# Patient Record
Sex: Female | Born: 1980
Health system: Southern US, Community
[De-identification: ages and names within clinical notes are randomized; demographics above are authoritative.]

## PROBLEM LIST (undated history)

## (undated) ENCOUNTER — Inpatient Hospital Stay (HOSPITAL_COMMUNITY): Payer: Self-pay

## (undated) DIAGNOSIS — K760 Fatty (change of) liver, not elsewhere classified: Secondary | ICD-10-CM

## (undated) DIAGNOSIS — O169 Unspecified maternal hypertension, unspecified trimester: Secondary | ICD-10-CM

## (undated) DIAGNOSIS — E119 Type 2 diabetes mellitus without complications: Secondary | ICD-10-CM

## (undated) DIAGNOSIS — U071 COVID-19: Secondary | ICD-10-CM

## (undated) DIAGNOSIS — N12 Tubulo-interstitial nephritis, not specified as acute or chronic: Secondary | ICD-10-CM

## (undated) DIAGNOSIS — J1282 Pneumonia due to coronavirus disease 2019: Secondary | ICD-10-CM

## (undated) DIAGNOSIS — Z86718 Personal history of other venous thrombosis and embolism: Secondary | ICD-10-CM

## (undated) DIAGNOSIS — B9620 Unspecified Escherichia coli [E. coli] as the cause of diseases classified elsewhere: Secondary | ICD-10-CM

## (undated) DIAGNOSIS — I10 Essential (primary) hypertension: Secondary | ICD-10-CM

## (undated) DIAGNOSIS — E1110 Type 2 diabetes mellitus with ketoacidosis without coma: Secondary | ICD-10-CM

## (undated) DIAGNOSIS — R7881 Bacteremia: Secondary | ICD-10-CM

## (undated) DIAGNOSIS — B370 Candidal stomatitis: Secondary | ICD-10-CM

## (undated) DIAGNOSIS — I82461 Acute embolism and thrombosis of right calf muscular vein: Secondary | ICD-10-CM

## (undated) HISTORY — DX: Fatty (change of) liver, not elsewhere classified: K76.0

## (undated) HISTORY — DX: Type 2 diabetes mellitus with ketoacidosis without coma: E11.10

## (undated) HISTORY — PX: NO PAST SURGERIES: SHX2092

## (undated) HISTORY — DX: Acute embolism and thrombosis of right calf muscular vein: I82.461

## (undated) HISTORY — DX: Pneumonia due to coronavirus disease 2019: J12.82

## (undated) HISTORY — DX: Tubulo-interstitial nephritis, not specified as acute or chronic: N12

## (undated) HISTORY — DX: Bacteremia: B96.20

## (undated) HISTORY — DX: Unspecified maternal hypertension, unspecified trimester: O16.9

## (undated) HISTORY — DX: COVID-19: U07.1

## (undated) HISTORY — DX: Personal history of other venous thrombosis and embolism: Z86.718

## (undated) HISTORY — DX: Essential (primary) hypertension: I10

## (undated) HISTORY — DX: Candidal stomatitis: B37.0

## (undated) HISTORY — DX: Bacteremia: R78.81

---

## 2010-06-29 ENCOUNTER — Emergency Department (HOSPITAL_COMMUNITY)
Admission: EM | Admit: 2010-06-29 | Discharge: 2010-06-30 | Payer: Self-pay | Source: Home / Self Care | Admitting: Emergency Medicine

## 2010-09-24 LAB — DIFFERENTIAL
Basophils Absolute: 0 10*3/uL (ref 0.0–0.1)
Basophils Relative: 0 % (ref 0–1)
Eosinophils Absolute: 0.4 10*3/uL (ref 0.0–0.7)
Eosinophils Relative: 4 % (ref 0–5)
Lymphocytes Relative: 24 % (ref 12–46)
Lymphs Abs: 2 10*3/uL (ref 0.7–4.0)
Monocytes Absolute: 0.6 10*3/uL (ref 0.1–1.0)
Monocytes Relative: 7 % (ref 3–12)
Neutro Abs: 5.4 10*3/uL (ref 1.7–7.7)
Neutrophils Relative %: 64 % (ref 43–77)

## 2010-09-24 LAB — CBC
HCT: 38.2 % (ref 36.0–46.0)
Hemoglobin: 12.7 g/dL (ref 12.0–15.0)
MCH: 29.3 pg (ref 26.0–34.0)
MCHC: 33.2 g/dL (ref 30.0–36.0)
MCV: 88 fL (ref 78.0–100.0)
Platelets: 206 10*3/uL (ref 150–400)
RBC: 4.34 MIL/uL (ref 3.87–5.11)
RDW: 12.6 % (ref 11.5–15.5)
WBC: 8.3 10*3/uL (ref 4.0–10.5)

## 2010-09-24 LAB — URINALYSIS, ROUTINE W REFLEX MICROSCOPIC
Bilirubin Urine: NEGATIVE
Glucose, UA: NEGATIVE mg/dL
Hgb urine dipstick: NEGATIVE
Ketones, ur: NEGATIVE mg/dL
Nitrite: NEGATIVE
Protein, ur: NEGATIVE mg/dL
Specific Gravity, Urine: 1.02 (ref 1.005–1.030)
Urobilinogen, UA: 0.2 mg/dL (ref 0.0–1.0)
pH: 6.5 (ref 5.0–8.0)

## 2010-09-24 LAB — POCT I-STAT, CHEM 8
BUN: 20 mg/dL (ref 6–23)
Calcium, Ion: 1.16 mmol/L (ref 1.12–1.32)
Chloride: 105 mEq/L (ref 96–112)
Creatinine, Ser: 0.8 mg/dL (ref 0.4–1.2)
Glucose, Bld: 114 mg/dL — ABNORMAL HIGH (ref 70–99)
HCT: 38 % (ref 36.0–46.0)
Hemoglobin: 12.9 g/dL (ref 12.0–15.0)
Potassium: 3.6 mEq/L (ref 3.5–5.1)
Sodium: 142 mEq/L (ref 135–145)
TCO2: 30 mmol/L (ref 0–100)

## 2010-09-24 LAB — LIPASE, BLOOD: Lipase: 39 U/L (ref 11–59)

## 2010-09-24 LAB — HEPATIC FUNCTION PANEL
ALT: 28 U/L (ref 0–35)
AST: 43 U/L — ABNORMAL HIGH (ref 0–37)
Albumin: 3.8 g/dL (ref 3.5–5.2)
Alkaline Phosphatase: 63 U/L (ref 39–117)
Bilirubin, Direct: 0.1 mg/dL (ref 0.0–0.3)
Indirect Bilirubin: 0.4 mg/dL (ref 0.3–0.9)
Total Bilirubin: 0.5 mg/dL (ref 0.3–1.2)
Total Protein: 6.6 g/dL (ref 6.0–8.3)

## 2010-09-24 LAB — POCT PREGNANCY, URINE: Preg Test, Ur: NEGATIVE

## 2011-07-16 NOTE — L&D Delivery Note (Signed)
Delivery Note At 4:02 PM a viable female was delivered via Vaginal, Spontaneous Delivery (Presentation: ; Occiput Anterior).  APGAR: 8, 9; weight 3305 g.   Placenta status: Intact, Spontaneous.  Cord: 3 vessels with the following complications: nuchal cord x 1 - delivered through and reduced after delivery.    Anesthesia: Epidural  Episiotomy: None Lacerations: 2nd degree Suture Repair: 3.0 vicryl Est. Blood Loss (mL): 400  Mom to postpartum.  Baby to nursery-stable.  Mat Carne 02/19/2012, 4:45 PM

## 2011-07-16 NOTE — L&D Delivery Note (Signed)
I supervised Dr Clinton Sawyer during this delivery.  I agree with the above note.  Levie Heritage, DO 02/20/2012 7:54 PM

## 2011-07-18 ENCOUNTER — Other Ambulatory Visit: Payer: Self-pay

## 2011-07-18 DIAGNOSIS — Z331 Pregnant state, incidental: Secondary | ICD-10-CM

## 2011-07-18 LAB — HIV ANTIBODY (ROUTINE TESTING W REFLEX): HIV: NONREACTIVE

## 2011-07-18 NOTE — Progress Notes (Signed)
Prenatal labs done today [employer] 

## 2011-07-19 LAB — OBSTETRIC PANEL
Antibody Screen: NEGATIVE
Basophils Absolute: 0 10*3/uL (ref 0.0–0.1)
Basophils Relative: 0 % (ref 0–1)
Eosinophils Absolute: 0.1 10*3/uL (ref 0.0–0.7)
Eosinophils Relative: 1 % (ref 0–5)
HCT: 42.2 % (ref 36.0–46.0)
Hemoglobin: 13.7 g/dL (ref 12.0–15.0)
Hepatitis B Surface Ag: NEGATIVE
Lymphocytes Relative: 32 % (ref 12–46)
Lymphs Abs: 3 10*3/uL (ref 0.7–4.0)
MCH: 29 pg (ref 26.0–34.0)
MCHC: 32.5 g/dL (ref 30.0–36.0)
MCV: 89.2 fL (ref 78.0–100.0)
Monocytes Absolute: 0.7 10*3/uL (ref 0.1–1.0)
Monocytes Relative: 8 % (ref 3–12)
Neutro Abs: 5.5 10*3/uL (ref 1.7–7.7)
Neutrophils Relative %: 59 % (ref 43–77)
Platelets: 216 10*3/uL (ref 150–400)
RBC: 4.73 MIL/uL (ref 3.87–5.11)
RDW: 13 % (ref 11.5–15.5)
Rh Type: POSITIVE
Rubella: 42.8 IU/mL — ABNORMAL HIGH
WBC: 9.4 10*3/uL (ref 4.0–10.5)

## 2011-07-19 LAB — SICKLE CELL SCREEN: Sickle Cell Screen: NEGATIVE

## 2011-07-21 LAB — CULTURE, OB URINE: Colony Count: 25000

## 2011-07-25 ENCOUNTER — Encounter: Payer: Self-pay | Admitting: Family Medicine

## 2011-07-25 ENCOUNTER — Ambulatory Visit (INDEPENDENT_AMBULATORY_CARE_PROVIDER_SITE_OTHER): Payer: Self-pay | Admitting: Family Medicine

## 2011-07-25 VITALS — BP 130/82 | Wt 238.0 lb

## 2011-07-25 DIAGNOSIS — Z331 Pregnant state, incidental: Secondary | ICD-10-CM

## 2011-07-25 DIAGNOSIS — E039 Hypothyroidism, unspecified: Secondary | ICD-10-CM

## 2011-07-25 DIAGNOSIS — Z3480 Encounter for supervision of other normal pregnancy, unspecified trimester: Secondary | ICD-10-CM

## 2011-07-25 LAB — T3, FREE: T3, Free: 2.6 pg/mL (ref 2.3–4.2)

## 2011-07-25 LAB — TSH: TSH: 1.698 u[IU]/mL (ref 0.350–4.500)

## 2011-07-26 LAB — GC/CHLAMYDIA PROBE AMP, GENITAL
Chlamydia, DNA Probe: NEGATIVE
GC Probe Amp, Genital: NEGATIVE

## 2011-08-07 ENCOUNTER — Encounter: Payer: Self-pay | Admitting: Family Medicine

## 2011-08-07 DIAGNOSIS — E669 Obesity, unspecified: Secondary | ICD-10-CM | POA: Insufficient documentation

## 2011-08-07 NOTE — Progress Notes (Signed)
CC: initial pre-natal visit  HPI:  Learned of pregnancy on December 5th, patient very excited about pregnancy PE:  Gen - alert, oriented, very pleasant  Cardiac - RRR  Lungs - CTA  Abdomen - obese, not discernible gravid, non-tender, uterine fundus not palpable  Plan  1. Hypothyroidism - check TSH, T3/T4 today to make sure on correct dose of levothyroxine   2. Pregnancy - 1st trimester, follow-up with OB clinic in 4 weeks  3. Obesity - needs early glucola screening at next visit

## 2011-08-15 ENCOUNTER — Encounter (HOSPITAL_COMMUNITY): Payer: Self-pay | Admitting: *Deleted

## 2011-08-15 ENCOUNTER — Inpatient Hospital Stay (HOSPITAL_COMMUNITY): Payer: Self-pay

## 2011-08-15 ENCOUNTER — Inpatient Hospital Stay (HOSPITAL_COMMUNITY)
Admission: AD | Admit: 2011-08-15 | Discharge: 2011-08-15 | Disposition: A | Discharge status: Home / Self Care | Payer: Self-pay | Source: Ambulatory Visit | Attending: Obstetrics and Gynecology | Admitting: Obstetrics and Gynecology

## 2011-08-15 DIAGNOSIS — O209 Hemorrhage in early pregnancy, unspecified: Secondary | ICD-10-CM | POA: Insufficient documentation

## 2011-08-15 LAB — URINE MICROSCOPIC-ADD ON

## 2011-08-15 LAB — URINALYSIS, ROUTINE W REFLEX MICROSCOPIC
Bilirubin Urine: NEGATIVE
Glucose, UA: NEGATIVE mg/dL
Ketones, ur: NEGATIVE mg/dL
Leukocytes, UA: NEGATIVE
Nitrite: NEGATIVE
Protein, ur: NEGATIVE mg/dL
Specific Gravity, Urine: 1.015 (ref 1.005–1.030)
Urobilinogen, UA: 0.2 mg/dL (ref 0.0–1.0)
pH: 6 (ref 5.0–8.0)

## 2011-08-15 LAB — WET PREP, GENITAL
Clue Cells Wet Prep HPF POC: NONE SEEN
Trich, Wet Prep: NONE SEEN
WBC, Wet Prep HPF POC: NONE SEEN
Yeast Wet Prep HPF POC: NONE SEEN

## 2011-08-15 NOTE — Progress Notes (Signed)
SSE per CNM.  Wet prep and cultures collected.  VE done.  

## 2011-08-15 NOTE — Progress Notes (Signed)
N. Frazier, CNM at bedside.  Assessment done and poc discussed with pt.  

## 2011-08-15 NOTE — Progress Notes (Signed)
Per interpreter, pt reports she had vaginal bleeding  Earlier today. Now having lower back pain. EDD 02/20/2012

## 2011-08-15 NOTE — ED Provider Notes (Signed)
History     No chief complaint on file.  HPI 31 y.o. G2P0010 at [redacted]w[redacted]d c/o vaginal bleeding like a period starting today, mild low back pain. Prenatal care at [facility]. H/O SAB at 8 weeks.    Past Medical History  Diagnosis Date  . Hypothyroidism     No past surgical history on file.  No family history on file.  History  Substance Use Topics  . Smoking status: Not on file  . Smokeless tobacco: Not on file  . Alcohol Use:     Allergies: Allergies not on file  Prescriptions prior to admission  Medication Sig Dispense Refill  . levothyroxine (SYNTHROID, LEVOTHROID) 25 MCG tablet Take 25 mcg by mouth daily.        Review of Systems  Constitutional: Negative.   Respiratory: Negative.   Cardiovascular: Negative.   Gastrointestinal: Negative for nausea, vomiting, abdominal pain, diarrhea and constipation.  Genitourinary: Negative for dysuria, urgency, frequency, hematuria and flank pain.       Positive for vaginal bleeding  Musculoskeletal: Positive for back pain.  Neurological: Negative.   Psychiatric/Behavioral: Negative.    Physical Exam   Last menstrual period 05/12/2011.  Physical Exam  Nursing note and vitals reviewed. Constitutional: She is oriented to person, place, and time. She appears well-developed and well-nourished. No distress.  HENT:  Head: Normocephalic and atraumatic.  Cardiovascular: Normal rate, regular rhythm and normal heart sounds.   Respiratory: Effort normal and breath sounds normal. No respiratory distress.  GI: Soft. Bowel sounds are normal. She exhibits no distension and no mass. There is no tenderness. There is no rebound and no guarding.  Genitourinary: There is no rash or lesion on the right labia. There is no rash or lesion on the left labia. Uterus is not deviated, not enlarged, not fixed and not tender. Cervix exhibits no motion tenderness, no discharge and no friability. Right adnexum displays no mass, no tenderness and no fullness. Left  adnexum displays no mass, no tenderness and no fullness. There is bleeding (small clot, no active bleeding) around the vagina. No erythema or tenderness around the vagina. No vaginal discharge found.       Cervix closed and firm  Neurological: She is alert and oriented to person, place, and time.  Skin: Skin is warm and dry.  Psychiatric: She has a normal mood and affect.    MAU Course  Procedures  Results for orders placed during the hospital encounter of 08/15/11 (from the past 24 hour(s))  URINALYSIS, ROUTINE W REFLEX MICROSCOPIC     Status: Abnormal   Collection Time   08/15/11  1:25 AM      Component Value Range   Color, Urine YELLOW  YELLOW    APPearance CLEAR  CLEAR    Specific Gravity, Urine 1.015  1.005 - 1.030    pH 6.0  5.0 - 8.0    Glucose, UA NEGATIVE  NEGATIVE (mg/dL)   Hgb urine dipstick LARGE (*) NEGATIVE    Bilirubin Urine NEGATIVE  NEGATIVE    Ketones, ur NEGATIVE  NEGATIVE (mg/dL)   Protein, ur NEGATIVE  NEGATIVE (mg/dL)   Urobilinogen, UA 0.2  0.0 - 1.0 (mg/dL)   Nitrite NEGATIVE  NEGATIVE    Leukocytes, UA NEGATIVE  NEGATIVE   WET PREP, GENITAL     Status: Normal   Collection Time   08/15/11  1:25 AM      Component Value Range   Yeast Wet Prep HPF POC NONE SEEN  NONE SEEN  Trich, Wet Prep NONE SEEN  NONE SEEN    Clue Cells Wet Prep HPF POC NONE SEEN  NONE SEEN    WBC, Wet Prep HPF POC NONE SEEN  NONE SEEN   URINE MICROSCOPIC-ADD ON     Status: Normal   Collection Time   08/15/11  1:25 AM      Component Value Range   Squamous Epithelial / LPF RARE  RARE    WBC, UA 0-2  <3 (WBC/hpf)   RBC / HPF 11-20  <3 (RBC/hpf)   US Ob Comp Less 14 Wks  08/15/2011  *RADIOLOGY REPORT*  Clinical Data: Pregnant, bleeding, 13 weeks 4 days EGA by LMP  OBSTETRIC <14 WK ULTRASOUND  Technique:  Transabdominal ultrasound was performed for evaluation of the gestation as well as the maternal uterus and adnexal regions.  Comparison:  None  Intrauterine gestational sac:  Visualized/normal in shape. Yolk sac: Not identified Embryo: Present Cardiac Activity: Present Heart Rate: 142 bpm  CRL:  5.5 cm mm    12 w   1 d       [place] EDC: 02/26/2012  Maternal uterus/Adnexae: No subchorionic hemorrhage. Ovaries were not visualized, question related to position or obscuration by bowel. No free fluid or adnexal masses noted.  IMPRESSION: Single live intrauterine gestation measured at 12 weeks 1 day EGA by crown-rump length. No acute abnormalities.  Original Report Authenticated By: Lollie Marrow, M.D.    Assessment and Plan  30 y.o. G2P0010 at [redacted]w[redacted]d Bleeding in early pregnancy  Precautions rev'd F/U as scheduled   Anelis Hrivnak 08/15/2011, 12:57 AM

## 2011-08-16 LAB — GC/CHLAMYDIA PROBE AMP, GENITAL
Chlamydia, DNA Probe: NEGATIVE
GC Probe Amp, Genital: NEGATIVE

## 2011-08-16 NOTE — ED Provider Notes (Signed)
Attestation of Attending Supervision of Advanced Practitioner: Evaluation and management procedures were performed by the PA/NP/CNM/OB Fellow under my supervision/collaboration. Chart reviewed and agree with management and plan.  Orli Degrave V 08/16/2011 9:09 AM

## 2011-08-29 ENCOUNTER — Ambulatory Visit (INDEPENDENT_AMBULATORY_CARE_PROVIDER_SITE_OTHER): Payer: Self-pay | Admitting: Family Medicine

## 2011-08-29 ENCOUNTER — Encounter: Payer: Self-pay | Admitting: Family Medicine

## 2011-08-29 VITALS — BP 118/84 | Temp 99.0°F | Wt 237.0 lb

## 2011-08-29 DIAGNOSIS — Z3480 Encounter for supervision of other normal pregnancy, unspecified trimester: Secondary | ICD-10-CM

## 2011-08-29 DIAGNOSIS — Z23 Encounter for immunization: Secondary | ICD-10-CM

## 2011-08-29 DIAGNOSIS — Z331 Pregnant state, incidental: Secondary | ICD-10-CM

## 2011-08-29 LAB — GLUCOSE, CAPILLARY: Glucose-Capillary: 140 mg/dL — ABNORMAL HIGH (ref 70–99)

## 2011-08-29 NOTE — Patient Instructions (Signed)
Everything looks good today. Please let us know if you get the orange card. If you have bad pain, if you have bleeding, or if you have lots of vomiting, please call us. Let us know if you have any questions.  Todo se ve bien en la actualidad. Por favor, hganos saber si usted recibe la tarjeta naranja. Si usted tiene [employer], si tiene sangrado, o si usted tiene un montn de vmito, por favor llmenos. Hganos saber si usted tiene alguna pregunta.

## 2011-08-29 NOTE — Progress Notes (Signed)
30 G2P0 here at 14 1/7 (redated today by her 12 week sono) for routine OB visit.  She c/o mild nausea that is resolved with eating.  She reports she had one episode of bleeding and went to the MAU.  Everything was fine, no bleeding since then.   See flow sheet for details.  FHT not auscultated with doppler, but fetal movement and cardiac activity observed with bedside sono.   A/P: Pregnancy - now doing well.  No further bleeding.  Pain, bleeding precautions reviewed.  Will need anatomy scan scheduled for around 19 weeks.  She will apply for orange card and let us know the outcome. Flu shot today. Nausea - small frequent meals.  Has lost 1 lb, will need to follow.  Weight gain goal only 15 lbs for [name] Glucose intolerance - 1 hour 140 today.  Will need 3 hour.  Has scheduled for 09/02/11. Obesity - weight gain goal 15 lbs.   Hypothryoid - TSH at goal.  Continue current dose.  Check TSH next visit. Follow up 4 weeks with Dr. Clinton Sawyer.

## 2011-09-02 ENCOUNTER — Other Ambulatory Visit: Payer: Self-pay

## 2011-09-02 DIAGNOSIS — Z331 Pregnant state, incidental: Secondary | ICD-10-CM

## 2011-09-02 LAB — GLUCOSE, CAPILLARY: Glucose-Capillary: 94 mg/dL (ref 70–99)

## 2011-09-02 NOTE — Progress Notes (Signed)
3 HR GTT DONE TODAY Tonya Simmons 

## 2011-09-03 LAB — GLUCOSE TOLERANCE, 3 HOURS
Glucose Tolerance, 1 hour: 177 mg/dL (ref 70–189)
Glucose Tolerance, 2 hour: 151 mg/dL (ref 70–164)
Glucose Tolerance, Fasting: 85 mg/dL (ref 70–104)
Glucose, GTT - 3 Hour: 112 mg/dL (ref 70–144)

## 2011-09-23 ENCOUNTER — Telehealth: Payer: Self-pay | Admitting: Family Medicine

## 2011-09-23 NOTE — Telephone Encounter (Signed)
Pt called to let us know that she has OC. Pt need [place] appt.  [employer]

## 2011-09-30 ENCOUNTER — Ambulatory Visit (INDEPENDENT_AMBULATORY_CARE_PROVIDER_SITE_OTHER): Payer: Self-pay | Admitting: Family Medicine

## 2011-09-30 VITALS — BP 117/89 | Wt 241.0 lb

## 2011-09-30 DIAGNOSIS — Z3480 Encounter for supervision of other normal pregnancy, unspecified trimester: Secondary | ICD-10-CM

## 2011-09-30 DIAGNOSIS — E039 Hypothyroidism, unspecified: Secondary | ICD-10-CM

## 2011-09-30 NOTE — Progress Notes (Signed)
Addended by: Garnetta Buddy on: 09/30/2011 06:03 PM   Modules accepted: Level of Service

## 2011-09-30 NOTE — Progress Notes (Signed)
31 yr old G39P0 @ 18.5 weeks (dated by 12 week sono) for routine OB visit.  S: Complains of pain right lower quadrant - moderate, exacerbated by sitting and standing up, relapses and remits, has not taken an analgesic; denies bleeding, vaginal discharge, and contractions; Asked about blood sugar and shared with her that results from screen were normal ; O: BP 117/89  Wt 241 lb (109.317 kg)  LMP 05/12/2011  Gen: alert, oriented, non-distressed  Abdomen: obese, fundal height 19 cm, FHT not auscultated with doppler, so bedside sono that demonstrated fetal movement and cardiac activity A/P: 31 y.o. G2P0 at 18.[redacted] week EGA with complications of obesity and hypothyroidism, but otherwise healthy - TSH checked today, adjust medication accordingly - Obesity - 3 lb weight gain since initial prenatal visit - Scheduled complete u/s for 3/22 at [facility] hospital - f/u 4 weeks

## 2011-10-01 ENCOUNTER — Encounter: Payer: Self-pay | Admitting: Family Medicine

## 2011-10-01 LAB — TSH: TSH: 2.008 u[IU]/mL (ref 0.350–4.500)

## 2011-10-04 ENCOUNTER — Ambulatory Visit (HOSPITAL_COMMUNITY)
Admission: RE | Admit: 2011-10-04 | Discharge: 2011-10-04 | Disposition: A | Discharge status: Home / Self Care | Payer: Self-pay | Source: Ambulatory Visit | Attending: Family Medicine | Admitting: Family Medicine

## 2011-10-04 DIAGNOSIS — Z3689 Encounter for other specified antenatal screening: Secondary | ICD-10-CM | POA: Insufficient documentation

## 2011-10-04 DIAGNOSIS — E669 Obesity, unspecified: Secondary | ICD-10-CM | POA: Insufficient documentation

## 2011-10-04 DIAGNOSIS — Z3480 Encounter for supervision of other normal pregnancy, unspecified trimester: Secondary | ICD-10-CM

## 2011-10-04 DIAGNOSIS — O99210 Obesity complicating pregnancy, unspecified trimester: Secondary | ICD-10-CM | POA: Insufficient documentation

## 2011-10-31 ENCOUNTER — Ambulatory Visit (INDEPENDENT_AMBULATORY_CARE_PROVIDER_SITE_OTHER): Payer: Self-pay | Admitting: Family Medicine

## 2011-10-31 VITALS — BP 140/95 | Wt 245.5 lb

## 2011-10-31 DIAGNOSIS — IMO0002 Reserved for concepts with insufficient information to code with codable children: Secondary | ICD-10-CM

## 2011-10-31 DIAGNOSIS — O139 Gestational [pregnancy-induced] hypertension without significant proteinuria, unspecified trimester: Secondary | ICD-10-CM

## 2011-10-31 DIAGNOSIS — Z3480 Encounter for supervision of other normal pregnancy, unspecified trimester: Secondary | ICD-10-CM

## 2011-10-31 LAB — POCT URINALYSIS DIPSTICK
Bilirubin, UA: NEGATIVE
Blood, UA: NEGATIVE
Glucose, UA: NEGATIVE
Ketones, UA: NEGATIVE
Leukocytes, UA: NEGATIVE
Nitrite, UA: NEGATIVE
Protein, UA: NEGATIVE
Spec Grav, UA: 1.01
Urobilinogen, UA: 0.2
pH, UA: 7

## 2011-10-31 LAB — CBC
HCT: 36.6 % (ref 36.0–46.0)
Hemoglobin: 12.2 g/dL (ref 12.0–15.0)
MCH: 30 pg (ref 26.0–34.0)
MCHC: 33.3 g/dL (ref 30.0–36.0)
MCV: 90.1 fL (ref 78.0–100.0)
Platelets: 202 10*3/uL (ref 150–400)
RBC: 4.06 MIL/uL (ref 3.87–5.11)
RDW: 13.5 % (ref 11.5–15.5)
WBC: 9.6 10*3/uL (ref 4.0–10.5)

## 2011-10-31 NOTE — Progress Notes (Signed)
31 year old G2P0 @ 23.1 weeks (dated by 12 week sono) for routine OB visit. Sex female on 18 week u/s.  S: no complaints; denies vag bleeding and contractions; feels baby move a lot; denies headache, blurry vision, RUQ pain O: BP 140/95  Wt 245 lb 8 oz (111.358 kg)  LMP 05/12/2011 Elevated BP initially; rechecked and 130/84 bilaterally  Gen: alert, non-distressed  Lungs: CTA-B  Cardiac: RRR  Abd: obese, gravid  Neuro: PERRLA, EOMI, normal DTR  Extremities: no edema A/P: 31 year old G2P0 @ 64 and 1/7 weeks obese female with elevated BP, who needs a r/o for pre-eclampsia.   - Check CBC, CMP, 24 hours urine, and TSH  - F/u in 1 week for rpt BP check

## 2011-11-01 ENCOUNTER — Other Ambulatory Visit: Payer: Self-pay

## 2011-11-01 DIAGNOSIS — O139 Gestational [pregnancy-induced] hypertension without significant proteinuria, unspecified trimester: Secondary | ICD-10-CM

## 2011-11-01 LAB — COMPLETE METABOLIC PANEL WITH GFR
ALT: 25 U/L (ref 0–35)
AST: 18 U/L (ref 0–37)
Albumin: 4.1 g/dL (ref 3.5–5.2)
Alkaline Phosphatase: 49 U/L (ref 39–117)
BUN: 7 mg/dL (ref 6–23)
CO2: 22 mEq/L (ref 19–32)
Calcium: 9.4 mg/dL (ref 8.4–10.5)
Chloride: 106 mEq/L (ref 96–112)
Creat: 0.61 mg/dL (ref 0.50–1.10)
GFR, Est African American: >89 mL/min
GFR, Est Non African American: >89 mL/min
Glucose, Bld: 82 mg/dL (ref 70–99)
Potassium: 4 mEq/L (ref 3.5–5.3)
Sodium: 138 mEq/L (ref 135–145)
Total Bilirubin: 0.2 mg/dL — ABNORMAL LOW (ref 0.3–1.2)
Total Protein: 6.7 g/dL (ref 6.0–8.3)

## 2011-11-01 LAB — TSH: TSH: 2.125 u[IU]/mL (ref 0.350–4.500)

## 2011-11-02 LAB — PROTEIN, URINE, 24 HOUR: Protein, Urine: <3 mg/dL

## 2011-11-04 ENCOUNTER — Telehealth: Payer: Self-pay | Admitting: Family Medicine

## 2011-11-04 NOTE — Telephone Encounter (Signed)
I spoke with the patient and informed her that her test results were normal. She has had no problems with headache, changes in vision, or abdominal pain this week. She will return to clinic on Wednesday for a blood pressure check.

## 2011-11-06 ENCOUNTER — Ambulatory Visit (INDEPENDENT_AMBULATORY_CARE_PROVIDER_SITE_OTHER): Payer: Self-pay | Admitting: Family Medicine

## 2011-11-06 DIAGNOSIS — Z3480 Encounter for supervision of other normal pregnancy, unspecified trimester: Secondary | ICD-10-CM

## 2011-11-06 NOTE — Patient Instructions (Signed)
Por favor regrese en 2 semanas para chequear su presion.

## 2011-11-06 NOTE — Progress Notes (Signed)
31 year old G2P0 @ 24.0 weeks (dated by 12 week sono) for f/u of BP. S: patient denies headache, blurry vision, abdominal pain, edema O: BP 126/93  Temp 98.1 F (36.7 C)  Wt 244 lb (110.678 kg)  LMP 05/12/2011 - Fetal HR 136Gen: alert, non-distressed  Lungs: CTA-B  Cardiac: RRR  Abd: obese, gravid  Neuro: PERRLA, EOMI, normal DTR  Extremities: no edema  CMP within normal limits; CBC within normal limits; 24 hour urine protein < 50 mg/day; TSH WNL  A/P: 31 y.o. G3P0 at [redacted] weeks EGA who has borderline elevated BP, but no pre-eclampsia.  - F/u in 2 weeks for repeat BP check - Check TSH at 28 week visit along with TSH, RPR, HIV, CBC

## 2011-11-21 ENCOUNTER — Encounter: Payer: Self-pay | Admitting: Family Medicine

## 2011-11-28 ENCOUNTER — Ambulatory Visit (INDEPENDENT_AMBULATORY_CARE_PROVIDER_SITE_OTHER): Payer: Self-pay | Admitting: Family Medicine

## 2011-11-28 VITALS — BP 122/94 | Wt 245.0 lb

## 2011-11-28 DIAGNOSIS — Z3480 Encounter for supervision of other normal pregnancy, unspecified trimester: Secondary | ICD-10-CM

## 2011-11-28 DIAGNOSIS — Z331 Pregnant state, incidental: Secondary | ICD-10-CM

## 2011-11-28 DIAGNOSIS — E039 Hypothyroidism, unspecified: Secondary | ICD-10-CM

## 2011-11-28 LAB — HIV ANTIBODY (ROUTINE TESTING W REFLEX): HIV: NONREACTIVE

## 2011-11-28 LAB — CBC
HCT: 38 % (ref 36.0–46.0)
Hemoglobin: 12.6 g/dL (ref 12.0–15.0)
MCH: 29.8 pg (ref 26.0–34.0)
MCHC: 33.2 g/dL (ref 30.0–36.0)
MCV: 89.8 fL (ref 78.0–100.0)
Platelets: 202 10*3/uL (ref 150–400)
RBC: 4.23 MIL/uL (ref 3.87–5.11)
RDW: 13.5 % (ref 11.5–15.5)
WBC: 8.8 10*3/uL (ref 4.0–10.5)

## 2011-11-28 LAB — RPR

## 2011-11-28 LAB — TSH: TSH: 1.394 u[IU]/mL (ref 0.350–4.500)

## 2011-11-28 NOTE — Patient Instructions (Signed)
It was good to meet you today  We will check some blood work today  I would also like to refer you to the high risk OB clinic If you develop any severe abdominal pain, vaginal bleeding, decreased fetal movement, or any other concerning symptoms, [facility]  Call if any questions,  God Bless,  Doree Albee MD

## 2011-11-28 NOTE — Progress Notes (Signed)
30 YO G2P1 here at 27 and 1 weeks here for routine prenatal visit. No acute issue or concern.  Plan:   TSH, RPR, CBC, HIV Will formally refer to high risk given multiple risk factors including hypoTSH and ? Gestational HTN.  Case reviewed with Dr. Jolayne Panther and Dr. Leveda Anna.

## 2011-12-11 ENCOUNTER — Ambulatory Visit (INDEPENDENT_AMBULATORY_CARE_PROVIDER_SITE_OTHER): Payer: Self-pay | Admitting: Family Medicine

## 2011-12-11 ENCOUNTER — Encounter: Payer: Self-pay | Admitting: Family Medicine

## 2011-12-11 ENCOUNTER — Other Ambulatory Visit: Payer: Self-pay | Admitting: Family Medicine

## 2011-12-11 DIAGNOSIS — E079 Disorder of thyroid, unspecified: Secondary | ICD-10-CM

## 2011-12-11 DIAGNOSIS — O169 Unspecified maternal hypertension, unspecified trimester: Secondary | ICD-10-CM | POA: Insufficient documentation

## 2011-12-11 DIAGNOSIS — Z8639 Personal history of other endocrine, nutritional and metabolic disease: Secondary | ICD-10-CM | POA: Insufficient documentation

## 2011-12-11 DIAGNOSIS — O139 Gestational [pregnancy-induced] hypertension without significant proteinuria, unspecified trimester: Secondary | ICD-10-CM

## 2011-12-11 DIAGNOSIS — O99280 Endocrine, nutritional and metabolic diseases complicating pregnancy, unspecified trimester: Secondary | ICD-10-CM | POA: Insufficient documentation

## 2011-12-11 DIAGNOSIS — O0990 Supervision of high risk pregnancy, unspecified, unspecified trimester: Secondary | ICD-10-CM | POA: Insufficient documentation

## 2011-12-11 DIAGNOSIS — E039 Hypothyroidism, unspecified: Secondary | ICD-10-CM

## 2011-12-11 HISTORY — DX: Unspecified maternal hypertension, unspecified trimester: O16.9

## 2011-12-11 LAB — COMPREHENSIVE METABOLIC PANEL
ALT: 16 U/L (ref 0–35)
AST: 14 U/L (ref 0–37)
Albumin: 3.7 g/dL (ref 3.5–5.2)
Alkaline Phosphatase: 60 U/L (ref 39–117)
BUN: 7 mg/dL (ref 6–23)
CO2: 20 mEq/L (ref 19–32)
Calcium: 8.8 mg/dL (ref 8.4–10.5)
Chloride: 104 mEq/L (ref 96–112)
Creat: 0.6 mg/dL (ref 0.50–1.10)
Glucose, Bld: 177 mg/dL — ABNORMAL HIGH (ref 70–99)
Potassium: 4 mEq/L (ref 3.5–5.3)
Sodium: 135 mEq/L (ref 135–145)
Total Bilirubin: 0.3 mg/dL (ref 0.3–1.2)
Total Protein: 6.2 g/dL (ref 6.0–8.3)

## 2011-12-11 LAB — POCT URINALYSIS DIP (DEVICE)
Bilirubin Urine: NEGATIVE
Glucose, UA: NEGATIVE mg/dL
Ketones, ur: NEGATIVE mg/dL
Nitrite: NEGATIVE
Protein, ur: 30 mg/dL — AB
Specific Gravity, Urine: 1.015 (ref 1.005–1.030)
Urobilinogen, UA: 0.2 mg/dL (ref 0.0–1.0)
pH: 7 (ref 5.0–8.0)

## 2011-12-11 NOTE — Progress Notes (Signed)
Addended by: Levie Heritage on: 12/11/2011 01:27 PM   Modules accepted: Orders

## 2011-12-11 NOTE — Progress Notes (Signed)
P=87, Patient states transferred to [place] from [facility] due to thyroid disease and HTN. Used Interpreter Morene Antu. C/o abdominal pain like she has worked out sometimes( but isn't working out, is resting)

## 2011-12-11 NOTE — Progress Notes (Signed)
Patient referred to [facility] for GHTN and hypothyroidism.  Has no concerns today.  Denies HA, vision changes, nausea, abdominal pain, vaginal bleeding, vaginal discharge. Will collect 24hr urine. 1hr gtt today.

## 2011-12-11 NOTE — Patient Instructions (Signed)
Hipertensin durante el embarazo  (Hypertension During Pregnancy)  La hipertensin tambin se denomina presin arterial alta. La presin arterial hace circular la sangre por el organismo. En algunos casos, la fuerza que moviliza la sangre se hace muy intensa. Durante el embarazo, este problema debe controlarse con mucha atencin. Puede causar problemas para usted y su beb.  CUIDADOS EN EL HOGAR   Cumpla con todos los controles mdicos.   Tome los medicamentos como le indic el mdico. Dgale a su mdico sobre todos los medicamentos que toma.   Coma muy poca sal.   Haga ejercicios regularmente.   No beba alcohol.   No fume.   No tome bebidas con cafena.   Acustese sobre su lado izquierdo cuando haga reposo.  SOLICITE AYUDA DE INMEDIATO SI:  Siente un dolor en el vientre (abdominal) muy intenso.   Observa hinchazn repentina de las manos, tobillos o el rostro.   Aumenta ms de 4 libras (1.8 kilogramos) en una semana.   Vomita varias veces.   Tiene hemorragia vaginal.   No siente los movimientos del beb.   Le duele la cabeza.   Tiene visin borrosa o doble.   Tiene tics o espasmos musculares.   Le falta el aire.   Las uas y los labios estn azules.   Observa sangre en la orina.  ASEGRESE DE QUE:  Comprende estas instrucciones.   Controlar su enfermedad.   Solicitar ayuda de inmediato si no mejora o empeora.  Document Released: 10/16/2010 Document Revised: 06/20/2011 [employer] Patient Information 2012 [employer]. 

## 2011-12-12 ENCOUNTER — Other Ambulatory Visit: Payer: Self-pay

## 2011-12-12 ENCOUNTER — Other Ambulatory Visit: Payer: Self-pay | Admitting: Family Medicine

## 2011-12-12 ENCOUNTER — Telehealth: Payer: Self-pay | Admitting: *Deleted

## 2011-12-12 DIAGNOSIS — O139 Gestational [pregnancy-induced] hypertension without significant proteinuria, unspecified trimester: Secondary | ICD-10-CM

## 2011-12-12 LAB — CBC
HCT: 35.7 % — ABNORMAL LOW (ref 36.0–46.0)
Hemoglobin: 12.1 g/dL (ref 12.0–15.0)
MCH: 29.2 pg (ref 26.0–34.0)
MCHC: 33.9 g/dL (ref 30.0–36.0)
MCV: 86.2 fL (ref 78.0–100.0)
Platelets: 194 10*3/uL (ref 150–400)
RBC: 4.14 MIL/uL (ref 3.87–5.11)
RDW: 13.2 % (ref 11.5–15.5)
WBC: 8.1 10*3/uL (ref 4.0–10.5)

## 2011-12-12 LAB — GLUCOSE TOLERANCE, 1 HOUR: Glucose, 1 Hour GTT: 181 mg/dL — ABNORMAL HIGH (ref 70–140)

## 2011-12-12 NOTE — Telephone Encounter (Signed)
Message copied by Mannie Stabile on Thu Dec 12, 2011  1:42 PM ------      Message from: Levie Heritage      Created: Thu Dec 12, 2011  9:18 AM       Needs 3hr GTT

## 2011-12-12 NOTE — Progress Notes (Signed)
Addended by: Doreen Salvage on: 12/12/2011 01:38 PM   Modules accepted: Orders

## 2011-12-12 NOTE — Progress Notes (Signed)
Addended by: Doreen Salvage on: 12/12/2011 01:28 PM   Modules accepted: Orders

## 2011-12-12 NOTE — Progress Notes (Signed)
Addended by: Doreen Salvage on: 12/12/2011 01:41 PM   Modules accepted: Orders

## 2011-12-12 NOTE — Progress Notes (Signed)
Addended by: Levie Heritage on: 12/12/2011 01:31 PM   Modules accepted: Orders

## 2011-12-13 LAB — CREATININE CLEARANCE, URINE, 24 HOUR
Creatinine Clearance: 196 mL/min — ABNORMAL HIGH (ref 75–115)
Creatinine, 24H Ur: 1694 mg/d (ref 700–1800)
Creatinine, Urine: 89.2 mg/dL
Creatinine: 0.6 mg/dL (ref 0.50–1.10)

## 2011-12-13 LAB — PROTEIN, URINE, 24 HOUR
Protein, 24H Urine: 95 mg/d (ref 50–100)
Protein, Urine: 5 mg/dL

## 2011-12-14 LAB — CULTURE, OB URINE: Colony Count: >=100000

## 2011-12-16 NOTE — Telephone Encounter (Signed)
Called patient and informed her abnormal gtt, she will come tomorrow morning for 3 hr gtt.

## 2011-12-17 ENCOUNTER — Other Ambulatory Visit: Payer: Self-pay

## 2011-12-17 DIAGNOSIS — O99810 Abnormal glucose complicating pregnancy: Secondary | ICD-10-CM

## 2011-12-18 LAB — GLUCOSE TOLERANCE, 3 HOURS
Glucose Tolerance, 1 hour: 183 mg/dL (ref 70–189)
Glucose Tolerance, 2 hour: 133 mg/dL (ref 70–164)
Glucose Tolerance, Fasting: 90 mg/dL (ref 70–104)
Glucose, GTT - 3 Hour: 118 mg/dL (ref 70–144)

## 2011-12-26 ENCOUNTER — Ambulatory Visit (INDEPENDENT_AMBULATORY_CARE_PROVIDER_SITE_OTHER): Payer: Self-pay | Admitting: Advanced Practice Midwife

## 2011-12-26 ENCOUNTER — Encounter: Payer: Self-pay | Admitting: Advanced Practice Midwife

## 2011-12-26 VITALS — BP 125/88 | Temp 97.4°F | Wt 246.8 lb

## 2011-12-26 DIAGNOSIS — E079 Disorder of thyroid, unspecified: Secondary | ICD-10-CM

## 2011-12-26 DIAGNOSIS — E039 Hypothyroidism, unspecified: Secondary | ICD-10-CM

## 2011-12-26 DIAGNOSIS — O0990 Supervision of high risk pregnancy, unspecified, unspecified trimester: Secondary | ICD-10-CM

## 2011-12-26 DIAGNOSIS — O99280 Endocrine, nutritional and metabolic diseases complicating pregnancy, unspecified trimester: Secondary | ICD-10-CM

## 2011-12-26 DIAGNOSIS — O139 Gestational [pregnancy-induced] hypertension without significant proteinuria, unspecified trimester: Secondary | ICD-10-CM

## 2011-12-26 DIAGNOSIS — O169 Unspecified maternal hypertension, unspecified trimester: Secondary | ICD-10-CM

## 2011-12-26 NOTE — Progress Notes (Signed)
Nutrition Note: (1st referral consult) Pt seen today for borderline GDM, 1 out of 3 elevated BS levels. Pt reports good intake of 4 meals daily, snacks on fruits. No food allergies and no nausea and vomiting.  Pt takes PNV daily and gets plenty of water. Disc benefits of CHO and protein combo, especially with high sugar snacks like fruits.  Overall wt gain is excellent at 8.8# @[redacted]w[redacted]d  gestation. Disc wt gain goals of 11-20# total and increased physical activity. Pt does receive WIC services.  Follow up if referred.  Cy Blamer, RD

## 2011-12-26 NOTE — Progress Notes (Signed)
U/S scheduled January 02, 2012 at 930 am.

## 2011-12-26 NOTE — Patient Instructions (Signed)
Embarazo - Tercer trimestre (Pregnancy - Third Trimester) El tercer trimestre del embarazo (los ltimos 3 meses) es el perodo de cambios ms rpidos que atraviesan usted y el beb. El aumento de peso es ms rpido. El beb alcanza un largo de aproximadamente 50 cm (20 pulgadas) y pesa entre 2,700 y 4,500 kg (6 a 10 libras). El beb gana ms tejido graso y ya est listo para la vida fuera del cuerpo de la madre. Mientras estn en el interior, los bebs tienen perodos de sueo y vigilia, succionan el pulgar y tienen hipo. Quizs sienta pequeas contracciones del tero. Este es el falso trabajo de parto. Tambin se las conoce como contracciones de Braxton-Hicks. Es como una prctica del parto. Los problemas ms habituales de esta etapa del embarazo incluyen mayor dificultad para respirar, hinchazn de las manos y los pies por retencin de lquidos y la necesidad de orinar con ms frecuencia debido a que el tero y el beb presionan sobre la vejiga.  EXAMENES PRENATALES  Durante los exmenes prenatales, deber seguir realizando pruebas de sangre, segn avance el embarazo. Estas pruebas se realizan para controlar su salud y la del beb. Tambin se realizan anlisis de sangre para conocer los niveles de hemoglobina. La anemia (bajo nivel de hemoglobina) es frecuente durante el embarazo. Para prevenirla, se administran hierro y vitaminas. Tambin le harn nuevas pruebas para descartar la diabetes. Podrn repetirle algunas de las pruebas que le hicieron previamente.   En cada visita le medirn el tamao del tero. Es para asegurarse de que el beb se desarrolla correctamente.   Tambin en cada visita la pesarn. Esto se realiza para asegurarse de que aumenta de peso al ritmo indicado y que usted y su beb evolucionan normalmente.   En algunas ocasiones se realiza una ecografa para confirmar el correcto desarrollo y evolucin del beb. Esta prueba se realiza con ondas sonoras inofensivas para el beb, de modo  que el profesional pueda calcular con ms precisin la fecha del parto.   Discuta las posibilidades de la anestesia si necesita cesrea.  Algunas veces se realizan pruebas especializadas del lquido amnitico que rodea al beb. Esta prueba se denomina amniocentesis. El lquido amnitico se obtiene introduciendo una aguja en el abdomen (vientre). En ocasiones se lleva a cabo cerca del final del embarazo, si es necesario adelantar el parto. En este caso se realiza para asegurarse de que los pulmones del beb estn lo suficientemente maduros como para que pueda vivir fuera del tero. CAMBIOS QUE OCURREN EN EL TERCER TRIMESTRE DEL EMBARAZO Su organismo atravesar diferentes cambios durante el embarazo que varan de una persona a otra. Converse con el profesional que la asiste acerca los cambios que usted note y que la preocupen.  Durante el ltimo trimestre probablemente sienta un aumento del apetito. Es normal tener "antojos" de ciertas comidas. Esto vara de una persona a otra y de un embarazo a otro.   Podrn aparecer las primeras estras en las caderas, abdomen y mamas. Estos son cambios normales del cuerpo durante el embarazo. No existen medicamentos ni ejercicios que puedan prevenir estos cambios.   El estreimiento puede tratarse con un laxante o agregando fibra a su dieta. Beber grandes cantidades de lquidos, tomar fibras en forma de verduras, frutas y granos integrales es de gran ayuda.   Tambin es beneficioso practicar actividad fsica. Si ha sido una persona activa hasta el embarazo, podr continuar con la mayora de las actividades durante el mismo. Si ha sido menos activa, puede ser beneficioso   que comience con un programa de ejercicios, como realizar caminatas. Consulte con el profesional que la asiste antes de comenzar un programa de ejercicios.   Evite el consumo de cigarrillos, el alcohol, los medicamentos no prescritos y las "drogas de la calle" durante el embarazo. Estas sustancias  qumicas afectan la formacin y el desarrollo del beb. Evite estas sustancias durante todo el embarazo para asegurar el nacimiento de un beb sano.   Dolor de espalda, venas varicosas y hemorroides podran aparecer o empeorar.   Los movimientos del beb pueden ser ms bruscos y aparecer ms a menudo.   Puede que note dificultades para respirar facilmente.   El ombligo podra salrsele hacia afuera.   Puede segregar un lquido amarillento (calostro) de las mamas.   Puede segregar mucus con sangre. Esto normalmente ocurre unos pocos das a una semana antes de que comience el trabajo de parto.  INSTRUCCIONES PARA EL CUIDADO DOMICILIARIO  La mayor parte de los cuidados que se aconsejan son los mismos que los indicados para las primeras etapas del embarazo. Es importante que concurra a todas las citas con el profesional y siga sus instrucciones con respecto a los medicamentos que deba utilizar, a la actividad fsica y a la dieta.   Durante el embarazo debe obtener nutrientes para usted y para su beb. Consuma alimentos balanceados a intervalos regulares. Elija alimentos como carne, pescado, leche y otros productos lcteos descremados, verduras, frutas, panes integrales y cereales. El profesional le informar cul es el aumento de peso ideal.   Las relaciones sexuales pueden continuarse hasta casi el final del embarazo, si no se presentan otros problemas como prdida prematura (antes de tiempo) de lquido amnitico, hemorragia vaginal o dolor abdominal (en el vientre).   Realice actividad fsica todos los das, si no tiene restricciones. Consulte con el profesional que la asiste si no sabe con certeza si determinados ejercicios son seguros. El mayor aumento de peso se produce en los dos ltimos trimestres del embarazo.   Haga reposo con frecuencia, con las piernas elevadas, o segn lo necesite para evitar los calambres y el dolor de cintura.   Use un buen sostn o como los que se usan para hacer  deportes para aliviar la sensibilidad de las mamas. Tambin puede serle til si lo usa mientras duerme. Si pierde calostro, podr utilizar apsitos en el sostn.   No utilice la baera con agua caliente, baos turcos y saunas.   Colquese el cinturn de seguridad cuando conduzca. Este la proteger a usted y al beb en caso de accidente.   Evite comer carne cruda y el contacto con los utensilios y desperdicios de los gatos. Estos elementos contienen grmenes que pueden causar defectos de nacimiento en el beb.   Es fcil perder algo de orina durante el embarazo. Apretar y fortalecer los msculos de la pelvis la ayudar con este problema. Practique detener la miccin cuando est en el bao. Estos son los mismos msculos que necesita fortalecer. Son tambin los mismos msculos que utiliza cuando trata de evitar los gases. Puede practicar apretando estos msculos diez veces, y repetir esto tres veces por da aproximadamente. Una vez que conozca qu msculos debe contraer, no realice estos ejercicios durante la miccin. Puede favorecerle una infeccin si la orina vuelve hacia atrs.   Pida ayuda si tiene necesidades econmicas, de asesoramiento o nutricionales durante el embarazo. El profesional podr ayudarla con respecto a estas necesidades, o derivarla a otros especialistas.   Practique la ida hasta el hospital a modo   de prueba.   Tome clases prenatales junto con su pareja para comprender, practicar y hacer preguntas acerca del trabajo de parto y el nacimiento.   Prepare la habitacin del beb.   No viaje fuera de la ciudad a menos que sea absolutamente necesario y con el consejo del mdico.   Use slo zapatos bajos sin taco para tener un mejor equilibrio y prevenir cadas.  EL CONSUMO DE MEDICAMENTOS Y DROGAS DURANTE EL EMBARAZO  Contine tomando las vitaminas apropiadas para esta etapa tal como se le indic. Las vitaminas deben contener un miligramo de cido flico y deben suplementarse con  hierro. Guarde todas las vitaminas fuera del alcance de los nios. La ingestin de slo un par de vitaminas o comprimidos que contengan hierro pueden ocasionar la muerte en un beb o en un nio pequeo.   Evite el uso de medicamentos, inclusive los de venta libre, que no hayan sido prescritos o indicados por el profesional que la asiste. Algunos medicamentos pueden causar problemas fsicos al beb. Utilice los medicamentos de venta libre o de prescripcin para el dolor, el malestar o la fiebre, segn se lo indique el profesional que lo asiste. No utilice aspirina, ibuprofeno (Motrin, Advil, Nuprin) o naproxeno (Aleve) a menos que el profesional la autorice.   El alcohol se asocia a cierto nmero de defectos del nacimiento, incluido el sndrome de alcoholismo fetal. Debe evitar el consumo de alcohol en cualquiera de sus formas. El cigarrillo causa nacimientos prematuros y bebs de bajo peso al nacer. Las drogas de la calle son muy nocivas para el beb y estn absolutamente prohibidas. Un beb que nace de una madre adicta, ser adicto al nacer. Ese beb tendr los mismos sntomas de abstinencia que un adulto.   Infrmele al profesional si consume alguna droga.  SOLICITE ATENCIN MDICA SI: Tiene alguna preocupacin durante el embarazo. Es mejor que llame para formular las preguntas si no puede esperar hasta la prxima visita, que sentirse preocupada por ellas.  DECISIONES ACERCA DE LA CIRCUNCISIN Usted puede saber o no cul es el sexo de su beb. Si es un varn, ste es el momento de pensar acerca de la circuncisin. La circuncisin es la extirpacin del prepucio. Esta es la piel que cubre el extremo sensible del pene. No hay un motivo mdico que lo justifique. Generalmente la decisin se toma segn lo que sea popular en ese momento, o se basa en creencias religiosas. Podr conversar estos temas con el profesional que la asiste. SOLICITE ATENCIN MDICA DE INMEDIATO SI:  La temperatura oral se eleva  sin motivo por encima de 102 F (38.9 C) o segn le indique el profesional que la asiste.   Tiene una prdida de lquido por la vagina (canal de parto). Si sospecha una ruptura de las membranas, tmese la temperatura y llame al profesional para informarlo sobre esto.   Observa unas pequeas manchas, una hemorragia vaginal o elimina cogulos. Avsele al profesional acerca de la cantidad y de cuntos apsitos est utilizando.   Presenta un olor desagradable en la secrecin vaginal y observa un cambio en el color, de transparente a blanco.   Ha vomitado durante ms de 24 horas.   Presenta escalofros o fiebre.   Comienza a sentir falta de aire.   Siente ardor al orinar.   Baja o sube ms de 900 g (ms de 2 libras), o segn lo indicado por el profesional que la asiste. Observa que sbitamente se le hinchan el rostro, las manos, los pies o las   piernas.   Presenta dolor abdominal. Las molestias en el ligamento redondo son una causa benigna (no cancerosa) frecuente de dolor abdominal durante el embarazo, pero el profesional que la asiste deber evaluarlo.   Presenta dolor de cabeza intenso que no se alivia.   Si no siente los movimientos del beb durante ms de tres horas. Si piensa que el beb no se mueve tanto como lo haca habitualmente, coma algo que contenga azcar y recustese sobre el lado izquierdo durante una hora. El beb debe moverse al menos 4  5 veces por hora. Comunquese inmediatamente si el beb se mueve menos que lo indicado.   Se cae, se ve involucrada en un accidente automovilstico o sufre algn tipo de traumatismo.   En su hogar hay violencia mental o fsica.  Document Released: 04/10/2005 Document Revised: 06/20/2011 [employer] Patient Information 2012 [employer]. 

## 2011-12-26 NOTE — Progress Notes (Signed)
Reviewed normal 3 hr GTT (except one hour result was high at 183).  Will have her meet with nutrition today. FH elevated, poss. Due to obesity but will get growth [place] due to that and hypothyroidism.

## 2011-12-26 NOTE — Progress Notes (Signed)
Pulse: 79

## 2012-01-02 ENCOUNTER — Ambulatory Visit (HOSPITAL_COMMUNITY)
Admission: RE | Admit: 2012-01-02 | Discharge: 2012-01-02 | Disposition: A | Discharge status: Home / Self Care | Payer: Self-pay | Source: Ambulatory Visit | Attending: Advanced Practice Midwife | Admitting: Advanced Practice Midwife

## 2012-01-02 DIAGNOSIS — O169 Unspecified maternal hypertension, unspecified trimester: Secondary | ICD-10-CM

## 2012-01-02 DIAGNOSIS — O99210 Obesity complicating pregnancy, unspecified trimester: Secondary | ICD-10-CM | POA: Insufficient documentation

## 2012-01-02 DIAGNOSIS — O10019 Pre-existing essential hypertension complicating pregnancy, unspecified trimester: Secondary | ICD-10-CM | POA: Insufficient documentation

## 2012-01-02 DIAGNOSIS — E669 Obesity, unspecified: Secondary | ICD-10-CM | POA: Insufficient documentation

## 2012-01-09 ENCOUNTER — Ambulatory Visit (INDEPENDENT_AMBULATORY_CARE_PROVIDER_SITE_OTHER): Payer: Self-pay | Admitting: Family

## 2012-01-09 VITALS — BP 121/85 | Temp 97.6°F | Wt 245.9 lb

## 2012-01-09 DIAGNOSIS — E079 Disorder of thyroid, unspecified: Secondary | ICD-10-CM

## 2012-01-09 DIAGNOSIS — O169 Unspecified maternal hypertension, unspecified trimester: Secondary | ICD-10-CM

## 2012-01-09 DIAGNOSIS — O139 Gestational [pregnancy-induced] hypertension without significant proteinuria, unspecified trimester: Secondary | ICD-10-CM

## 2012-01-09 DIAGNOSIS — O0990 Supervision of high risk pregnancy, unspecified, unspecified trimester: Secondary | ICD-10-CM

## 2012-01-09 DIAGNOSIS — O99280 Endocrine, nutritional and metabolic diseases complicating pregnancy, unspecified trimester: Secondary | ICD-10-CM

## 2012-01-09 LAB — POCT URINALYSIS DIP (DEVICE)
Bilirubin Urine: NEGATIVE
Glucose, UA: NEGATIVE mg/dL
Hgb urine dipstick: NEGATIVE
Ketones, ur: NEGATIVE mg/dL
Nitrite: NEGATIVE
Protein, ur: NEGATIVE mg/dL
Specific Gravity, Urine: 1.01 (ref 1.005–1.030)
Urobilinogen, UA: 0.2 mg/dL (ref 0.0–1.0)
pH: 7 (ref 5.0–8.0)

## 2012-01-09 NOTE — Progress Notes (Signed)
No questions or concerns; Reviewed ultrasound results - 33 wk 60%

## 2012-01-09 NOTE — Progress Notes (Signed)
Pulse: 69

## 2012-01-23 ENCOUNTER — Ambulatory Visit (INDEPENDENT_AMBULATORY_CARE_PROVIDER_SITE_OTHER): Payer: Self-pay | Admitting: Physician Assistant

## 2012-01-23 VITALS — BP 124/89 | Temp 97.5°F | Wt 246.3 lb

## 2012-01-23 DIAGNOSIS — O0990 Supervision of high risk pregnancy, unspecified, unspecified trimester: Secondary | ICD-10-CM

## 2012-01-23 DIAGNOSIS — O139 Gestational [pregnancy-induced] hypertension without significant proteinuria, unspecified trimester: Secondary | ICD-10-CM

## 2012-01-23 DIAGNOSIS — O169 Unspecified maternal hypertension, unspecified trimester: Secondary | ICD-10-CM

## 2012-01-23 LAB — POCT URINALYSIS DIP (DEVICE)
Bilirubin Urine: NEGATIVE
Glucose, UA: NEGATIVE mg/dL
Hgb urine dipstick: NEGATIVE
Ketones, ur: NEGATIVE mg/dL
Nitrite: NEGATIVE
Protein, ur: 30 mg/dL — AB
Specific Gravity, Urine: 1.015 (ref 1.005–1.030)
Urobilinogen, UA: 0.2 mg/dL (ref 0.0–1.0)
pH: 7 (ref 5.0–8.0)

## 2012-01-23 NOTE — Progress Notes (Signed)
No complaints. +FM daily. No s/s PTL. Reviewed with Dr. Shawnie Pons regarding dx of GHTN. Soft call. Will hold antenatal testing at this time and continue to follow BPs closely. Precautions reviewed with pt, verbalizes understanding.

## 2012-01-23 NOTE — Progress Notes (Signed)
P=81 Pressure in the lower pelvis

## 2012-01-30 ENCOUNTER — Ambulatory Visit (INDEPENDENT_AMBULATORY_CARE_PROVIDER_SITE_OTHER): Payer: Self-pay | Admitting: Family Medicine

## 2012-01-30 VITALS — BP 132/96 | Temp 97.6°F | Wt 247.6 lb

## 2012-01-30 DIAGNOSIS — O169 Unspecified maternal hypertension, unspecified trimester: Secondary | ICD-10-CM

## 2012-01-30 DIAGNOSIS — O0990 Supervision of high risk pregnancy, unspecified, unspecified trimester: Secondary | ICD-10-CM

## 2012-01-30 DIAGNOSIS — E079 Disorder of thyroid, unspecified: Secondary | ICD-10-CM

## 2012-01-30 DIAGNOSIS — E039 Hypothyroidism, unspecified: Secondary | ICD-10-CM

## 2012-01-30 DIAGNOSIS — O139 Gestational [pregnancy-induced] hypertension without significant proteinuria, unspecified trimester: Secondary | ICD-10-CM

## 2012-01-30 DIAGNOSIS — E669 Obesity, unspecified: Secondary | ICD-10-CM

## 2012-01-30 LAB — TSH: TSH: 2.128 u[IU]/mL (ref 0.350–4.500)

## 2012-01-30 LAB — OB RESULTS CONSOLE GBS: GBS: NEGATIVE

## 2012-01-30 NOTE — Progress Notes (Signed)
Patient doing well. GC/CHL and GBS obtained. Manual check, barely fingertip and thick. RTC in 1 week. Questions answered.

## 2012-01-30 NOTE — Patient Instructions (Signed)
Pregnancy - Third Trimester The third trimester of pregnancy (the last 3 months) is a period of the most rapid growth for you and your baby. The baby approaches a length of 20 inches and a weight of 6 to 10 pounds. The baby is adding on fat and getting ready for life outside your body. While inside, babies have periods of sleeping and waking, suck their thumbs, and hiccups. You can often feel small contractions of the uterus. This is false labor. It is also called Braxton-Hicks contractions. This is like a practice for labor. The usual problems in this stage of pregnancy include more difficulty breathing, swelling of the hands and feet from water retention, and having to urinate more often because of the uterus and baby pressing on your bladder.  PRENATAL EXAMS  Blood work may continue to be done during prenatal exams. These tests are done to check on your health and the probable health of your baby. Blood work is used to follow your blood levels (hemoglobin). Anemia (low hemoglobin) is common during pregnancy. Iron and vitamins are given to help prevent this. You may also continue to be checked for diabetes. Some of the past blood tests may be done again.   The size of the uterus is measured during each visit. This makes sure your baby is growing properly according to your pregnancy dates.   Your blood pressure is checked every prenatal visit. This is to make sure you are not getting toxemia.   Your urine is checked every prenatal visit for infection, diabetes and protein.   Your weight is checked at each visit. This is done to make sure gains are happening at the suggested rate and that you and your baby are growing normally.   Sometimes, an ultrasound is performed to confirm the position and the proper growth and development of the baby. This is a test done that bounces harmless sound waves off the baby so your caregiver can more accurately determine due dates.   Discuss the type of pain  medication and anesthesia you will have during your labor and delivery.   Discuss the possibility and anesthesia if a Cesarean Section might be necessary.   Inform your caregiver if there is any mental or physical violence at home.  Sometimes, a specialized non-stress test, contraction stress test and biophysical profile are done to make sure the baby is not having a problem. Checking the amniotic fluid surrounding the baby is called an amniocentesis. The amniotic fluid is removed by sticking a needle into the belly (abdomen). This is sometimes done near the end of pregnancy if an early delivery is required. In this case, it is done to help make sure the baby's lungs are mature enough for the baby to live outside of the womb. If the lungs are not mature and it is unsafe to deliver the baby, an injection of cortisone medication is given to the mother 1 to 2 days before the delivery. This helps the baby's lungs mature and makes it safer to deliver the baby. CHANGES OCCURING IN THE THIRD TRIMESTER OF PREGNANCY Your body goes through many changes during pregnancy. They vary from person to person. Talk to your caregiver about changes you notice and are concerned about.  During the last trimester, you have probably had an increase in your appetite. It is normal to have cravings for certain foods. This varies from person to person and pregnancy to pregnancy.   You may begin to get stretch marks on your hips,   abdomen, and breasts. These are normal changes in the body during pregnancy. There are no exercises or medications to take which prevent this change.   Constipation may be treated with a stool softener or adding bulk to your diet. Drinking lots of fluids, fiber in vegetables, fruits, and whole grains are helpful.   Exercising is also helpful. If you have been very active up until your pregnancy, most of these activities can be continued during your pregnancy. If you have been less active, it is helpful  to start an exercise program such as walking. Consult your caregiver before starting exercise programs.   Avoid all smoking, alcohol, un-prescribed drugs, herbs and "street drugs" during your pregnancy. These chemicals affect the formation and growth of the baby. Avoid chemicals throughout the pregnancy to ensure the delivery of a healthy infant.   Backache, varicose veins and hemorrhoids may develop or get worse.   You will tire more easily in the third trimester, which is normal.   The baby's movements may be stronger and more often.   You may become short of breath easily.   Your belly button may stick out.   A yellow discharge may leak from your breasts called colostrum.   You may have a bloody mucus discharge. This usually occurs a few days to a week before labor begins.  HOME CARE INSTRUCTIONS   Keep your caregiver's appointments. Follow your caregiver's instructions regarding medication use, exercise, and diet.   During pregnancy, you are providing food for you and your baby. Continue to eat regular, well-balanced meals. Choose foods such as meat, fish, milk and other low fat dairy products, vegetables, fruits, and whole-grain breads and cereals. Your caregiver will tell you of the ideal weight gain.   A physical sexual relationship may be continued throughout pregnancy if there are no other problems such as early (premature) leaking of amniotic fluid from the membranes, vaginal bleeding, or belly (abdominal) pain.   Exercise regularly if there are no restrictions. Check with your caregiver if you are unsure of the safety of your exercises. Greater weight gain will occur in the last 2 trimesters of pregnancy. Exercising helps:   Control your weight.   Get you in shape for labor and delivery.   You lose weight after you deliver.   Rest a lot with legs elevated, or as needed for leg cramps or low back pain.   Wear a good support or jogging bra for breast tenderness during  pregnancy. This may help if worn during sleep. Pads or tissues may be used in the bra if you are leaking colostrum.   Do not use hot tubs, steam rooms, or saunas.   Wear your seat belt when driving. This protects you and your baby if you are in an accident.   Avoid raw meat, cat litter boxes and soil used by cats. These carry germs that can cause birth defects in the baby.   It is easier to loose urine during pregnancy. Tightening up and strengthening the pelvic muscles will help with this problem. You can practice stopping your urination while you are going to the bathroom. These are the same muscles you need to strengthen. It is also the muscles you would use if you were trying to stop from passing gas. You can practice tightening these muscles up 10 times a set and repeating this about 3 times per day. Once you know what muscles to tighten up, do not perform these exercises during urination. It is more likely   to cause an infection by backing up the urine.   Ask for help if you have financial, counseling or nutritional needs during pregnancy. Your caregiver will be able to offer counseling for these needs as well as refer you for other special needs.   Make a list of emergency phone numbers and have them available.   Plan on getting help from family or friends when you go home from the hospital.   Make a trial run to the hospital.   Take prenatal classes with the father to understand, practice and ask questions about the labor and delivery.   Prepare the baby's room/nursery.   Do not travel out of the city unless it is absolutely necessary and with the advice of your caregiver.   Wear only low or no heal shoes to have better balance and prevent falling.  MEDICATIONS AND DRUG USE IN PREGNANCY  Take prenatal vitamins as directed. The vitamin should contain 1 milligram of folic acid. Keep all vitamins out of reach of children. Only a couple vitamins or tablets containing iron may be fatal  to a baby or young child when ingested.   Avoid use of all medications, including herbs, over-the-counter medications, not prescribed or suggested by your caregiver. Only take over-the-counter or prescription medicines for pain, discomfort, or fever as directed by your caregiver. Do not use aspirin, ibuprofen (Motrin, Advil, Nuprin) or naproxen (Aleve) unless OK'd by your caregiver.   Let your caregiver also know about herbs you may be using.   Alcohol is related to a number of birth defects. This includes fetal alcohol syndrome. All alcohol, in any form, should be avoided completely. Smoking will cause low birth rate and premature babies.   Street/illegal drugs are very harmful to the baby. They are absolutely forbidden. A baby born to an addicted mother will be addicted at birth. The baby will go through the same withdrawal an adult does.  SEEK MEDICAL CARE IF: You have any concerns or worries during your pregnancy. It is better to call with your questions if you feel they cannot wait, rather than worry about them. DECISIONS ABOUT CIRCUMCISION You may or may not know the sex of your baby. If you know your baby is a boy, it may be time to think about circumcision. Circumcision is the removal of the foreskin of the penis. This is the skin that covers the sensitive end of the penis. There is no proven medical need for this. Often this decision is made on what is popular at the time or based upon religious beliefs and social issues. You can discuss these issues with your caregiver or pediatrician. SEEK IMMEDIATE MEDICAL CARE IF:   An unexplained oral temperature above 102 F (38.9 C) develops, or as your caregiver suggests.   You have leaking of fluid from the vagina (birth canal). If leaking membranes are suspected, take your temperature and tell your caregiver of this when you call.   There is vaginal spotting, bleeding or passing clots. Tell your caregiver of the amount and how many pads are  used.   You develop a bad smelling vaginal discharge with a change in the color from clear to white.   You develop vomiting that lasts more than 24 hours.   You develop chills or fever.   You develop shortness of breath.   You develop burning on urination.   You loose more than 2 pounds of weight or gain more than 2 pounds of weight or as suggested by your   caregiver.   You notice sudden swelling of your face, hands, and feet or legs.   You develop belly (abdominal) pain. Round ligament discomfort is a common non-cancerous (benign) cause of abdominal pain in pregnancy. Your caregiver still must evaluate you.   You develop a severe headache that does not go away.   You develop visual problems, blurred or double vision.   If you have not felt your baby move for more than 1 hour. If you think the baby is not moving as much as usual, eat something with sugar in it and lie down on your left side for an hour. The baby should move at least 4 to 5 times per hour. Call right away if your baby moves less than that.   You fall, are in a car accident or any kind of trauma.   There is mental or physical violence at home.  Document Released: 06/25/2001 Document Revised: 06/20/2011 Document Reviewed: 12/28/2008 [employer] Patient Information 2012 [employer]. 

## 2012-01-30 NOTE — Progress Notes (Signed)
Pulse 73.  2ndBP: 116/86  C/o sudden, sharp pains on lower abdomen. No pressure.

## 2012-01-31 LAB — GC/CHLAMYDIA PROBE AMP, GENITAL
Chlamydia, DNA Probe: NEGATIVE
GC Probe Amp, Genital: NEGATIVE

## 2012-02-02 LAB — CULTURE, BETA STREP (GROUP B ONLY)

## 2012-02-06 ENCOUNTER — Ambulatory Visit (INDEPENDENT_AMBULATORY_CARE_PROVIDER_SITE_OTHER): Payer: Self-pay | Admitting: Obstetrics & Gynecology

## 2012-02-06 VITALS — BP 121/83 | Temp 97.8°F | Wt 248.0 lb

## 2012-02-06 DIAGNOSIS — O139 Gestational [pregnancy-induced] hypertension without significant proteinuria, unspecified trimester: Secondary | ICD-10-CM

## 2012-02-06 DIAGNOSIS — E079 Disorder of thyroid, unspecified: Secondary | ICD-10-CM

## 2012-02-06 DIAGNOSIS — O169 Unspecified maternal hypertension, unspecified trimester: Secondary | ICD-10-CM

## 2012-02-06 DIAGNOSIS — O99280 Endocrine, nutritional and metabolic diseases complicating pregnancy, unspecified trimester: Secondary | ICD-10-CM

## 2012-02-06 DIAGNOSIS — E039 Hypothyroidism, unspecified: Secondary | ICD-10-CM

## 2012-02-06 DIAGNOSIS — O0990 Supervision of high risk pregnancy, unspecified, unspecified trimester: Secondary | ICD-10-CM

## 2012-02-06 LAB — POCT URINALYSIS DIP (DEVICE)
Bilirubin Urine: NEGATIVE
Glucose, UA: NEGATIVE mg/dL
Hgb urine dipstick: NEGATIVE
Ketones, ur: NEGATIVE mg/dL
Nitrite: NEGATIVE
Protein, ur: 30 mg/dL — AB
Specific Gravity, Urine: 1.015 (ref 1.005–1.030)
Urobilinogen, UA: 0.2 mg/dL (ref 0.0–1.0)
pH: 7 (ref 5.0–8.0)

## 2012-02-06 NOTE — Patient Instructions (Signed)
Trabajo de parto y parto normal (Normal Labor and Delivery) En primer lugar, su mdico debe estar seguro de que usted est en trabajo de parto. Algunos signos son:  Puede haber eliminado el "tapn mucoso" antes que comience el trabajo de parto. Se trata de una pequea cantidad de mucus con sangre.   Tiene contracciones uterinas regulares.   El tiempo entre las contracciones se acorta.   Las molestias y el dolor se hacen gradualmente ms intensos.   El dolor se ubica principalmente en la espalda.   Los dolores empeoran al caminar.   El cuello del tero (la apertura del tero se hace ms delgada, comienza a borrarse, y se abre (se dilata).  Una vez que se encuentre en trabajo de parto y sea admitida en el hospital, el mdico har lo siguiente:  Un examen fsico completo.   Controlar sus signos vitales (presin arterial, pulso, temperatura y la frecuencia cardaca fetal).   Realizar un examen vaginal (usando un guante estril y lubricante para determinar:   La posicin (presentacin) del beb (ceflica [vertex] o nalgas primero).   El nivel (plano) de la cabeza del beb en el canal de parto.   El borramiento y dilatacin del cuello del tero.   Le rasurarn el vello pbico y le aplicarn una enema segn lo considere el mdico y las circunstancias.   Generalmente se coloca un monitor electrnico sobre el abdomen. El monitor sigue la duracin e intensidad de las contracciones, as como la frecuencia cardaca del beb.   Generalmente, el profesional inserta una va intravenosa en el brazo para administrarle agua azucarada. Esta es una medida de precaucin, de modo que puedan administrarle rpidamente medicamentos durante el trabajo de parto.  EL TRABAJO DE PARTO Y PARTO NORMALES SE DIVIDEN EN 3 ETAPAS: Primera etapa Comienzan las contracciones regulares y el cuello comienza a borrarse y dilatarse. Esta etapa puede durar entre 3 y 15 horas. El final de la primera etapa se considera  cuando el cuello est borrado en un 100% y se ha dilatado 10 cm. Le administrarn analgsicos por:  Inyeccin (morfina, demerol, etc.).   Anestesia regional (espinal, caudal o epidural, anestsicos colocados en diferentes regiones de la columna vertebral). Podrn administrarle medicamentos para el dolor en la regin paracervical, que consiste en la aplicacin de un anestsico inyectable en cada uno de los lados del cuello del tero.  La embarazada puede requerir un "parto natural" , es decir no recibir medicamentos o anestesia durante el trabajo de parto y el parto. Segunda etapa En este momento el beb baja a travs del canal de parto (vagina) y nace. Esto puede durar entre 1 y 4 horas. A medida que el beb asoma la cabeza por el canal de parto, podr sentir una sensacin similar a cuando mueve el intestino. Sentir el impulse de empujar con fuerza hasta que el nio salga. A medida que la cabecita baja, el mdico decidir si realiza una episiotoma (corte en el perineo y rea de la vagina) para evitar la ruptura de los tejidos). Luego del nacimiento del beb y la expulsin de la placenta, la episiotoma se sutura. En algunos casos se coloca a la madre una mscara con xido nitroso para facilitar la respiracin y aliviar el dolor. El final de la etapa 2 se produce cuando el beb ha salido completamente. Luego, cuando el cordn umbilical deja de pulsar, se pinza y se corta. Tercera etapa La tercera etapa comienza luego que el beb ha nacido y finaliza luego de la   expulsin de la placenta. Generalmente esto lleva entre 5 y 30 minutos. Luego de la expulsin de la placenta, le aplicarn un medicamento por va intravenosa para ayudar a contraer el tero y prevenir hemorragias. En la tercera etapa no hay dolor y generalmente no son necesarios los analgsicos. Si le han realizado una episiotoma, es el momento de repararla. Luego del parto, la mam es observada y controlada exhaustivamente durante 1  2 horas  para verificar que no hay sangrado en el post parto (hemorragias). Si pierde mucha sangre, le administrarn un medicamento para contraer el tero y detener la hemorragia. Document Released: 06/13/2008 Document Revised: 06/20/2011 [employer] Patient Information 2012 [employer]. 

## 2012-02-06 NOTE — Progress Notes (Signed)
Pulse:  86 Has pelvic pressure and pain

## 2012-02-06 NOTE — Progress Notes (Signed)
No complaints. Doing well. Continue weekly visits

## 2012-02-13 ENCOUNTER — Ambulatory Visit (INDEPENDENT_AMBULATORY_CARE_PROVIDER_SITE_OTHER): Payer: Self-pay | Admitting: Physician Assistant

## 2012-02-13 VITALS — BP 132/84 | Temp 97.0°F | Wt 248.9 lb

## 2012-02-13 DIAGNOSIS — E079 Disorder of thyroid, unspecified: Secondary | ICD-10-CM

## 2012-02-13 DIAGNOSIS — O99280 Endocrine, nutritional and metabolic diseases complicating pregnancy, unspecified trimester: Secondary | ICD-10-CM

## 2012-02-13 DIAGNOSIS — E039 Hypothyroidism, unspecified: Secondary | ICD-10-CM

## 2012-02-13 LAB — POCT URINALYSIS DIP (DEVICE)
Bilirubin Urine: NEGATIVE
Glucose, UA: NEGATIVE mg/dL
Hgb urine dipstick: NEGATIVE
Ketones, ur: NEGATIVE mg/dL
Nitrite: NEGATIVE
Protein, ur: NEGATIVE mg/dL
Specific Gravity, Urine: 1.015 (ref 1.005–1.030)
Urobilinogen, UA: 0.2 mg/dL (ref 0.0–1.0)
pH: 7 (ref 5.0–8.0)

## 2012-02-13 NOTE — Patient Instructions (Signed)
Hipertensin durante el embarazo  (Hypertension During Pregnancy) La hipertensin tambin se denomina presin arterial alta. Puede ocurrir en cualquier momento de la vida y tambin durante el embarazo. Cuando se sufre hipertensin, existe una presin extra en el interior de los vasos sanguneos que llevan la sangre desde el corazn al resto del cuerpo (arterias). La hipertensin durante el embarazo puede causar problemas para usted y el beb. Puede ser que el beb no tenga el peso adecuado al nacer o puede nacer antes de tiempo (prematuro). En los casos muy graves de hipertensin durante el embarazo puede estar en peligro la vida.  Hay diferentes tipos de hipertensin durante el embarazo.   Hipertensin crnica. Esto sucede cuando una mujer sufre de hipertensin antes del embarazo y contina durante el mismo.   Hipertensin gestacional. Es cuando se desarrolla la hipertensin durante el embarazo.   Preeclampsia o toxemia del embarazo. Es un tipo muy grave de hipertensin que se desarrolla slo durante el embarazo. Es una enfermedad que afecta a todo el cuerpo (sistmica) y puede ser muy peligrosa tanto para la madre como para el beb.   La hipertensin gestacional y preeclampsia por lo general desaparecen despus de nacer el beb. La presin arterial generalmente se estabiliza dentro de las 6 semanas. Las mujeres que sufren de hipertensin durante el embarazo tienen una mayor probabilidad de desarrollar hipertensin en etapas posteriores de la vida o en embarazos futuros.  ENTENDER LA PRESIN ARTERIAL  La presin arterial hace que se mueva la sangre en el cuerpo. A veces, la fuerza que mueve la sangre es demasiado intensa.   La lectura de la presin arterial se expresa en 2 nmeros y se ve como una fraccin.   El primer nmero es la presin sistlica. Cuando el corazn late, fuerza a que fluya ms sangre por las arterias. La presin dentro de las arterias aumenta.   El nmero inferior es la  presin diastlica. La presin baja entre los latidos. Eso ocurre cuando el corazn est en reposo.   Usted puede tener hipertensin si:   La presin arterial sistlica es superior a 140.   La presin diastlica es superior a 90.  FACTORES DE RIESGO  Algunos factores favorecen el desarrollo de la hipertensin durante el embarazo. Los factores de riesgo son:   Sufrir hipertensin antes del embarazo.   Haber sufrido hipertensin durante un embarazo anterior.   Tener sobrepeso.   Ser mayor de 40 aos.   Estar embarazada de ms de un beb (mltiples).   Tener diabetes o problemas renales.  SNTOMAS  La hipertensin gestacional y crnica pueden no causar sntomas. La preeclampsia causa sntomas, que pueden ser:   Aumento de las protenas en la orina. El mdico va a controlar esto en cada control prenatal.   Hinchazn de las manos y la cara.   Aumento rpido de peso.   Dolores de cabeza.   Cambios visuales.   Molestias al ver la luz.   Dolor abdominal, especialmente en el rea superior derecha.   Dolor en el pecho.   Falta de aire.   Aumento de los reflejos.   Convulsiones. Las convulsiones ocurren en una forma ms grave de preeclampsia, llamada eclampsia.  DIAGNSTICO   Puede ser diagnosticada con hipertensin en el embarazo durante un control prenatal regular. En cada visita, las pruebas pueden ser:   Control de la presin arterial.   Anlisis de orina para detectar protenas en la orina.   El tipo de hipertensin que se diagnostica depende del momento   en que se desarroll. Tambin depende de la lectura de su presin arterial especfica.   El desarrollo de hipertensin antes de las 20 semanas de embarazo es consistente con hipertensin crnica.   El desarrollo de la hipertensin despus de las 20 semanas de embarazo es consistente con hipertensin gestacional.   Hipertensin con aumento de la protena urinaria se diagnostica como preeclampsia.   Las mediciones  de la presin arterial de ms de 160 sistlica o 110 diastlica son un signo de preeclampsia grave.  TRATAMIENTO  El tratamiento para la hipertensin durante el embarazo vara. Depende del tipo de hipertensin y de su gravedad.   Si toma medicamentos para la hipertensin crnica, puede que tenga que cambiarlos.   Los medicamentos llamados inhibidores de la ECA no deben tomarse Academic librarian.   Para las mujeres que tienen factores de riesgo de preeclampsia pueden recomendarse bajas dosis de aspirina.   Si usted tiene Occupational hygienist, tendr que tomar un medicamento para la presin arterial que sea seguro durante el [place]. Su mdico le Paediatric nurse apropiado.   Si tiene preeclampsia grave, es posible que tenga que Engineer, maintenance hospital. Los mdicos la controlarn a usted y al beb muy de cerca. Puede ser que necesite tomar medicamentos (sulfato de magnesio) para prevenir las convulsiones y reducir la presin arterial.   A veces es necesario un parto prematuro. Este puede ser el caso si el problema [place]. Se hace para protegerlos a usted y a su beb. La nica cura para la preeclampsia es el parto.  INSTRUCCIONES PARA EL CUIDADO EN EL HOGAR   Cumpla con todos los controles prenatales regulares.   Siga las indicaciones del profesional con respecto a Adult nurse. Dgale a su mdico sobre todos los [employer] toma. Incluya los medicamentos de [place].   Consuma la menor cantidad posible de sal.   Realice actividad fsica con regularidad.   No beba alcohol.   No use productos que contengan tabaco.   No tome bebidas con cafena.   Acustese sobre el lado izquierdo cuando haga reposo.   Informe a su mdico si tiene sntomas de preeclampsia.  SOLICITE ATENCIN MDICA DE INMEDIATO SI:   Siente un dolor abdominal intenso.   Observa hinchazn repentina y [employer], tobillos o el rostro.   Aumento de peso de ms de 4  libras (1,8 kg) o ms en una semana.   Vomita repetidas veces.   Presenta una hemorragia vaginal abundante.   No siente los movimientos del beb.   Sufre una cefalea grave.   Tiene visin doble o borrosa.   Tiene calambres o espasmos musculares.   Le falta el aire.   Tiene las yemas de los dedos y los labios [place].   Observa sangre en la orina.  ASEGRESE DE QUE:   Comprende estas instrucciones.   Controlar su enfermedad.   Solicitar ayuda de inmediato si no mejora o si empeora.  Document Released: 06/20/2011 [facility] Patient Information 2012 [place], [place].Natural Childbirth Natural childbirth is going through labor and delivery without any drugs to relieve pain. You also do not use fetal monitors, have a cesarean delivery, or get a sugical cut to enlarge the vaginal opening (episiotomy). With the help of a birthing professional (midwife), you will direct your own labor and delivery as you choose. Many women chose natural childbirth because they feel more in control and in touch with their labor and delivery. They are also concerned about the medications affecting  themselves and the baby. Pregnant women with a high risk pregnancy should not attempt natural childbirth. It is better to deliver the infant in a hospital if an emergency situation arises. Sometimes, the caregiver has to intervene for the health and safety of the mother and infant. TWO TECHNIQUES FOR NATURAL CHILDBIRTH:   The Lamaze method. This method teaches women that having a baby is normal, healthy, and natural. It also teaches the mother to take a neutral position regarding pain medication and anesthesia and to make an informed decision if and when it is right for them.   The Erven Colla (also called husband coached birth). This method teaches the father to be the birth coach and stresses a natural approach. It also encourages exercise and a balanced diet with good nutrition. The exercises teach relaxation  and deep breathing techniques. However, there are also classes to prepare the parents for an emergency situation that may occur.  METHODS OF DEALING WITH LABOR PAIN AND DELIVERY:  Meditation.   Yoga.   Hypnosis.   Acupuncture.   Massage.   Changing positions (walking, rocking, showering, leaning on birth balls).   Lying in warm water or a jacuzzi.   Find an activity that keeps your mind off of the labor pain.   Listen to soft music.   Visual imagery (focus on a particular object).  BEFORE GOING INTO LABOR  Be sure you and your spouse/partner are in agreement to have natural childbirth.   Decide if your caregiver or a midwife will deliver your baby.   Decide if you will have your baby in the hospital, birthing center, or at home.   If you have children, make plans to have someone to take care of them when you go to the hospital.   Know the distance and the time it takes to go to the delivery center. Make a dry run to be sure.   Have a bag packed with a night gown, bathrobe, and toiletries ready to take when you go into labor.   Keep phone numbers of your family and friends handy if you need to call someone when you go into labor.   Your spouse or partner should go to all the teaching classes.   Talk with your caregiver about the possibility of a medical emergency and what will happen if that occurs.  ADVANTAGES OF NATURAL CHILDBIRTH  You are in control of your labor and delivery.   It is safe.   There are no medications or anesthetics that may affect you and the fetus.   There are no invasive procedures such as an episiotomy.   You and your partner will work together, which can increase your bond.   Meditation, yoga, massage, and breathing exercises can be learned while pregnant and help you when you are in labor and at delivery.   In most delivery centers, the family and friends can be involved in the labor and delivery process.  DISADVANTAGES OF NATURAL  CHILDBIRTH  You will experience pain during your labor and delivery.   The methods of helping relieve your labor pains may not work for you.   You may feel embarrassed, disappointed, and like a failure if you decide to change your mind during labor and not have natural childbirth.  AFTER THE DELIVERY  You will be very tired.   You will be uncomfortable because of your uterus contracting. You will feel soreness around the vagina.   You may feel cold and shaky.This is a natural  reaction.   You will be excited, overwhelmed, accomplished, and proud to be a mother.  HOME CARE INSTRUCTIONS   Follow the advice and instructions of your caregiver.   Follow the instructions of your natural childbirth instructor (Lamaze or Bradley Method).  Document Released: 06/13/2008 Document Revised: 06/20/2011 Document Reviewed: 06/13/2008 [facility] Patient Information 2012 [place], [place].

## 2012-02-13 NOTE — Progress Notes (Signed)
No complaints. +FM. No s/s labor or pre-x. NL reflexes. precautions reviewed.

## 2012-02-13 NOTE — Progress Notes (Signed)
Pulse- 88  Pain-lower abd

## 2012-02-15 ENCOUNTER — Inpatient Hospital Stay (HOSPITAL_COMMUNITY)
Admission: AD | Admit: 2012-02-15 | Discharge: 2012-02-15 | Disposition: A | Discharge status: Home / Self Care | Payer: Self-pay | Source: Ambulatory Visit | Attending: Obstetrics & Gynecology | Admitting: Obstetrics & Gynecology

## 2012-02-15 ENCOUNTER — Encounter (HOSPITAL_COMMUNITY): Payer: Self-pay | Admitting: *Deleted

## 2012-02-15 DIAGNOSIS — O479 False labor, unspecified: Secondary | ICD-10-CM | POA: Insufficient documentation

## 2012-02-15 NOTE — MAU Provider Note (Signed)
Agree with note. 

## 2012-02-15 NOTE — MAU Note (Signed)
Pt reports having ctx since 0630 this morning. Denies leaking or bleeding. reports ct are not that close together

## 2012-02-15 NOTE — MAU Provider Note (Signed)
S:  31 y.o. G2P0010 presents to MAU for labor evaluation.  O: BP 125/88  Pulse 75  Temp 97.8 F (36.6 C) (Oral)  Resp 16  LMP 05/12/2011 Patient Vitals for the past 24 hrs:  BP Temp Temp src Pulse Resp  02/15/12 1102 125/88 mmHg - - 75  -  02/15/12 1047 138/91 mmHg - - 77  -  02/15/12 1030 137/87 mmHg - - 78  -  02/15/12 1028 137/87 mmHg 97.8 F (36.6 C) Oral 94  16    Dilation: 1 Effacement (%): 90 Cervical Position: Middle Station: -2 Presentation: Vertex Exam by:: K.WIlson,RN  I have reviewed the FHR tracing:  FHR baseline initiallly 150 upon arrival, and FHR down to 140 lasting 40-70 seconds x3-4 episodes, which did not correlate with contractions so were not early or late decels Pt remained on monitor for 2 hours and no decelerations or abnormalities noted in 1.5 hours of monitoring prior to discharge. Multiple accelerations noted. Pt position changed and PO fluids given while in MAU.  Last 1.5 hours of monitoring:  FHR baseline 145 with moderate variability, positive accels, negative decels--Category I tracing Contractions 4-8 minutes, irregular  A:   Threatened labor at term  P: D/C home with labor precautions RN to review preeclampsia precautions and reasons to return to hospital  Sharen Counter Certified Nurse-Midwife

## 2012-02-18 ENCOUNTER — Inpatient Hospital Stay (HOSPITAL_COMMUNITY)
Admission: AD | Admit: 2012-02-18 | Discharge: 2012-02-21 | DRG: 775 | Disposition: A | Discharge status: Home / Self Care | Payer: Medicaid Other | Source: Ambulatory Visit | Attending: Family Medicine | Admitting: Family Medicine

## 2012-02-18 ENCOUNTER — Encounter (HOSPITAL_COMMUNITY): Payer: Self-pay | Admitting: *Deleted

## 2012-02-18 DIAGNOSIS — IMO0001 Reserved for inherently not codable concepts without codable children: Secondary | ICD-10-CM

## 2012-02-18 LAB — ABO/RH: ABO/RH(D): O POS

## 2012-02-18 LAB — CBC
HCT: 38.5 % (ref 36.0–46.0)
Hemoglobin: 13 g/dL (ref 12.0–15.0)
MCH: 29.7 pg (ref 26.0–34.0)
MCHC: 33.8 g/dL (ref 30.0–36.0)
MCV: 87.9 fL (ref 78.0–100.0)
Platelets: 187 10*3/uL (ref 150–400)
RBC: 4.38 MIL/uL (ref 3.87–5.11)
RDW: 13.2 % (ref 11.5–15.5)
WBC: 10.9 10*3/uL — ABNORMAL HIGH (ref 4.0–10.5)

## 2012-02-18 LAB — TYPE AND SCREEN
ABO/RH(D): O POS
Antibody Screen: NEGATIVE

## 2012-02-18 MED ORDER — OXYTOCIN 40 UNITS IN LACTATED RINGERS INFUSION - SIMPLE MED: 62.5000 mL/h | Freq: Once | INTRAVENOUS | Status: DC

## 2012-02-18 MED ORDER — LACTATED RINGERS IV SOLN
500.0000 mL | INTRAVENOUS | Status: DC | PRN
  Administered 2012-02-18: 500 mL via INTRAVENOUS

## 2012-02-18 MED ORDER — LIDOCAINE HCL (PF) 1 % IJ SOLN
30.0000 mL | INTRAMUSCULAR | Status: DC | PRN
  Filled 2012-02-18: qty 30

## 2012-02-18 MED ORDER — IBUPROFEN 600 MG PO TABS: 600.0000 mg | ORAL_TABLET | Freq: Four times a day (QID) | ORAL | Status: DC | PRN

## 2012-02-18 MED ORDER — LACTATED RINGERS IV SOLN
INTRAVENOUS | Status: DC
  Administered 2012-02-18 – 2012-02-19 (×3): via INTRAVENOUS

## 2012-02-18 MED ORDER — OXYTOCIN BOLUS FROM INFUSION
250.0000 mL | Freq: Once | INTRAVENOUS | Status: AC
  Administered 2012-02-19: 250 mL via INTRAVENOUS
  Filled 2012-02-18: qty 500

## 2012-02-18 MED ORDER — CITRIC ACID-SODIUM CITRATE 334-500 MG/5ML PO SOLN: 30.0000 mL | ORAL | Status: DC | PRN

## 2012-02-18 MED ORDER — FLEET ENEMA 7-19 GM/118ML RE ENEM: 1.0000 | ENEMA | RECTAL | Status: DC | PRN

## 2012-02-18 MED ORDER — ACETAMINOPHEN 325 MG PO TABS: 650.0000 mg | ORAL_TABLET | ORAL | Status: DC | PRN

## 2012-02-18 MED ORDER — OXYCODONE-ACETAMINOPHEN 5-325 MG PO TABS: 1.0000 | ORAL_TABLET | ORAL | Status: DC | PRN

## 2012-02-18 MED ORDER — ONDANSETRON HCL 4 MG/2ML IJ SOLN: 4.0000 mg | Freq: Four times a day (QID) | INTRAMUSCULAR | Status: DC | PRN

## 2012-02-18 MED ORDER — FENTANYL CITRATE 0.05 MG/ML IJ SOLN
100.0000 ug | INTRAMUSCULAR | Status: DC | PRN
  Administered 2012-02-19 (×3): 100 ug via INTRAVENOUS
  Filled 2012-02-18 (×3): qty 2

## 2012-02-18 MED ORDER — LEVOTHYROXINE SODIUM 25 MCG PO TABS
25.0000 ug | ORAL_TABLET | Freq: Every day | ORAL | Status: DC
  Administered 2012-02-19 – 2012-02-21 (×3): 25 ug via ORAL
  Filled 2012-02-18 (×3): qty 1

## 2012-02-18 NOTE — Progress Notes (Signed)
Tonya Simmons is a 31 y.o. G2P0010 at [redacted]w[redacted]d   Subjective: Coping well with contractions  Objective: BP 129/81  Pulse 79  Temp 98.1 F (36.7 C) (Oral)  Resp 20  Ht 5\' 2"  (1.575 m)  Wt 248 lb 8 oz (112.719 kg)  BMI 45.45 kg/m2  LMP 05/12/2011      FHT:  FHR: 160 bpm, variability: moderate,  accelerations:  Present,  decelerations:  Absent UC:   irregular, every 2-7 minutes SVE:   Dilation: 4.5 Effacement (%): 90 Station: -2 Exam by:: Tonya Simmons,cnm  Labs: Lab Results  Component Value Date   WBC 10.9* 02/18/2012   HGB 13.0 02/18/2012   HCT 38.5 02/18/2012   MCV 87.9 02/18/2012   PLT 187 02/18/2012    Assessment / Plan: Spontaneous labor, progressing normally  Labor: Progressing normally Preeclampsia:  n/a Fetal Wellbeing:  Category I Pain Control:  Labor support without medications I/D:  n/a Anticipated MOD:  NSVD  Will re-eval in 2-3 hours or prn.  Tonya Simmons. 02/18/2012, 11:54 PM

## 2012-02-18 NOTE — MAU Note (Signed)
Pt states ctx's q54minutes apart, has noted bloody show and small amt of vaginal fluid.

## 2012-02-18 NOTE — H&P (Signed)
Tonya Simmons is a 31 y.o. G2P0010 at [redacted]w[redacted]d presenting with contractions.  They started this morning and have been about 5 minutes apart.  Has has some mild leaking, but no gush of fluid.  Did report of blood tinged mucous discharge today.  Good fetal movement.  No headache, edema.  Mild nausea.  History obtained with aid of phone interpreter.   Maternal Medical History:  Reason for admission: Reason for admission: contractions and nausea (mild).  Contractions: Onset was 6-12 hours ago.   Frequency: regular.   Perceived severity is strong.    Fetal activity: Perceived fetal activity is normal.   Last perceived fetal movement was within the past hour.    Prenatal complications: Hypertension.   No bleeding.   Prenatal Complications - Diabetes: none.    OB History    Grav Para Term Preterm Abortions TAB SAB Ect Mult Living   2    1  1    0     Past Medical History  Diagnosis Date  . Hypothyroidism    Past Surgical History  Procedure Date  . No past surgeries    Family History: family history includes Hypertension in her father; Osteoporosis in her mother; and Stroke in her father. Social History:  reports that she has never smoked. She has never used smokeless tobacco. She reports that she does not drink alcohol or use illicit drugs.   Prenatal Transfer Tool  Maternal Diabetes: No Genetic Screening: Declined Maternal Ultrasounds/Referrals: Normal Fetal Ultrasounds or other Referrals:  None Maternal Substance Abuse:  No Significant Maternal Medications:  Meds include: Syntroid Significant Maternal Lab Results:  Lab values include: Group B Strep negative Other Comments:  None  Review of Systems  Constitutional: Negative for fever and chills.  Respiratory: Negative for cough and shortness of breath.   Cardiovascular: Negative for chest pain.  Gastrointestinal: Positive for nausea (mild). Negative for vomiting.  Neurological: Negative for dizziness and headaches.     Dilation: 3 Effacement (%): 90 Station: -2 Exam by:: Dr. Elwyn Reach Blood pressure 120/87, pulse 86, temperature 98.4 F (36.9 C), temperature source Oral, resp. rate 18, height 5\' 2"  (1.575 m), weight 248 lb 8 oz (112.719 kg), last menstrual period 05/12/2011. Maternal Exam:  Uterine Assessment: Contraction strength is moderate.  Contraction frequency is regular.   Abdomen: Patient reports no abdominal tenderness. Fundal height is appropriate for dates.   Fetal presentation: vertex  Introitus: Normal vulva. Normal vagina.  Pelvis: adequate for delivery.   Cervix: Cervix evaluated by digital exam.     Fetal Exam Fetal Monitor Review: Mode: ultrasound.   Baseline rate: 150.  Variability: moderate (6-25 bpm).   Pattern: accelerations present and no decelerations.    Fetal State Assessment: Category I - tracings are normal.     Physical Exam  Constitutional: She is oriented to person, place, and time. She appears well-developed and well-nourished. She appears distressed (with contractions).  HENT:  Head: Normocephalic and atraumatic.  Eyes: No scleral icterus.  Neck: Normal range of motion. Neck supple. No thyromegaly present.  Cardiovascular: Normal rate, regular rhythm and intact distal pulses.   No murmur heard. Respiratory: Effort normal and breath sounds normal. She has no wheezes. She has no rales.  GI:       Gravid   Genitourinary: Vagina normal.  Musculoskeletal: She exhibits edema (1+ to knees bilaterally). She exhibits no tenderness.  Lymphadenopathy:    She has no cervical adenopathy.  Neurological: She is alert and oriented to person, place, and time.  She exhibits normal muscle tone.  Skin: Skin is warm and dry. No rash noted. No erythema.  Psychiatric: She has a normal mood and affect. Her behavior is normal.    Prenatal labs: ABO, Rh: O/POS/-- (01/03 1008) Antibody: NEG (01/03 1008) Rubella: 42.8 (01/03 1008) RPR: NON REAC (05/16 1138)  HBsAg: NEGATIVE  (01/03 1008)  HIV: NON REACTIVE (05/16 1138)  GBS:   negative  Assessment/Plan: 30 yo G2P0010 at [redacted]w[redacted]d in early labor -admit to L&D  -labor support -GBS negative  BOOTH, ERIN 02/18/2012, 8:22 PM  Saw pt and agree [facility] 02/21/2012 10:55 AM

## 2012-02-19 ENCOUNTER — Encounter (HOSPITAL_COMMUNITY): Payer: Self-pay | Admitting: Anesthesiology

## 2012-02-19 ENCOUNTER — Encounter (HOSPITAL_COMMUNITY): Payer: Self-pay | Admitting: *Deleted

## 2012-02-19 ENCOUNTER — Inpatient Hospital Stay (HOSPITAL_COMMUNITY): Payer: Medicaid Other | Admitting: Anesthesiology

## 2012-02-19 LAB — RPR: RPR Ser Ql: NONREACTIVE

## 2012-02-19 LAB — CBC
HCT: 32.2 % — ABNORMAL LOW (ref 36.0–46.0)
Hemoglobin: 11 g/dL — ABNORMAL LOW (ref 12.0–15.0)
MCH: 30 pg (ref 26.0–34.0)
MCHC: 34.2 g/dL (ref 30.0–36.0)
MCV: 87.7 fL (ref 78.0–100.0)
Platelets: 167 10*3/uL (ref 150–400)
RBC: 3.67 MIL/uL — ABNORMAL LOW (ref 3.87–5.11)
RDW: 13.3 % (ref 11.5–15.5)
WBC: 16.4 10*3/uL — ABNORMAL HIGH (ref 4.0–10.5)

## 2012-02-19 MED ORDER — TERBUTALINE SULFATE 1 MG/ML IJ SOLN: 0.2500 mg | Freq: Once | INTRAMUSCULAR | Status: DC | PRN

## 2012-02-19 MED ORDER — PRENATAL MULTIVITAMIN CH
1.0000 | ORAL_TABLET | Freq: Every day | ORAL | Status: DC
  Administered 2012-02-20 – 2012-02-21 (×2): 1 via ORAL
  Filled 2012-02-19 (×2): qty 1

## 2012-02-19 MED ORDER — PHENYLEPHRINE 40 MCG/ML (10ML) SYRINGE FOR IV PUSH (FOR BLOOD PRESSURE SUPPORT): 80.0000 ug | PREFILLED_SYRINGE | INTRAVENOUS | Status: DC | PRN

## 2012-02-19 MED ORDER — ONDANSETRON HCL 4 MG PO TABS: 4.0000 mg | ORAL_TABLET | ORAL | Status: DC | PRN

## 2012-02-19 MED ORDER — DIPHENHYDRAMINE HCL 25 MG PO CAPS: 25.0000 mg | ORAL_CAPSULE | Freq: Four times a day (QID) | ORAL | Status: DC | PRN

## 2012-02-19 MED ORDER — EPHEDRINE 5 MG/ML INJ: 10.0000 mg | INTRAVENOUS | Status: DC | PRN

## 2012-02-19 MED ORDER — SENNOSIDES-DOCUSATE SODIUM 8.6-50 MG PO TABS
2.0000 | ORAL_TABLET | Freq: Every day | ORAL | Status: DC
  Administered 2012-02-19: 2 via ORAL

## 2012-02-19 MED ORDER — EPHEDRINE 5 MG/ML INJ
10.0000 mg | INTRAVENOUS | Status: DC | PRN
  Filled 2012-02-19: qty 4

## 2012-02-19 MED ORDER — OXYTOCIN 40 UNITS IN LACTATED RINGERS INFUSION - SIMPLE MED
1.0000 m[IU]/min | INTRAVENOUS | Status: DC
  Administered 2012-02-19: 2 m[IU]/min via INTRAVENOUS
  Filled 2012-02-19: qty 1000

## 2012-02-19 MED ORDER — ONDANSETRON HCL 4 MG/2ML IJ SOLN: 4.0000 mg | INTRAMUSCULAR | Status: DC | PRN

## 2012-02-19 MED ORDER — TETANUS-DIPHTH-ACELL PERTUSSIS 5-2.5-18.5 LF-MCG/0.5 IM SUSP
0.5000 mL | Freq: Once | INTRAMUSCULAR | Status: AC
  Administered 2012-02-20: 0.5 mL via INTRAMUSCULAR

## 2012-02-19 MED ORDER — BENZOCAINE-MENTHOL 20-0.5 % EX AERO: 1.0000 "application " | INHALATION_SPRAY | CUTANEOUS | Status: DC | PRN

## 2012-02-19 MED ORDER — LACTATED RINGERS IV SOLN: 500.0000 mL | Freq: Once | INTRAVENOUS | Status: DC

## 2012-02-19 MED ORDER — SIMETHICONE 80 MG PO CHEW: 80.0000 mg | CHEWABLE_TABLET | ORAL | Status: DC | PRN

## 2012-02-19 MED ORDER — FENTANYL 2.5 MCG/ML BUPIVACAINE 1/10 % EPIDURAL INFUSION (WH - ANES)
14.0000 mL/h | INTRAMUSCULAR | Status: DC
  Filled 2012-02-19: qty 60

## 2012-02-19 MED ORDER — ZOLPIDEM TARTRATE 5 MG PO TABS: 5.0000 mg | ORAL_TABLET | Freq: Every evening | ORAL | Status: DC | PRN

## 2012-02-19 MED ORDER — FENTANYL 2.5 MCG/ML BUPIVACAINE 1/10 % EPIDURAL INFUSION (WH - ANES)
INTRAMUSCULAR | Status: DC | PRN
  Administered 2012-02-19: 14 mL/h via EPIDURAL

## 2012-02-19 MED ORDER — DIPHENHYDRAMINE HCL 50 MG/ML IJ SOLN: 12.5000 mg | INTRAMUSCULAR | Status: DC | PRN

## 2012-02-19 MED ORDER — IBUPROFEN 600 MG PO TABS
600.0000 mg | ORAL_TABLET | Freq: Four times a day (QID) | ORAL | Status: DC
  Administered 2012-02-19 – 2012-02-21 (×6): 600 mg via ORAL
  Filled 2012-02-19 (×6): qty 1

## 2012-02-19 MED ORDER — LIDOCAINE HCL (PF) 1 % IJ SOLN
INTRAMUSCULAR | Status: DC | PRN
  Administered 2012-02-19: 8 mL

## 2012-02-19 MED ORDER — LANOLIN HYDROUS EX OINT: TOPICAL_OINTMENT | CUTANEOUS | Status: DC | PRN

## 2012-02-19 MED ORDER — WITCH HAZEL-GLYCERIN EX PADS: 1.0000 "application " | MEDICATED_PAD | CUTANEOUS | Status: DC | PRN

## 2012-02-19 MED ORDER — DIBUCAINE 1 % RE OINT: 1.0000 "application " | TOPICAL_OINTMENT | RECTAL | Status: DC | PRN

## 2012-02-19 MED ORDER — PHENYLEPHRINE 40 MCG/ML (10ML) SYRINGE FOR IV PUSH (FOR BLOOD PRESSURE SUPPORT)
80.0000 ug | PREFILLED_SYRINGE | INTRAVENOUS | Status: DC | PRN
  Filled 2012-02-19: qty 5

## 2012-02-19 MED ORDER — OXYCODONE-ACETAMINOPHEN 5-325 MG PO TABS: 1.0000 | ORAL_TABLET | ORAL | Status: DC | PRN

## 2012-02-19 NOTE — Progress Notes (Signed)
Rachelle Edwards is a 31 y.o. G2P0010 at [redacted]w[redacted]d admitted for active labor  Subjective: Contractions stronger on pitocin - would like IV pain medicine  Objective: BP 141/92  Pulse 66  Temp 97.9 F (36.6 C) (Oral)  Resp 24  Ht 5\' 2"  (1.575 m)  Wt 248 lb 8 oz (112.719 kg)  BMI 45.45 kg/m2  LMP 05/12/2011      FHT:  FHR: 145 bpm, variability: moderate,  accelerations:  Abscent,  decelerations:  Present variables UC:   regular, every 2-4 minutes SVE:   Dilation: 6 Effacement (%): 90 Station: 0 Exam by:: k fields, rn  Labs: Lab Results  Component Value Date   WBC 10.9* 02/18/2012   HGB 13.0 02/18/2012   HCT 38.5 02/18/2012   MCV 87.9 02/18/2012   PLT 187 02/18/2012    Assessment / Plan: Augmentation of labor, progressing well  Labor: Progressing on Pitocin Fetal Wellbeing:  Category II Pain Control:  Fentanyl I/D:  n/a Anticipated MOD:  NSVD  BOOTH, Colbi Schiltz 02/19/2012, 11:25 AM

## 2012-02-19 NOTE — Progress Notes (Signed)
Lakiyah Arntson is a 31 y.o. G2P0010 at [redacted]w[redacted]d   Subjective: Breathing with contractions; patient desires to walk around MAU instead of lying in bed  Objective: BP 112/70  Pulse 77  Temp 98.3 F (36.8 C) (Oral)  Resp 20  Ht 5\' 2"  (1.575 m)  Wt 248 lb 8 oz (112.719 kg)  BMI 45.45 kg/m2  LMP 05/12/2011      FHT:  FHR: 145 bpm, variability: moderate,  accelerations:  Present,  decelerations:  Absent UC:   regular, every 4-5 minutes SVE:  5/80/vtx/-2 AROM: clear fluid  Labs: Lab Results  Component Value Date   WBC 10.9* 02/18/2012   HGB 13.0 02/18/2012   HCT 38.5 02/18/2012   MCV 87.9 02/18/2012   PLT 187 02/18/2012    Assessment / Plan: Spontaneous labor, progressing normally  Labor: no change in 2 hours Preeclampsia:  n/a Fetal Wellbeing:  Category I Pain Control:  Labor support without medications I/D:  n/a Anticipated MOD:  NSVD  Re-check in 2 hours or PRN, consider augmentation.   Clinton Sawyer, Leonna Schlee 02/19/2012, 6:45 AM

## 2012-02-19 NOTE — Anesthesia Preprocedure Evaluation (Signed)
Anesthesia Evaluation  Patient identified by MRN, date of birth, ID band Patient awake    Reviewed: Allergy & Precautions, H&P , NPO status , Patient's Chart, lab work & pertinent test results  Airway Mallampati: II TM Distance: >3 FB Neck ROM: full    Dental No notable dental hx.    Pulmonary neg pulmonary ROS,    Pulmonary exam normal       Cardiovascular     Neuro/Psych negative neurological ROS  negative psych ROS   GI/Hepatic negative GI ROS, Neg liver ROS,   Endo/Other  Hypothyroidism Morbid obesity  Renal/GU negative Renal ROS  negative genitourinary   Musculoskeletal negative musculoskeletal ROS (+)   Abdominal (+) + obese,   Peds negative pediatric ROS (+)  Hematology negative hematology ROS (+)   Anesthesia Other Findings   Reproductive/Obstetrics (+) Pregnancy                           Anesthesia Physical Anesthesia Plan  ASA: III  Anesthesia Plan: Epidural   Post-op Pain Management:    Induction:   Airway Management Planned:   Additional Equipment:   Intra-op Plan:   Post-operative Plan:   Informed Consent: I have reviewed the patients History and Physical, chart, labs and discussed the procedure including the risks, benefits and alternatives for the proposed anesthesia with the patient or authorized representative who has indicated his/her understanding and acceptance.     Plan Discussed with:   Anesthesia Plan Comments:         Anesthesia Quick Evaluation

## 2012-02-19 NOTE — Progress Notes (Signed)
Subjective: Moderate contractions, about every 6 minutes  Objective: BP 132/87  Pulse 71  Temp 97.9 F (36.6 C) (Oral)  Resp 18  Ht 5\' 2"  (1.575 m)  Wt 112.719 kg (248 lb 8 oz)  BMI 45.45 kg/m2  LMP 05/12/2011      FHT:  FHR: 140 bpm, variability: moderate,  accelerations:  Abscent,  decelerations:  Present variable UC:   regular, every 6 minutes SVE:   Dilation: 5 Effacement (%): 80 Station: 0 Exam by:: k fields, rn  Labs: Lab Results  Component Value Date   WBC 10.9* 02/18/2012   HGB 13.0 02/18/2012   HCT 38.5 02/18/2012   MCV 87.9 02/18/2012   PLT 187 02/18/2012    Assessment / Plan: Category 2 tracing.  No cervical change despite AROM.  Contractions spaced out - will start pitocin.  Tonya Simmons JEHIEL 02/19/2012, 9:21 AM

## 2012-02-19 NOTE — Progress Notes (Signed)
Tonya Simmons is a 31 y.o. G2P0010 at [redacted]w[redacted]d admitted for active labor  Subjective: Comfortable with epidural  Objective: BP 151/109  Pulse 99  Temp 98 F (36.7 C) (Oral)  Resp 20  Ht 5\' 2"  (1.575 m)  Wt 248 lb 8 oz (112.719 kg)  BMI 45.45 kg/m2  SpO2 100%  LMP 05/12/2011      FHT:  FHR: 145 bpm, variability: moderate,  accelerations:  Abscent,  decelerations:  Present variables UC:   regular, every 2 minutes SVE:   Dilation: 10 Effacement (%): 90 Station: 0 Exam by:: Vaidehi Braddy booth, md  Labs: Lab Results  Component Value Date   WBC 10.9* 02/18/2012   HGB 13.0 02/18/2012   HCT 38.5 02/18/2012   MCV 87.9 02/18/2012   PLT 187 02/18/2012    Assessment / Plan: Augmentation of labor, progressing well  Labor: progressing on pitocin.  Dr. Clinton Sawyer called and on his way. Fetal Wellbeing:  Category II Pain Control:  Epidural I/D:  n/a Anticipated MOD:  NSVD  Tonya Simmons, Tonya Simmons 02/19/2012, 3:03 PM

## 2012-02-19 NOTE — Progress Notes (Signed)
Called Dr. Arby Barrette with most recent platelet count of 167. Order received to remove epidural catheter. Will call L&D RN to remove catheter and continue to monitor patient.

## 2012-02-19 NOTE — Progress Notes (Signed)
Epidural removed intact @2015 

## 2012-02-19 NOTE — Progress Notes (Signed)
Tonya Simmons is a 31 y.o. G2P0010 at [redacted]w[redacted]d   Subjective: Breathing with contractions  Objective: BP 114/88  Pulse 88  Temp 97.7 F (36.5 C) (Oral)  Resp 20  Ht 5\' 2"  (1.575 m)  Wt 248 lb 8 oz (112.719 kg)  BMI 45.45 kg/m2  LMP 05/12/2011      FHT:  FHR: 145 bpm, variability: moderate,  accelerations:  Present,  decelerations:  Absent UC:   regular, every 7 minutes SVE:  5/80/vtx/-2 AROM: clear fluid  Labs: Lab Results  Component Value Date   WBC 10.9* 02/18/2012   HGB 13.0 02/18/2012   HCT 38.5 02/18/2012   MCV 87.9 02/18/2012   PLT 187 02/18/2012    Assessment / Plan: Spontaneous labor, progressing normally  Labor: Progressing normally Preeclampsia:  n/a Fetal Wellbeing:  Category I Pain Control:  Labor support without medications I/D:  n/a Anticipated MOD:  NSVD  Will re-eval in 2 hours or prn.  Marita Burnsed E. 02/19/2012, 5:06 AM

## 2012-02-19 NOTE — Anesthesia Procedure Notes (Signed)
Epidural Patient location during procedure: OB Start time: 02/19/2012 1:42 PM End time: 02/19/2012 1:55 PM Reason for block: procedure for pain  Staffing Anesthesiologist: Sandrea Hughs Performed by: anesthesiologist   Preanesthetic Checklist Completed: patient identified, site marked, surgical consent, pre-op evaluation, timeout performed, IV checked, risks and benefits discussed and monitors and equipment checked  Epidural Patient position: sitting Prep: site prepped and draped and DuraPrep Patient monitoring: continuous pulse ox and blood pressure Approach: midline Injection technique: LOR air  Needle:  Needle type: Tuohy  Needle gauge: 17 G Needle length: 9 cm Needle insertion depth: 6 cm Catheter type: closed end flexible Catheter size: 19 Gauge Catheter at skin depth: 11 cm Test dose: negative and Other  Assessment Sensory level: T10 Events: blood not aspirated, injection not painful, no injection resistance, negative IV test and no paresthesia

## 2012-02-20 ENCOUNTER — Encounter: Payer: Self-pay | Admitting: Physician Assistant

## 2012-02-20 LAB — CBC
HCT: 32.3 % — ABNORMAL LOW (ref 36.0–46.0)
Hemoglobin: 10.8 g/dL — ABNORMAL LOW (ref 12.0–15.0)
MCH: 29.8 pg (ref 26.0–34.0)
MCHC: 33.4 g/dL (ref 30.0–36.0)
MCV: 89 fL (ref 78.0–100.0)
Platelets: 161 10*3/uL (ref 150–400)
RBC: 3.63 MIL/uL — ABNORMAL LOW (ref 3.87–5.11)
RDW: 13.5 % (ref 11.5–15.5)
WBC: 12.7 10*3/uL — ABNORMAL HIGH (ref 4.0–10.5)

## 2012-02-20 NOTE — Progress Notes (Signed)
UR chart review completed.  

## 2012-02-20 NOTE — Progress Notes (Signed)
Post Partum Day 1  Subjective: no complaints, up ad lib, voiding, tolerating PO and + flatus  Objective: Temp:  [97.8 F (36.6 C)-98.9 F (37.2 C)] 97.9 F (36.6 C) (08/08 0628) Pulse Rate:  [66-116] 87  (08/08 0628) Resp:  [18-24] 20  (08/08 0628) BP: (101-157)/(59-109) 114/79 mmHg (08/08 0628) SpO2:  [97 %-100 %] 98 % (08/07 1845)    Physical Exam:  General: alert, cooperative and appears stated age Lochia: appropriate Uterine Fundus: firm Incision: no applicable DVT Evaluation: No evidence of DVT seen on physical exam.   Basename 02/20/12 0535 02/19/12 1945  HGB 10.8* 11.0*  HCT 32.3* 32.2*    Assessment/Plan: Plan for discharge tomorrow, Breastfeeding and Lactation consult   LOS: 2 days   Mat Carne 02/20/2012, 7:30 AM   I have seen and examined patient and agree with above. Lactation consult needed. Camillia Herter Thad Ranger, MD OB-Gyn Fellow

## 2012-02-20 NOTE — Anesthesia Postprocedure Evaluation (Signed)
  Anesthesia Post Note  Patient: Tonya Simmons  Procedure(s) Performed: * No procedures listed *  Anesthesia type: Epidural  Patient location: Mother/Baby  Post pain: Pain level controlled  Post assessment: Post-op Vital signs reviewed  Last Vitals:  Filed Vitals:   02/20/12 0628  BP: 114/79  Pulse: 87  Temp: 36.6 C  Resp: 20    Post vital signs: Reviewed  Level of consciousness:alert  Complications: No apparent anesthesia complications

## 2012-02-21 MED ORDER — IBUPROFEN 600 MG PO TABS: 600.0000 mg | ORAL_TABLET | Freq: Four times a day (QID) | ORAL | Status: AC

## 2012-02-21 NOTE — Discharge Summary (Signed)
Obstetric Discharge Summary Reason for Admission: onset of labor Prenatal Procedures: none Intrapartum Procedures: spontaneous vaginal delivery Postpartum Procedures: none Complications-Operative and Postpartum: none Hemoglobin  Date Value Range Status  02/20/2012 10.8* 12.0 - 15.0 g/dL Final     HCT  Date Value Range Status  02/20/2012 32.3* 36.0 - 46.0 % Final    Physical Exam:  General: alert, cooperative, appears stated age and no distress Lochia: appropriate Uterine Fundus: firm Incision: none DVT Evaluation: No evidence of DVT seen on physical exam.  Discharge Diagnoses: Term Pregnancy-delivered  Discharge Information: Date: 02/21/2012 Activity: unrestricted Diet: routine Medications: None Condition: stable Instructions: refer to practice specific booklet Discharge to: home   Newborn Data: Live born female  Birth Weight: 7 lb 4.6 oz (3305 g) APGAR: 8, 9  Home with mother.  Mat Carne MD 02/21/2012, 7:41 AM  I have seen this patient and agree with the above resident's note.  LEFTWICH-KIRBY, Kostas Marrow Certified Nurse-Midwife

## 2012-02-25 NOTE — H&P (Signed)
Chart reviewed and agree with management and plan.  

## 2012-03-19 ENCOUNTER — Ambulatory Visit: Payer: Self-pay | Admitting: Obstetrics and Gynecology

## 2012-04-06 ENCOUNTER — Encounter: Payer: Self-pay | Admitting: Advanced Practice Midwife

## 2012-04-06 ENCOUNTER — Ambulatory Visit (INDEPENDENT_AMBULATORY_CARE_PROVIDER_SITE_OTHER): Payer: Self-pay | Admitting: Advanced Practice Midwife

## 2012-04-06 NOTE — Patient Instructions (Addendum)
Cuidados luego de un parto por va vaginal (Postpartum Care After Vaginal Delivery) Tia Alert del nacimiento del beb deber permanecer en el hospital durante 24 a 72 horas, excepto que hubiera existido algn problema, o usted sufra alguna enfermedad. Mientras se encuentre en el hospital recibir ayuda e instrucciones por parte de las enfermeras y el mdico, quienes cuidarn de usted y su beb y Chief Executive Officer darn consejos para Metallurgist, especialmente si es Financial risk analyst hijo.  En caso de ser necesario, le prescribirn analgsicos. Observar una pequea hemorragia vaginal y deber cambiar los apsitos con frecuencia. Lvese las manos cuidadosamente con agua y jabn durante al menos 20 segundos luego de cambiarse el apsito o ir al bao. Si elimina cogulos o aumenta la hemorragia, infrmelo a la enfermera. No deseche los cogulos sanguneos antes de mostrrselos a la enfermera, para asegurarse de que no es tejido Geologist, engineering. Si le han colocado una va intravenosa, se la retirarn dentro de las 24 horas, si no hay problemas. La primera vez que se levante de la cama o tome una ducha, llame a la enfermera para que la ayude que puede sentirse dbil, mareada o [place]. Si est amamantando, puede sentir contracciones dolorosas en el tero durante algunas semanas. Esto es normal y Medical sales representative, ya que de este modo el tero vuelve a su tamao normal. Si no est amamantando, utilice un sostn de soporte y trate de no tocarse las [employer] que haya dejado de producir Veguita. No deben administrarse hormonas para suprimir la [place], debido a que pueden causar cogulos sanguneos. Podr seguir una dieta normal, excepto que sufra diabetes o presente otros problemas de [place].  La enfermera colocar bolsas con hielo en el sitio de la episiotoma (agrandamiento quirrgico de la apertura vaginal) para reducir Chief Technology Officer y la hinchazn. En algunos casos raros hay dificultad para orinar, entonces la enfermera deber vaciarle la  vejiga con un catter. Si le han practicado una ligadura tubaria durante el posparto ("trompas atadas", esterilizacin femenina), esto no har que permanezca ms [employer] hospital. Podr tener al beb en su habitacin todo el tiempo que lo desee si el beb no tiene ningn problema. Lleve y traiga al beb de la nursery dentro de la [place]. No lo lleve en brazos. No abandone el rea de posparto. Si la madre es Rh negativa (falta de una protena en los glbulos rojos) y el beb es Rh positivo, la madre debe aplicarse la vacuna RhoGam para evitar problemas con el factor Rh en futuros embarazos Le darn instrucciones por escrito para usted y el beb y los medicamentos necesarios cuando reciba el alta mdica. Asegrese que comprende y sigue las indicaciones. INSTRUCCIONES PARA EL CUIDADO DOMICILIARIO  Siga las instrucciones y tome los medicamentos que le indicaron cuando le dieron el alta mdica.   Utilice los medicamentos de venta libre o de prescripcin para Chief Technology Officer, Environmental health practitioner o la [place], segn se lo indique el profesional que lo asiste.   No tome aspirina, ya que puede causar hemorragias.   Aumente sus actividades un poco cada da para tener ms fuerza y Hydrographic surveyor.   No beba alcohol, especialmente si est amamantando o toma analgsicos.   Tmese la [employer] veces por da y Engineering geologist.   Podr tener una pequea hemorragia durante 2 a 4 semanas. Esto es normal.   No utilice tampones o duchas vaginales, use toallas higinicas.   Trate de que Therapist, nutritional con usted y la ayude durante los primeros das en el hogar.  Descanse o duerma una siesta cuando el beb duerma.   Si est amamantando, use un buen sostn. Si no est amamantando, use un buen sostn y no estimule los pezones.   Consuma una dieta sana y siga tomando las vitaminas prenatales.   No conduzca vehculos, no realice actividades pesadas ni viaje hasta que su mdico la autorice.   No mantenga relaciones  sexuales hasta que el mdico lo permita.   Consulte con el profesional cuando puede comenzar a Education officer, environmental actividad fsica y que tipo de Glass blower/designer.   Comunquese inmediatamente con el mdico si tiene problemas luego del [place].   Comunquese con el pediatra si tiene problemas con el beb.   Programe su visita de control luego del parto y cmplala.  SOLICITE ATENCIN MDICA SI:  La temperatura se eleva por encima de 100 F (37.8 C).   Aumenta la hemorragia vaginal o elimina cogulos. Conserve algunos cogulos para mostrrselos al mdico.   Anola Gurney sangre o siente dolor al Geographical information systems officer.   Presenta secrecin vaginal con olor ftido.   Aumenta el dolor o la inflamacin en el sitio de la episiotoma (agrandamiento quirrgico de la apertura vaginal).   Sufre una cefalea grave.   Se siente deprimida.   La incisin se abre.   Se siente mareada o sufre un desmayo.   Aparece una erupcin cutnea.   Tiene una reaccin o problemas con su medicamento.   Siente dolor u observa enrojecimiento e hinchazn en el sitio de la va intravenosa.  SOLICITE ATENCIN MDICA DE INMEDIATO SI:  Siente dolor en el pecho.   Comienza a sentir falta de aire.   Se desmaya.   Siente dolor, con o sin hinchazn e irritacin en la pierna.   Tiene una hemorragia vaginal abundante, con o sin cogulos   IT consultant.   Brett Fairy secrecin vaginal con mal olor.  ASEGURESE QUE:   Comprende estas instrucciones.   Controlar su enfermedad.   Solicitar ayuda de inmediato si no mejora o empeora.  Document Released: 04/28/2007 Document Revised: 06/20/2011 [facility] Patient Information 2012 [place], [place].Postpartum Care After Vaginal Delivery After you deliver your baby, you will stay in the hospital for 24 to 72 hours, unless there were problems with the labor or delivery, or you have medical problems. While you are in the hospital, you will receive help and instructions on how to  care for yourself and your baby. Your doctor will order pain medicine, in case you need it. You will have a small amount of bleeding from your vagina and should change your sanitary pad frequently. Wash your hands thoroughly with soap and water for at least 20 seconds after changing pads and using the toilet. Let the nurses know if you begin to pass blood clots or your bleeding increases. Do not flush blood clots down the toilet before having the nurse look at them, to make sure there is no placental tissue with them. If you had an intravenous (IV), it will be removed within 24 hours, if there are no problems. The first time you get out of bed or take a shower, call the nurse to help you because you may get weak, lightheaded, or even faint. If you are breastfeeding, you may feel painful contractions of your uterus for a couple of weeks. This is normal. The contractions help your uterus get back to normal size. If you are not breastfeeding, wear a supportive bra and handle your breasts as little as possible until your milk  has dried up. Hormones should not be given to dry up the breasts, because they can cause blood clots. You will be given your normal diet, unless you have diabetes or other medical problems.  The nurses may put an ice pack on your episiotomy (surgically enlarged opening), if you have one, to reduce the pain and swelling. On rare occasions, you may not be able to urinate and the nurse will need to empty your bladder with a catheter. If you had a postpartum tubal ligation ("tying tubes," female sterilization), it should not make your stay in the hospital longer. You may have your baby in your room with you as much as you like, unless you or the baby has a problem. Use the bassinet (basket) for the baby when going to and from the nursery. Do not carry the baby. Do not leave the postpartum area. If the mother is Rh negative (lacks a protein on the red blood cells) and the baby is Rh positive, the  mother should get a Rho-gam shot to prevent Rh problems with future pregnancies. You may be given written instructions for you and your baby, and necessary medicines, when you are discharged from the hospital. Be sure you understand and follow the instructions as advised. HOME CARE INSTRUCTIONS   Follow instructions and take the medicines given to you.   Only take over-the-counter or prescription medicines for pain, discomfort, or fever as directed by your caregiver.   Do not take aspirin, because it can cause bleeding.   Increase your activities a little bit every day to build up your strength and endurance.   Do not drink alcohol, especially if you are breastfeeding or taking pain medicine.   Take your temperature twice a day and record it.   You may have a small amount of bleeding or spotting for 2 to 4 weeks. This is normal.   Do not use tampons or douche. Use sanitary pads.   Try to have someone stay and help you for a few days when you go home.   Try to rest or take a nap when the baby is sleeping.   If you are breastfeeding, wear a good support bra. If you are not breastfeeding, wear a supportive bra and do not stimulate your nipples.   Eat a healthy, nutritious diet and continue to take your prenatal vitamins.   Do not drive, do any heavy activities, or travel until your caregiver tells you it is okay.   Do not have intercourse until your caregiver gives you permission to do so.   Ask your caregiver when you can begin to exercise and what type of exercises to do.   Call your caregiver if you think you are having a problem from your delivery.   Call your pediatrician if you are having a problem with the baby.   Schedule your postpartum visit and keep it.  SEEK MEDICAL CARE IF:   You have a temperature of 100 F (37.8 C) or higher.   You have increased vaginal bleeding or are passing clots. Save any clots to show your caregiver.   You have bloody urine or pain when  you urinate.   You have a bad smelling vaginal discharge.   You have increasing pain or swelling on your episiotomy.   You develop a severe headache.   You feel depressed.   The episiotomy is separating.   You become dizzy or lightheaded.   You develop a rash.   You have a reaction  or problems with your medicine.   You have pain, redness, or swelling at the intravenous site.  SEEK IMMEDIATE MEDICAL CARE IF:   You have chest pain.   You develop shortness of breath.   You pass out.   You develop pain, with or without swelling or redness in your leg.   You develop heavy vaginal bleeding, with or without blood clots.   You develop stomach pain.   You develop a bad smelling vaginal discharge.  MAKE SURE YOU:   Understand these instructions.   Will watch your condition.   Will get help right away if you are not doing well or get worse.  Document Released: 04/28/2007 Document Revised: 06/20/2011 Document Reviewed: 05/10/2009 [facility] Patient Information 2012 [place], [place].

## 2012-04-06 NOTE — Progress Notes (Signed)
  Subjective:     Tonya Simmons is a 31 y.o. female who presents for a postpartum visit. She is 6 weeks postpartum following a spontaneous vaginal delivery. I have fully reviewed the prenatal and intrapartum course. The delivery was at term. Outcome: spontaneous vaginal delivery. Anesthesia: epidural. Postpartum course has been uneventful. Baby's course has been uneventful. Baby is feeding by breast. Bleeding staining only. Bowel function is normal. Bladder function is normal. Patient is not sexually active. Contraception method is condoms.  Is NOT interested in any other form of contraception. Postpartum depression screening: negative.  The following portions of the patient's history were reviewed and updated as appropriate: allergies, current medications, past family history, past medical history, past social history, past surgical history and problem list.  Review of Systems Pertinent items are noted in HPI.   Objective:    BP 122/86  Pulse 81  Temp 97 F (36.1 C) (Oral)  Resp 12  Ht 5' 1.5" (1.562 m)  Wt 240 lb 9.6 oz (109.135 kg)  BMI 44.72 kg/m2  LMP 04/05/2012  Breastfeeding? Yes  General:  alert and no distress   Breasts:  inspection negative, no nipple discharge or bleeding, no masses or nodularity palpable  Lungs:   Heart:    Abdomen: soft, non-tender; bowel sounds normal; no masses,  no organomegaly   Vulva:  normal  Vagina: normal vagina  Cervix:  no cervical motion tenderness  Corpus: normal  Adnexa:  normal adnexa  Rectal Exam: Not performed.        Assessment:    Normal postpartum exam. Pap smear not done at today's visit.   Plan:    1. Contraception: condoms  Advised to add spermacide to increase effectiveness 2. Resume normal activity 3. Follow up in: 1 year or as needed.

## 2012-05-15 ENCOUNTER — Telehealth: Payer: Self-pay | Admitting: Family Medicine

## 2012-05-15 NOTE — Telephone Encounter (Signed)
Pt has an appt on 11/27 for THS please pt needs lab before appt' day. Thanks  MJ

## 2012-05-15 NOTE — Telephone Encounter (Signed)
Pt came to request  OC please call pt when you are ready. Thank You! MJ

## 2012-06-10 ENCOUNTER — Ambulatory Visit (INDEPENDENT_AMBULATORY_CARE_PROVIDER_SITE_OTHER): Payer: No Typology Code available for payment source | Admitting: Family Medicine

## 2012-06-10 VITALS — BP 120/86 | HR 80 | Temp 98.6°F | Ht 61.5 in | Wt 246.0 lb

## 2012-06-10 DIAGNOSIS — R5383 Other fatigue: Secondary | ICD-10-CM

## 2012-06-10 DIAGNOSIS — R5381 Other malaise: Secondary | ICD-10-CM

## 2012-06-10 DIAGNOSIS — E039 Hypothyroidism, unspecified: Secondary | ICD-10-CM

## 2012-06-10 MED ORDER — LEVOTHYROXINE SODIUM 25 MCG PO TABS: 25.0000 ug | ORAL_TABLET | Freq: Every day | ORAL | Status: DC

## 2012-06-10 NOTE — Progress Notes (Signed)
  Subjective:    Patient ID: Tonya Simmons, female    DOB: 12-27-1980, 31 y.o.   MRN: [id number]  HPI  31 year old F who is 3.5 months post partum who presents today for evaluation of fatigue and weight gain. It is accompanied by decreased mood. She has not taken thyroid medication in 2 months, because she ran out of the prescription after her delivery.   Review of Systems Negative for SI/HI, problems producing breast milk, heat or cold intolerance, skin changes, hair changes, constipation or diarrhea     Objective:   Physical Exam BP 120/86  Pulse 80  Temp 98.6 F (37 C) (Oral)  Ht 5' 1.5" (1.562 m)  Wt 246 lb (111.585 kg)  BMI 45.73 kg/m2 Gen: Hispanic female, obese, non distressed, pleasant and conversant HEENT: No thyromegaly or nodule, no lymphadenopathy Cv: RRR, no m/r/g Pulm: CTA-B Abd: obese Extr: no edema     Assessment & Plan:  31 year old F with fatigue and decreased mood who could have hypothyroidism or possible post-partum depression.

## 2012-06-11 LAB — TSH: TSH: 2.417 u[IU]/mL (ref 0.350–4.500)

## 2012-06-16 ENCOUNTER — Encounter: Payer: Self-pay | Admitting: Family Medicine

## 2012-06-16 DIAGNOSIS — R5383 Other fatigue: Secondary | ICD-10-CM | POA: Insufficient documentation

## 2012-06-16 NOTE — Assessment & Plan Note (Signed)
Restart levothyroxine at 25 mcg daily. Check TSH. Recheck in 1 month.

## 2012-07-10 ENCOUNTER — Ambulatory Visit (INDEPENDENT_AMBULATORY_CARE_PROVIDER_SITE_OTHER): Payer: No Typology Code available for payment source | Admitting: Family Medicine

## 2012-07-10 VITALS — BP 127/87 | HR 77 | Temp 98.4°F | Ht 61.5 in | Wt 249.0 lb

## 2012-07-10 DIAGNOSIS — G44209 Tension-type headache, unspecified, not intractable: Secondary | ICD-10-CM

## 2012-07-10 DIAGNOSIS — Z862 Personal history of diseases of the blood and blood-forming organs and certain disorders involving the immune mechanism: Secondary | ICD-10-CM

## 2012-07-10 DIAGNOSIS — Z8639 Personal history of other endocrine, nutritional and metabolic disease: Secondary | ICD-10-CM

## 2012-07-10 NOTE — Progress Notes (Signed)
  Subjective:    Patient ID: Tonya Simmons, female    DOB: May 12, 1981, 31 y.o.   MRN: [id number]  HPI  31 year old F who presents for f/u of thyroid disease. She notes that she was diagnosed with a thyroid problem by Dr. Excell Seltzer 2 years ago and started on levothyroxine. At the same time, she was diagnosed with depression and started on an anti-depressant that she cannot remember. She stopped taking both medications when she became pregnant with her son approximately 13 months ago. Currently, she notes mild stress but denies depression. She is fatigued, but her most bothersome symptoms is a bilateral headache. 2 weeks in duration. It is not accompanied by changes in vision, nausea, vomiting, or lacrimation. She has not tried anything to relieve the pain. She does not know any triggers for the pain.   Review of Systems See HPI    Objective:   Physical Exam BP 127/87  Pulse 77  Temp 98.4 F (36.9 C) (Oral)  Ht 5' 1.5" (1.562 m)  Wt 249 lb (112.946 kg)  BMI 46.29 kg/m2  LMP 05/10/2012 Lab Results  Component Value Date   TSH 2.417 06/10/2012    Gen: obese, Hispanic age female HEENT: NCAT, PERRLA, CN II-XII grossly intact Neck: no thyroid nodules or tenderness CV: RRR, no m,r,g Skin: no rashes     Assessment & Plan:  31 year old F w generalized a tension type headache and generalized symptoms akin to depression. However, the patient denies depression and will seek assistance for treatment if needed.

## 2012-07-14 DIAGNOSIS — G44209 Tension-type headache, unspecified, not intractable: Secondary | ICD-10-CM | POA: Insufficient documentation

## 2012-07-14 NOTE — Assessment & Plan Note (Signed)
Patient reported and never confirmed. Hx of taking only 25 mcg of levothyroxine. May have been adjunct treatment for depression. No evidence of hypothyroidism, so stop levothyroxine.

## 2012-07-14 NOTE — Assessment & Plan Note (Signed)
To use otc NSAIDS PRN. No red flags for imaging.

## 2012-08-29 ENCOUNTER — Other Ambulatory Visit: Payer: Self-pay

## 2013-01-08 ENCOUNTER — Ambulatory Visit (INDEPENDENT_AMBULATORY_CARE_PROVIDER_SITE_OTHER): Payer: No Typology Code available for payment source | Admitting: Family Medicine

## 2013-01-08 VITALS — BP 124/84 | HR 81 | Temp 98.6°F | Ht 61.5 in | Wt 247.0 lb

## 2013-01-08 DIAGNOSIS — Z862 Personal history of diseases of the blood and blood-forming organs and certain disorders involving the immune mechanism: Secondary | ICD-10-CM

## 2013-01-08 DIAGNOSIS — Z8639 Personal history of other endocrine, nutritional and metabolic disease: Secondary | ICD-10-CM

## 2013-01-08 DIAGNOSIS — E669 Obesity, unspecified: Secondary | ICD-10-CM

## 2013-01-11 ENCOUNTER — Encounter: Payer: Self-pay | Admitting: Family Medicine

## 2013-01-11 NOTE — Assessment & Plan Note (Signed)
Still no clinical evidence of hypothyroidism and no need to repeat TSH at this time since it was normal < 1 year ago. Continue to follow.

## 2013-01-11 NOTE — Assessment & Plan Note (Signed)
Patient counseled on importance of exercise. Encouraged to exercise 5x per week approximately 30 mins per day. Patient states she is able to do this and will ask her sister to join her. She will also try to eat at regular intervals every 4 hours to decrease likelihood of overeating at one meal. Will follow up in 1 month.

## 2013-01-11 NOTE — Progress Notes (Signed)
  Subjective:    Patient ID: Tonya Simmons, female    DOB: October 22, 1980, 32 y.o.   MRN: [id number]  HPI  32 year old F who presents for evaluation of possible hypothyroidism and obesity. Visit conducted in Spanish.   History of thyroid disorder - Notes that she was diagnosed with a thyroid problem by previous PCP Dr. Excell Seltzer several years ago and started on levothyroxine 25 mcg daily. She stopped this when she became pregnant with her son in 2012. After her delivery, she presented for follow on this matter in December 2013. A the time her TSH was 2.417 after being of Levothyroxine for > 6 months. She presents again to see if she needs evaluation for her thyroid. She denies any increased fatigue, hair loss, cold intolerance, weakness, skin changes, or depression.   Obesity - Patient is concerned about her weight. Always been obese. She is interested in losing weight and her  Motivation is to be healthy for her son. She currently does not exercise. She also have irregular times eating meals throughout the day, which she believes that she can improve.    Review of Systems     Objective:   Physical Exam BP 124/84  Pulse 81  Temp(Src) 98.6 F (37 C) (Oral)  Ht 5' 1.5" (1.562 m)  Wt 247 lb (112.038 kg)  BMI 45.92 kg/m2 Gen: obese, non ill appearing, pleasant and conversant  HEENT: normal hair, no periorbital edema, no macroglossia Skin: no dryness or rashes Psych: dynamic affect, normal thought content and speech pattern      Assessment & Plan:

## 2013-02-25 ENCOUNTER — Encounter: Payer: Self-pay | Admitting: Family Medicine

## 2013-02-25 ENCOUNTER — Ambulatory Visit (INDEPENDENT_AMBULATORY_CARE_PROVIDER_SITE_OTHER): Payer: Self-pay | Admitting: Family Medicine

## 2013-02-25 VITALS — BP 132/92 | HR 86 | Ht 61.5 in | Wt 243.0 lb

## 2013-02-25 DIAGNOSIS — N912 Amenorrhea, unspecified: Secondary | ICD-10-CM

## 2013-02-25 DIAGNOSIS — Z3201 Encounter for pregnancy test, result positive: Secondary | ICD-10-CM

## 2013-02-25 DIAGNOSIS — O0990 Supervision of high risk pregnancy, unspecified, unspecified trimester: Secondary | ICD-10-CM | POA: Insufficient documentation

## 2013-02-25 LAB — POCT URINE PREGNANCY: Preg Test, Ur: POSITIVE

## 2013-02-25 NOTE — Progress Notes (Signed)
  Subjective:    Patient ID: Tonya Simmons, female    DOB: 1980/10/23, 32 y.o.   MRN: [id number]  HPI  32 year old obese female presents for pregnancy testing. LMP June 8th. Last unprotected intercourse July 11th. Nausea and vomiting present yestserday. She is taking prenatal vitamins. No alcohol or tobacco products. No recreational drugs.   Review of Systems     Objective:   Physical Exam BP 132/92  Pulse 86  Ht 5' 1.5" (1.562 m)  Wt 243 lb (110.224 kg)  BMI 45.18 kg/m2  Gen: obese Hispanic female, non ill appearing, pleasant and conversant   Urine pregnancy test positive.      Assessment & Plan:

## 2013-02-25 NOTE — Patient Instructions (Signed)
Felicades. Esta embarazada. Haga una cita para el laboratorio, entonces, hage una cita conmigo para su primer chequeo de Biochemist, clinical.   Nos Vemos [place]!  Dr. Clinton Sawyer

## 2013-02-25 NOTE — Assessment & Plan Note (Signed)
Patient with positive pregnancy test. So, G3P0011 @ approximately [redacted]w[redacted]d EGA based on LMP. Encouraged to continue prenatal vitamins and avoid the following: alcohol, tobacco, recreational drugs, cat litter, deli meat, un pasteurized cheese. Patient will register for adopt-a-mom and return for first visit. At that time, she will need early glucola given obesity.

## 2013-03-19 ENCOUNTER — Other Ambulatory Visit: Payer: No Typology Code available for payment source

## 2013-03-19 DIAGNOSIS — Z331 Pregnant state, incidental: Secondary | ICD-10-CM

## 2013-03-19 LAB — HIV ANTIBODY (ROUTINE TESTING W REFLEX): HIV: NONREACTIVE

## 2013-03-19 NOTE — Progress Notes (Signed)
OB LABS DONE TODAY Vivienne Sangiovanni 

## 2013-03-20 LAB — OBSTETRIC PANEL
Antibody Screen: NEGATIVE
Basophils Absolute: 0 10*3/uL (ref 0.0–0.1)
Basophils Relative: 0 % (ref 0–1)
Eosinophils Absolute: 0.1 10*3/uL (ref 0.0–0.7)
Eosinophils Relative: 1 % (ref 0–5)
HCT: 36.5 % (ref 36.0–46.0)
Hemoglobin: 12.6 g/dL (ref 12.0–15.0)
Hepatitis B Surface Ag: NEGATIVE
Lymphocytes Relative: 30 % (ref 12–46)
Lymphs Abs: 2.1 10*3/uL (ref 0.7–4.0)
MCH: 29.5 pg (ref 26.0–34.0)
MCHC: 34.5 g/dL (ref 30.0–36.0)
MCV: 85.5 fL (ref 78.0–100.0)
Monocytes Absolute: 0.6 10*3/uL (ref 0.1–1.0)
Monocytes Relative: 9 % (ref 3–12)
Neutro Abs: 4.3 10*3/uL (ref 1.7–7.7)
Neutrophils Relative %: 60 % (ref 43–77)
Platelets: 187 10*3/uL (ref 150–400)
RBC: 4.27 MIL/uL (ref 3.87–5.11)
RDW: 13.5 % (ref 11.5–15.5)
Rh Type: POSITIVE
Rubella: 2.61 Index — ABNORMAL HIGH (ref <0.90)
WBC: 7.1 10*3/uL (ref 4.0–10.5)

## 2013-03-21 LAB — CULTURE, OB URINE: Colony Count: 35000

## 2013-03-26 ENCOUNTER — Encounter: Payer: Self-pay | Admitting: Family Medicine

## 2013-03-26 ENCOUNTER — Ambulatory Visit (INDEPENDENT_AMBULATORY_CARE_PROVIDER_SITE_OTHER): Payer: No Typology Code available for payment source | Admitting: Family Medicine

## 2013-03-26 VITALS — BP 112/78 | Wt 247.7 lb

## 2013-03-26 DIAGNOSIS — N898 Other specified noninflammatory disorders of vagina: Secondary | ICD-10-CM

## 2013-03-26 DIAGNOSIS — E669 Obesity, unspecified: Secondary | ICD-10-CM

## 2013-03-26 DIAGNOSIS — R110 Nausea: Secondary | ICD-10-CM

## 2013-03-26 DIAGNOSIS — Z3400 Encounter for supervision of normal first pregnancy, unspecified trimester: Secondary | ICD-10-CM

## 2013-03-26 DIAGNOSIS — E079 Disorder of thyroid, unspecified: Secondary | ICD-10-CM

## 2013-03-26 LAB — POCT WET PREP (WET MOUNT)
Clue Cells Wet Prep Whiff POC: NEGATIVE
WBC, Wet Prep HPF POC: >20

## 2013-03-26 LAB — TSH: TSH: 1.643 u[IU]/mL (ref 0.350–4.500)

## 2013-03-26 LAB — GLUCOSE, CAPILLARY: Glucose-Capillary: 162 mg/dL — ABNORMAL HIGH (ref 70–99)

## 2013-03-26 MED ORDER — ONDANSETRON 4 MG PO TBDP: 4.0000 mg | ORAL_TABLET | Freq: Three times a day (TID) | ORAL | Status: DC | PRN

## 2013-03-26 NOTE — Patient Instructions (Addendum)
Felicitaciones por Firefighter . Estoy muy feliz por ti . El beb era visible hoy en la ecografa , pero es necesario tener un ultrasonido ms detallado . Voy a hacer un pedido para que . Adems, voy a hacerle saber los [address] de la prueba de [place] . Por favor, el seguimiento en 4 semanas o antes si es necesario .  Por favor, evite lo siguiente durante el embarazo : - Carnes fras - Queso fresco - Arena para gatos  Dr. Clinton Sawyer

## 2013-03-26 NOTE — Progress Notes (Signed)
32 year old F G3P1011 with new OB visit at [redacted]w[redacted]d per LMP. Only complaint today is nausea. Not feeling baby move much.   Visit conducted in Spanish by me  O: BP 112/78  Wt 247 lb 11.2 oz (112.356 kg)  BMI 46.05 kg/m2  LMP 11/19/2012  Gen: very obese Hispanic female, well appearing, NAD, pleasant and conversant HEENT: NCAT, PERRLA, EOMI, OP clear and moist, no lymphadenopathy, no thyroid tenderness, enlargement, or nodules CV: RRR, no m/r/g, no JVD or carotid bruits Pulm: normal WOB, CTA-B Abd: soft, NDNT, NABS Extremities: no edema or joint tenderness Skin: warm, dry, no rashes Neuro/Psych: A&Ox4, normal affect, speech, and thought content GU: > External: no lesions > Vagina: thick white vaginal discharge > Cervix: no lesion; no mucopurulent d/c; no motion tenderness > Uterus: small, mobile > Adnexa: no masses; non tender    Bedside ultrasound - single intrauterine fetus noted with continuous movements and presence of cardiac activity  A/P: 32 year old F G3P1011 with new OB visit at [redacted]w[redacted]d per LMP. 1. Obesity - Have early glucose screening today, Will counsel on exercise program following visits 2. Questionable Hx of hypothyroid - unlikely diagnosis, actually given levothyroxine by previous PCP for depression, will check TSH just to be sure 3. Order placed for anatomy ultrasound 4. Genetic Testing - patient will speak with husband 5. Vaccinations - patient will need flu vaccination at upcoming visit 6. followup in 4 weeks

## 2013-03-29 ENCOUNTER — Other Ambulatory Visit (INDEPENDENT_AMBULATORY_CARE_PROVIDER_SITE_OTHER): Payer: No Typology Code available for payment source

## 2013-03-29 DIAGNOSIS — Z331 Pregnant state, incidental: Secondary | ICD-10-CM

## 2013-03-29 LAB — GLUCOSE, CAPILLARY: Glucose-Capillary: 87 mg/dL (ref 70–99)

## 2013-03-29 NOTE — Progress Notes (Signed)
3 HR GTT DONE TODAY Glenn Christo 

## 2013-03-30 LAB — GLUCOSE TOLERANCE, 3 HOURS
Glucose Tolerance, 1 hour: 179 mg/dL (ref 70–189)
Glucose Tolerance, 2 hour: 150 mg/dL (ref 70–164)
Glucose Tolerance, Fasting: 85 mg/dL (ref 70–104)
Glucose, GTT - 3 Hour: 106 mg/dL (ref 70–144)

## 2013-03-30 NOTE — Addendum Note (Signed)
Addended by: Jimmy Footman K on: 03/30/2013 10:47 AM   Modules accepted: Orders

## 2013-04-01 ENCOUNTER — Telehealth: Payer: Self-pay | Admitting: Family Medicine

## 2013-04-01 DIAGNOSIS — B373 Candidiasis of vulva and vagina: Secondary | ICD-10-CM

## 2013-04-01 NOTE — Telephone Encounter (Signed)
Please call Tonya Simmons to let her know that her tests from the vaginal swab last Friday were normal except for a yeast infection. We do not have to treat it unless she is having symptoms of a yeast infection like itching or discharge that bother her. If she would like treatment, please let me know.

## 2013-04-01 NOTE — Telephone Encounter (Signed)
Please also let the patient know that her thyroid hormone level was normal.

## 2013-04-02 NOTE — Telephone Encounter (Signed)
Pt started yesterday to have symptoms pt will like to treat yeast inf.  [place]

## 2013-04-05 ENCOUNTER — Other Ambulatory Visit: Payer: Self-pay | Admitting: Family Medicine

## 2013-04-05 ENCOUNTER — Encounter: Payer: Self-pay | Admitting: Family Medicine

## 2013-04-05 ENCOUNTER — Ambulatory Visit (HOSPITAL_COMMUNITY)
Admission: RE | Admit: 2013-04-05 | Discharge: 2013-04-05 | Disposition: A | Discharge status: Home / Self Care | Payer: No Typology Code available for payment source | Source: Ambulatory Visit | Attending: Family Medicine | Admitting: Family Medicine

## 2013-04-05 DIAGNOSIS — Z3689 Encounter for other specified antenatal screening: Secondary | ICD-10-CM | POA: Insufficient documentation

## 2013-04-05 DIAGNOSIS — Z3400 Encounter for supervision of normal first pregnancy, unspecified trimester: Secondary | ICD-10-CM

## 2013-04-26 ENCOUNTER — Encounter: Payer: Self-pay | Admitting: Family Medicine

## 2013-05-03 ENCOUNTER — Ambulatory Visit (INDEPENDENT_AMBULATORY_CARE_PROVIDER_SITE_OTHER): Payer: No Typology Code available for payment source | Admitting: Family Medicine

## 2013-05-03 ENCOUNTER — Encounter: Payer: Self-pay | Admitting: Family Medicine

## 2013-05-03 VITALS — BP 124/88 | Wt 243.0 lb

## 2013-05-03 DIAGNOSIS — Z3480 Encounter for supervision of other normal pregnancy, unspecified trimester: Secondary | ICD-10-CM

## 2013-05-03 DIAGNOSIS — Z23 Encounter for immunization: Secondary | ICD-10-CM

## 2013-05-03 DIAGNOSIS — Z3482 Encounter for supervision of other normal pregnancy, second trimester: Secondary | ICD-10-CM

## 2013-05-03 NOTE — Patient Instructions (Signed)
Fue un Arboriculturist. Segn la ltima ecografa, el beb es a las 21 semanas de desarrollo. Necesitamos otra ecografa para ver mejor la anatoma. Contine haciendo [employer].   Por favor, vuelva a la clnica prenatal en las prximas cuatro semanas y regresar a mi clnica despus.   Dr. Clinton Sawyer

## 2013-05-03 NOTE — Progress Notes (Signed)
32 year old G3P1011 @ [redacted]w[redacted]d by 2nd trimester u/s presenting for routine prenatal. No complaints  O: see flow sheet A/P:  - Patient needs f/u anatomy scan due to body habitus making it technically difficult at previous attempt on 04/05/13 - To receive flu today - Continue prenatal and encouraged exercise - F/u with pre-natal clinic with 4 weeks

## 2013-05-04 ENCOUNTER — Telehealth: Payer: Self-pay | Admitting: *Deleted

## 2013-05-04 DIAGNOSIS — Z3482 Encounter for supervision of other normal pregnancy, second trimester: Secondary | ICD-10-CM

## 2013-05-04 NOTE — Telephone Encounter (Signed)
Order corrected

## 2013-05-04 NOTE — Telephone Encounter (Signed)
Victorino Dike with [facility] radiology states that order for prenatal ultrasound placed yesterday was entered in wrong, needs to be re-entered using CPT code 19147. Will forward to PCP

## 2013-05-17 ENCOUNTER — Other Ambulatory Visit: Payer: Self-pay | Admitting: Family Medicine

## 2013-05-17 DIAGNOSIS — Z3482 Encounter for supervision of other normal pregnancy, second trimester: Secondary | ICD-10-CM

## 2013-06-03 ENCOUNTER — Ambulatory Visit (INDEPENDENT_AMBULATORY_CARE_PROVIDER_SITE_OTHER): Payer: No Typology Code available for payment source | Admitting: Family Medicine

## 2013-06-03 VITALS — BP 137/87 | Temp 98.0°F | Wt 245.0 lb

## 2013-06-03 DIAGNOSIS — R030 Elevated blood-pressure reading, without diagnosis of hypertension: Secondary | ICD-10-CM

## 2013-06-03 DIAGNOSIS — Z3480 Encounter for supervision of other normal pregnancy, unspecified trimester: Secondary | ICD-10-CM

## 2013-06-03 DIAGNOSIS — IMO0001 Reserved for inherently not codable concepts without codable children: Secondary | ICD-10-CM

## 2013-06-03 DIAGNOSIS — Z3482 Encounter for supervision of other normal pregnancy, second trimester: Secondary | ICD-10-CM

## 2013-06-03 DIAGNOSIS — R82998 Other abnormal findings in urine: Secondary | ICD-10-CM

## 2013-06-03 LAB — COMPLETE METABOLIC PANEL WITHOUT GFR
ALT: 11 U/L (ref 0–35)
AST: 12 U/L (ref 0–37)
Albumin: 3.5 g/dL (ref 3.5–5.2)
Alkaline Phosphatase: 51 U/L (ref 39–117)
BUN: 8 mg/dL (ref 6–23)
CO2: 20 meq/L (ref 19–32)
Calcium: 8.9 mg/dL (ref 8.4–10.5)
Chloride: 105 meq/L (ref 96–112)
Creat: 0.52 mg/dL (ref 0.50–1.10)
GFR, Est African American: >89 mL/min
GFR, Est Non African American: >89 mL/min
Glucose, Bld: 149 mg/dL — ABNORMAL HIGH (ref 70–99)
Potassium: 3.8 meq/L (ref 3.5–5.3)
Sodium: 137 meq/L (ref 135–145)
Total Bilirubin: 0.3 mg/dL (ref 0.3–1.2)
Total Protein: 6.5 g/dL (ref 6.0–8.3)

## 2013-06-03 LAB — GLUCOSE, CAPILLARY
Comment 1: 1
Glucose-Capillary: 147 mg/dL — ABNORMAL HIGH (ref 70–99)

## 2013-06-03 LAB — POCT URINALYSIS DIPSTICK
Bilirubin, UA: NEGATIVE
Glucose, UA: NEGATIVE
Ketones, UA: NEGATIVE
Nitrite, UA: NEGATIVE
Protein, UA: NEGATIVE
Spec Grav, UA: 1.02
Urobilinogen, UA: 0.2
pH, UA: 7

## 2013-06-03 LAB — POCT UA - MICROSCOPIC ONLY

## 2013-06-03 LAB — PROTIME-INR
INR: 1.03
Prothrombin Time: 13.5 s (ref 11.6–15.2)

## 2013-06-03 LAB — APTT: aPTT: 32 seconds (ref 24–37)

## 2013-06-03 NOTE — Patient Instructions (Signed)
Cuidados prenatales (Prenatal Care) QU SON LOS CUIDADOS PRENATALES?  Cuidados prenatales significan la asistencia de la salud antes de que nazca el beb. Cudese usted y tambin cuide al beb del siguiente modo:   Siga los cuidados prenatales desde comienzos del embarazo. Si sabe que est embarazada o piensa que podra estarlo, comunquese con su mdico lo antes posible. Arregle una visita para un examen general o prenatal.  Siga los cuidados prenatales de manera regular. Siga el esquema para realizar anlisis de sangre y otras pruebas necesarias y asista a todas las citas.  Si hace todo esto podr mantener su salud y la del beb durante todo el embarazo.  Los cuidados prenatales deben incluir una evaluacin de las necesidades mdicas, dietticas, educativas, psicolgicas y sociales para la pareja y la historia mdica, quirrgica y gentica de la familia de la madre y el padre.  Comente con su mdico:  Los medicamentos prescriptos, de venta libre y las hierbas medicinales que toma.  Si consume drogas, alcohol o fuma.  Si sufre violencia o abuso familiar.  Las vacunas que ha recibido.  Su nutricin y dieta.  La actividad fsica que practica.  Peligros ambientales y ocupacionales, del hogar y el trabajo.  Historia de enfermedades de transmisin sexual que hayan sufrido usted y su pareja.  Embarazos previos. PORQU LOS CUIDADOS PRENATALES SON TAN IMPORTANTES?  Si la controla con regularidad, el profesional tendr la oportunidad de encontrar problemas precozmente, de modo que puede tratarlos lo antes posible. Tambin puede llegar a evitar otros problemas. Muchos estudios han demostrado que una atencin prenatal temprana y regular es importante tanto para la salud de las madres como de sus bebs.  QUIERO QUEDAR EMBARAZADA COMO PUEDO CUIDARME?  El cuidado de la mujer antes de quedar embarazada la ayuda a tener un embarazo saludable y a disminuir las posibilidades de tener un beb con  problemas. Estas son las formas de cuidarse antes de quedan embarazada:   Consuma alimentos saludables, realice actividad fsica regularmente (lo ms aconsejable es 30 minutos por da, todos los das) y descanse y duerma lo suficiente. Converse con el profesional acerca de cules son los alimentos y ejercicios que ms le convienen.  Tome 400 microgramos (mcg) de cido flico (una de las vitaminas del grupo B) todos los das. Lo ms aconsejable es tomar un multivitamnico que contenga esta cantidad de cido flico. Si toma la cantidad suficiente de cido flico sinttico (manufacturado) todos los das, antes de quedar embarazada y a comienzos del embarazo, podr evitar que el beb sufra ciertos defectos. Hay cereales para el desayuno y otros productos que contienen granos a los que se les ha agregado cido flico, pero no todos contienen 400 mcg por porcin. Controle las etiquetas del multivitamnico o del cereal para conocer la cantidad de cido flico que contienen.  Verifique que cuenta con todas las vacunaciones, especialmente la de la rubola. La rubola puede causar graves defectos al beb. La varicela es otra enfermedad que debe evitarse durante el embarazo. Si ha sufrido varicela o rubola en el pasado, usted est inmunizada.  Informe al mdico acerca de todos los medicamentos prescriptos, de venta libre o hierbas que toma. Algunos medicamentos no son seguros para tomarlos durante el embarazo.  Deje de fumar, de beber alcohol o de tomar drogas. Pdale ayuda al mdico, si es necesario.  Comente y trate cualquier problema mdico, social o psicolgico antes de quedar embarazada.  Comente cualquier historia de problemas genticos en su familia o en la del padre. Siempre   que sea posible, haga pruebas genticas antes de quedar embarazada.  Comente a su mdico si es vctima de maltratos fsicos o emocionales.  Comente con el profesional si est expuesta a sustancias qumicas perjudiciales en su  lugar de trabajo o en el lugar en el que vive.  Comente con el profesional si considera que su trabajo o las horas que trabaja pueden ser perjudiciales y debera modificarlos.  El padre debe estar incluido en todas las tomas de decisiones con todos los aspectos del embarazo, el trabajo de parto y el parto.  Si tiene seguro mdico, asegrese que esta cubierta por embarazo. RECIN ME ENTERO QUE ESTOY EMBARAZADA COMO PUEDO CUIDARME?  Aqu hay algunas indicaciones para cuidarse usted misma y la preciosa nueva vida que crece en su interior.   Contine tomando el multivitamnico con 400 microgramos (mcg) de cido flico todos los das.  Siga los cuidados prenatales desde comienzos del embarazo. No importa si este es su primer embarazo o si ya tiene otros nios. Es importante que consulte con un profesional durante el embarazo. El mdico la controlar en cada visita para verificar que usted y el beb estn sanos. Si hubiera problemas, tomar medidas para ayudarla a usted o a su beb.  Consuma una dieta saludable que incluya.  Frutas  Vegetales  Alimentos que contengan pocas grasas saturadas.  Granos.  Alimentos ricos en calcio.  Beba entre 6 y 8 vasos de lquidos por da.  Excepto que el profesional le indique lo contrario, trate de mantenerse fsicamente activa durante 30 minutos, casi todos los das de la semana. Si no tiene tiempo, realice alguna actividad en segmentos de 10 minutos, tres veces por da.  Si fuma, bebe alcohol o consume drogas DEJE DE HACERLO. Estas sustancias pueden causar daos crnicos al beb. Converse con el profesional que la asiste con respecto a los pasos a seguir para dejar de fumar. Converse con algn miembro de la comunidad religiosa, terapeuta, un amigo de confianza o su mdico, si est preocupada con respecto a su consumo de alcohol o drogas.  Consulte con el profesional antes de tomar cualquier medicamento, an los de venta libre. Algunos medicamentos no son  seguros para tomarlos durante el embarazo.  Descanse y duerma lo suficiente.  Evite jacuzzis y saunas durante el embarazo  No pueden tomarle radiografas, excepto que sea absolutamente necesario y con autorizacin del mdico. Le colocarn una pantalla de plomo sobre el abdomen para proteger al beb cuando los rayos x ingresen a su organismo  No limpie el lecho del gato si est embarazada. Puede contener un parsito que causa una infeccin denominada toxoplasmosis, que ocasiona defectos en el beb. Tambin utilice guantes cuando trabaje en las zonas del jardn en las que se encuentra el gato.  No coma carne, pescado o queso crudo o mal cocido.  Permanezca alejada de sustancias txicas como:  Insecticidas.  Solventes (como algunos limpiadores o disolvente de pinturas).  Plomo.  Mercurio.  Puede tener relaciones sexuales hasta el final del embarazo excepto que sufra algn problema mdico o tenga alguna dificultad en el embarazo y su mdico le aconseje que no las tenga.  No use tacones altos, especialmente durante la segunda mitad del embarazo. Puede perder el equilibrio y caerse.  No haga viajes largos excepto que sea absolutamente necesario. Asegrese de hacer una visita al mdico antes de hacer el viaje.  No permanezca sentada en una posicin durante ms de 2 horas cuando viaje.  Lleve una copia de los registros mdicos cuando   vaya de viaje.  Averige dnde queda el hospital en la ciudad que visitar, en caso de emergencia.  Los productos de limpieza ms peligrosos tienen advertencias para la mujer embarazada en la etiqueta. Consulte con el profesional con respecto a los productos si no est segura.  Limite o elimine su consumo de cafena proveniente del caf, t, gaseosas, medicamentos y chocolate.  Muchas mujeres siguen trabajando durante el embarazo. Permanecer activa la ayuda a mantenerse sana. Si tiene preguntas relacionadas con la seguridad o las horas que trabaja,  consltelo con el profesional que la asiste.  Mantngase informada:  Lea libros.  Mire vdeos.  Concurra con el padre a las clases de preparto.  Converse con otras mams experimentadas.  Consulte con el profesional con respecto a las clases de preparto para usted y su compaero. Las clases la ayudarn a usted y su compaero a prepararse para el nacimiento de su beb.  Busque un pediatra (mdico de bebs) y consulte acerca de los mtodos para aliviar el dolor durante el trabajo de parto, el parto y la probabilidad de una cesrea. NO PIENSO QUEDAR EMBARAZADA TODAVA, PERO HE ESCUCHADO QUE TODAS LAS MUJERES DEBEN TOMAR CIDO FLICO TODOS LOS DAS.  Todas las mujeres en edad frtil, an aquellas que tengan una posibilidad remota de quedar embarazadas, deben tratar de tomar una cantidad suficiente de cido flico Muchos embarazos no son planeados. Muchas mujeres no saben que estn embarazadas y ciertos defectos congnitos se producen a comienzos del embarazo. Tomar 400 microgramos (mcg) de cido flico todos los das la ayudara a evitar ciertos defectos congnitos que se producen a comienzos del embarazo. Si una mujer comienza a tomar vitaminas en el segundo o tercer mes del embarazo, podra ser muy tarde para evitar algunos defectos congnitos. El cido flico puede tener otros efectos beneficiosos en la salud de la mujer, adems de prevenir defectos cngnitos.  CON QU FRECUENCIA DEBO VISITAR AL MDICO DURANTE EL EMBARAZO?  El profesional programar sus visitas prenatales. A medida que est ms cerca del final del embarazo, las visitas sern ms frecuentes. Un embarazo promedio dura alrededor de 40 semanas.  Una programacin de visitas tpica consiste en:   Aproximadamente una por mes durante los primeros seis meses de embarazo.  Cada dos semanas en los dos meses siguientes.  Una vez por semana en el ltimo mes, hasta la fecha de parto. El profesional querr verla ms a menudo.  Si tiene  mas de 35 aos de edad.  Su embarazo es de alto riesgo debido a que usted tiene ciertos problemas de salud o problemas con el embarazo como:  Diabetes.  Presin arterial elevada.  El beb no se desarrolla segn las pautas, de acuerdo con la fecha del embarazo. En estos casos la sometern a pruebas especiales para verificar que ni usted ni el beb tengan problemas graves. QU SUCEDE DURANTE LAS VISITAS PRENATALES?   En su primera visita, el profesional conversar con usted y su compaero acerca de la historia clnica de ambos y de sus respectivas familias, y le realizar un examen fsico.  En su primera visita, el examen mdico incluir controles de la presin arterial, el peso y la altura y un examen de los rganos plvicos. El mdico har un [place]apan[place] si no tiene uno reciente, y har cultivos del cuello del tero para descartar infecciones.  En cada visita deber realizar anlisis de sangre, orina, tomarse la presin arterial, controlar el peso y verificar el progreso del beb.  El profesional podr decirle cuando   es la fecha probable en la que el beb nacer.  En cada visita tambin tendr la posibilidad de aprender a mantenerse saludable durante el embarazo y podr hacer preguntas.  Comente con su mdico si est decidida a amamantar.  El profesional controlar como se desarrolla el beb. Le realizarn varios tipos de anlisis a medida que el embarazo progresa.  Le tomarn ecografas para controlar el desarrollo y la salud del beb.  Podrn solicitarle otros anlisis de sangre si es necesario. Podr incluir una amniocentesis (examen del lquido amnitico), pruebas de estrs (controla como responde el beb a las contracciones), el perfil biofsico (mediciones del feto). El profesional le explicar acerca de los anlisis y porqu son necesarios. ESTOY POR CUMPLIR 40 AOS Y QUIERO TENER UN HIJO DEBO TOMAR PRECAUCIONES ESPECIALES?  A medida que una mujer envejece, tiene ms  probabilidades de tener problemas mdicos (hipertensin arterial), problemas del embarazo (preeclampsia, problemas con la placenta), aborto espontneo o que el nio nazca con un defecto congnito. Sin embargo, la mayora de las mujeres a final de la dcada de los 30 aos o a comienzo de los 40, tienen bebs sanos. Consulte con el profesional de manera regular antes de quedar embarazada y realice todos los exmenes que se le solicitarn durante el embarazo. El profesional probablemente querr que se someta a algunas pruebas especiales para usted y su beb.  Actualmente las mujeres postergan la decisin de tener hijos hasta cuando tienen treinta o cuarenta aos. Aunque muchas mujeres de esta edad no tienen dificultad para quedar embarazadas, a medida que se envejece, la fertilidad declina. Las mujeres de ms de 40 aos que no pueden quedar embarazadas luego de intentarlo durante 6 meses, debern someterse a una evaluacin de su fertilidad. No es poco frecuente tener problemas para quedar embarazada o sufrir infertilidad (imposibilidad para quedar embarazada luego de tratar durante un ao). Si cree que usted o su compaero podran ser infrtiles, comntelo con el profesional que la asiste, quin puede recomendarle tratamientos con medicamentos, ciruga o tecnologa para la reproduccin asistida.  Document Released: 12/18/2007 Document Revised: 09/23/2011 [employer] Patient Information 2014 [employer].  

## 2013-06-03 NOTE — Progress Notes (Signed)
S: This is a 32 y.o G3P011 @[redacted]w[redacted]d  by 2nd trimester ultrasound who presents for routing prenatal care. Of note, this pregnancy is complicated by obesity.  Of note, her blood pressure at today's visit was noted to be 137/87 but on repeat BP was noted to be 132/79. She denies headaches, blurry vision, edema, nausea or vomiting. She does not have any other complaints at this time.   O:   Vital  BP 132/79   Temp 98 Cardio: Normal S1 S2 Pulm: Normal work of breathing. Normal airflow bilaterally Abdomen: No tenderness to palpation Fetal Wellbeing: Limited due to large body habitus Bedside Ultrasound: Fetal heart beat visualized.   Plan:  -Given elevated BP, we will order pre-eclampsia labs ( CMP, PT and  PTT) -Will return in 2 weeks. F/u with lab results

## 2013-06-04 LAB — CULTURE, OB URINE
Colony Count: NO GROWTH
Organism ID, Bacteria: NO GROWTH

## 2013-06-04 NOTE — Progress Notes (Signed)
32 Y/O G3P1011 @ [redacted]w[redacted]d GA here for routine prenatal check up,she denies any discomfort, no vaginal discharge or bleeding,denies any urinary symptoms,appetite is good, takes her prenatal vitamins regularly. Denies headache,no N/V,no leg swelling, no hx of HTN prior to pregnancy but inher previous pregnancy had elevation of her BP. Feels well otherwise.  Filed Vitals:   06/03/13 1119  BP: 137/87  Temp: 98 F (36.7 C)  Weight: 245 lb (111.131 kg)   BP rechecked multiple times raging from 135/80-132/79  Exam: Check flow sheet. Resp/CV: wnl. Abd: Benign, FH not heard with doppler U/S. Fetal heart movement seen with bedside U/S. Ext: No edema.  A/P:  Routine prenatal care in obese mother         BP elevated with no symptoms of pre-eclampsia.         HELLP work up with CM[facility], Coagulation profile and UA checked today         UA negative for protein but positive with large leukocyte. Patient has no urinary symptoms.         Urine sent for culture, if positive I will treat her for asymptomatic bacteruria in a pregnant woman.         Her OB/ U/S reviewed, there is need for repeat U/S which was discussed with patient.         Her U/S is scheduled for Nov 25th.         Flu shot given today.         1hr Glucola test done today.         F/U in 2 wks for BP reassessment. Patient advised to go to [facility] hospital if having headache,N/V or not feeling well.         Preterm labor precaution discussed.

## 2013-06-07 ENCOUNTER — Other Ambulatory Visit (INDEPENDENT_AMBULATORY_CARE_PROVIDER_SITE_OTHER): Payer: No Typology Code available for payment source

## 2013-06-07 DIAGNOSIS — Z331 Pregnant state, incidental: Secondary | ICD-10-CM

## 2013-06-07 LAB — GLUCOSE, CAPILLARY: Glucose-Capillary: 88 mg/dL (ref 70–99)

## 2013-06-07 NOTE — Progress Notes (Signed)
3 HR GTT DONE TODAY Tonya Simmons 

## 2013-06-08 ENCOUNTER — Ambulatory Visit (HOSPITAL_COMMUNITY)
Admission: RE | Admit: 2013-06-08 | Discharge: 2013-06-08 | Disposition: A | Discharge status: Home / Self Care | Payer: No Typology Code available for payment source | Source: Ambulatory Visit | Attending: Family Medicine | Admitting: Family Medicine

## 2013-06-08 ENCOUNTER — Other Ambulatory Visit: Payer: Self-pay | Admitting: Family Medicine

## 2013-06-08 DIAGNOSIS — Z3482 Encounter for supervision of other normal pregnancy, second trimester: Secondary | ICD-10-CM

## 2013-06-08 DIAGNOSIS — Z3689 Encounter for other specified antenatal screening: Secondary | ICD-10-CM | POA: Insufficient documentation

## 2013-06-08 DIAGNOSIS — E669 Obesity, unspecified: Secondary | ICD-10-CM | POA: Insufficient documentation

## 2013-06-08 LAB — GLUCOSE TOLERANCE, 3 HOURS
Glucose Tolerance, 1 hour: 167 mg/dL (ref 70–189)
Glucose Tolerance, 2 hour: 136 mg/dL (ref 70–164)
Glucose Tolerance, Fasting: 83 mg/dL (ref 70–104)
Glucose, GTT - 3 Hour: 94 mg/dL (ref 70–144)

## 2013-06-09 ENCOUNTER — Encounter: Payer: Self-pay | Admitting: Family Medicine

## 2013-06-17 ENCOUNTER — Ambulatory Visit (INDEPENDENT_AMBULATORY_CARE_PROVIDER_SITE_OTHER): Payer: No Typology Code available for payment source | Admitting: Family Medicine

## 2013-06-17 VITALS — BP 126/86 | Wt 248.6 lb

## 2013-06-17 DIAGNOSIS — Z23 Encounter for immunization: Secondary | ICD-10-CM

## 2013-06-17 DIAGNOSIS — Z3480 Encounter for supervision of other normal pregnancy, unspecified trimester: Secondary | ICD-10-CM

## 2013-06-17 DIAGNOSIS — Z3483 Encounter for supervision of other normal pregnancy, third trimester: Secondary | ICD-10-CM

## 2013-06-17 LAB — CBC
HCT: 35.2 % — ABNORMAL LOW (ref 36.0–46.0)
Hemoglobin: 12.1 g/dL (ref 12.0–15.0)
MCH: 29.7 pg (ref 26.0–34.0)
MCHC: 34.4 g/dL (ref 30.0–36.0)
MCV: 86.5 fL (ref 78.0–100.0)
Platelets: 175 10*3/uL (ref 150–400)
RBC: 4.07 MIL/uL (ref 3.87–5.11)
RDW: 13.2 % (ref 11.5–15.5)
WBC: 7.5 10*3/uL (ref 4.0–10.5)

## 2013-06-17 LAB — RPR

## 2013-06-17 LAB — HIV ANTIBODY (ROUTINE TESTING W REFLEX): HIV: NONREACTIVE

## 2013-06-17 NOTE — Progress Notes (Signed)
Tonya Simmons is a 32 y.o. G3P1011 at [redacted]w[redacted]d for routine follow up.  She reports no complaints. She denies loss of fluids, vaginal bleeding, or contracts. Reports good fetal movement.   Gen: Obese Female in NAD CV: RRR, No m/r/g; No LE Edema Lungs: CTAB; Normal Resp. Effort See flow sheet for details. Beside US performed: Good Fetal heart movement noted  A/P: Pregnancy at [redacted]w[redacted]d.  Doing well.   Pregnancy issues include: None  Infant feeding choice considering breast feeding supplemented with formula Contraception choice: She is interested in nexplanon vs IUD. Would be interested in Depo shot prior to leaving hospital  Tdapwas given today. 3 hour glucola results were discussed , CBC, RPR, and HIV were done today.   RH status was reviewed and pt does not need Rhogam.  Rhogam was not given today.   Childbirth and education classes were offered, and patient declined at this time. Preterm labor precautions reviewed. Kick counts reviewed. Follow up 2 weeks with PCP

## 2013-06-17 NOTE — Progress Notes (Addendum)
FM OB Note: Tonya Pagan, MD I  have seen and examined this patient, reviewed their chart. I have discussed this patient with the resident. I agree with the resident's findings, assessment and care plan.  32 y/O F G3P1011 at 18 w6d for routine prenatal follow up as well as BP check, during last visit her BP was elevated,no major complaints today.  Exam: Check flow sheet. Bedside U/S also done and fetal heart beat was visible.  A/P: Normal progression of pregnancy. BP improved today.         Lab and U/S reviewed.         She failed last 1 hr Glucola testing but passed her 3 hr Glucola.         Anatomic scan need repeat in 4 wks, to schedule at next visit with her PCP.         Tdap was given today,she is up to date with her flu shot.         28 wk lab drawn today.         F/U in 2 wks with PCP or earlier if any concern.

## 2013-06-17 NOTE — Patient Instructions (Signed)
Please schedule next appointment in 2 weeks with Dr Clinton Sawyer

## 2013-06-29 ENCOUNTER — Ambulatory Visit (INDEPENDENT_AMBULATORY_CARE_PROVIDER_SITE_OTHER): Payer: No Typology Code available for payment source | Admitting: Family Medicine

## 2013-06-29 VITALS — BP 122/82 | Temp 98.4°F | Wt 246.0 lb

## 2013-06-29 DIAGNOSIS — Z3403 Encounter for supervision of normal first pregnancy, third trimester: Secondary | ICD-10-CM

## 2013-06-29 DIAGNOSIS — Z3480 Encounter for supervision of other normal pregnancy, unspecified trimester: Secondary | ICD-10-CM

## 2013-06-29 DIAGNOSIS — Z3483 Encounter for supervision of other normal pregnancy, third trimester: Secondary | ICD-10-CM

## 2013-06-29 DIAGNOSIS — Z3400 Encounter for supervision of normal first pregnancy, unspecified trimester: Secondary | ICD-10-CM

## 2013-06-29 NOTE — Patient Instructions (Signed)
Fue Psychiatrist ver yuo hoy Tonya Simmons. Su hija es muy saludable. Por favor regrese a la clnica en 3 semanas para una visita conmigo. Ven antes si tiene algn problema.  [name]!  Dr. Clinton Sawyer

## 2013-06-29 NOTE — Progress Notes (Signed)
32 year old F G3P1011 @ 294d by ultrasound presenting for routine prenatal care. Pregnancy complicated by obesity. Elevated 1 hr glucose but normal 3 hour.  No complications. Pt denies headaches, changes in vision, abdominal pain.   O: initial BP 138/84, but rechecked twice and 122/82 on left and 122/78 on left.  See flow sheet for rest  A/P: Pregnancy at [redacted]w[redacted]d. Pregnancy issues include: None   Infant feeding choice considering breast feeding supplemented with formula  Contraception choice: She is interested in nexplanon vs IUD. Would be interested in Depo shot prior to leaving hospital   Tdapwas given 08/18/12.  CBC, RPR, and HIV were done 06/17/13.   Preterm labor precautions reviewed.  Kick counts reviewed.   Pt to have repeat ultrasound since anatomy difficult to see at last u/s at [redacted]w[redacted]d.  Follow up 2 weeks with PCP

## 2013-07-01 ENCOUNTER — Ambulatory Visit (HOSPITAL_COMMUNITY)
Admission: RE | Admit: 2013-07-01 | Discharge: 2013-07-01 | Disposition: A | Discharge status: Home / Self Care | Payer: No Typology Code available for payment source | Source: Ambulatory Visit | Attending: Family Medicine | Admitting: Family Medicine

## 2013-07-01 DIAGNOSIS — E669 Obesity, unspecified: Secondary | ICD-10-CM | POA: Insufficient documentation

## 2013-07-01 DIAGNOSIS — Z3403 Encounter for supervision of normal first pregnancy, third trimester: Secondary | ICD-10-CM

## 2013-07-01 DIAGNOSIS — Z3689 Encounter for other specified antenatal screening: Secondary | ICD-10-CM | POA: Insufficient documentation

## 2013-07-15 NOTE — L&D Delivery Note (Signed)
Delivery Note At 9:10 AM a viable female was delivered via Vaginal, Spontaneous Delivery (Presentation: OA).  APGAR: 8, 9; weight .   Placenta status: Intact, Spontaneous.  Cord: 3 vessels with the following complications: None.   Anesthesia: Local  Episiotomy: None Lacerations: 2nd degree;Perineal Suture Repair: 3.0 vicryl Est. Blood Loss (mL): 250  Mom to postpartum.  Baby to Couplet care / Skin to Skin.  Mat CarneWILLIAMSON, Krosby Ritchie 09/03/2013, 9:55 AM

## 2013-07-15 NOTE — L&D Delivery Note (Signed)
I have seen the patient with the resident/student and agree with the above.  Tenaya Hilyer Donovan  

## 2013-07-21 ENCOUNTER — Ambulatory Visit (INDEPENDENT_AMBULATORY_CARE_PROVIDER_SITE_OTHER): Payer: No Typology Code available for payment source | Admitting: Family Medicine

## 2013-07-21 VITALS — BP 128/86 | Temp 98.5°F | Wt 249.0 lb

## 2013-07-21 DIAGNOSIS — Z3480 Encounter for supervision of other normal pregnancy, unspecified trimester: Secondary | ICD-10-CM

## 2013-07-21 DIAGNOSIS — Z3483 Encounter for supervision of other normal pregnancy, third trimester: Secondary | ICD-10-CM

## 2013-07-21 NOTE — Patient Instructions (Signed)
Happy New Year! I am very happy that you and the baby are donig well. Please come back in 2 weeks. Schedule and appointment for Friday January 23rd in the morning.  I am not officially in clinic, but I can see you at that time. Please schedule for 8:30 AM.   Sincerely,   Dr. Albertine PatriciaWilliamson  Feliz Ao PalominasNuevo! Estoy muy feliz de que usted y el beb estn bien donig. Por favor, vuelve en 2 semanas. Horario y cita para el viernes 23 de enero por la [place].  Atentamente,  Dr. Clinton SawyerWilliamson

## 2013-07-21 NOTE — Progress Notes (Signed)
[name] is a 33 y.o. G3P1001 at 10551w5d for routine follow up.  She reports no pain or contractions.  See flow sheet for details.  A/P: Pregnancy at 6551w5d.  Doing well.   Pregnancy issues include obesity   Infant feeding choice breast + formula.  Contraception choice IUD + depo before leaving hospital   Tdapwas given 12/4. GBS/GC/CT testing performed at 36 weeks.  Preterm labor precautions reviewed. Safe sleep discussed. Kick counts reviewed. Tour/Parenting classes offered.  Follow up 2 weeks.

## 2013-08-04 ENCOUNTER — Ambulatory Visit (INDEPENDENT_AMBULATORY_CARE_PROVIDER_SITE_OTHER): Payer: No Typology Code available for payment source | Admitting: Family Medicine

## 2013-08-04 VITALS — BP 143/97 | Temp 98.1°F | Wt 249.0 lb

## 2013-08-04 DIAGNOSIS — O133 Gestational [pregnancy-induced] hypertension without significant proteinuria, third trimester: Secondary | ICD-10-CM

## 2013-08-04 DIAGNOSIS — Z3480 Encounter for supervision of other normal pregnancy, unspecified trimester: Secondary | ICD-10-CM

## 2013-08-04 DIAGNOSIS — O139 Gestational [pregnancy-induced] hypertension without significant proteinuria, unspecified trimester: Secondary | ICD-10-CM

## 2013-08-04 LAB — COMPREHENSIVE METABOLIC PANEL
ALT: 10 U/L (ref 0–35)
AST: 13 U/L (ref 0–37)
Albumin: 3.5 g/dL (ref 3.5–5.2)
Alkaline Phosphatase: 85 U/L (ref 39–117)
BUN: 7 mg/dL (ref 6–23)
CO2: 20 mEq/L (ref 19–32)
Calcium: 8.9 mg/dL (ref 8.4–10.5)
Chloride: 105 mEq/L (ref 96–112)
Creat: 0.56 mg/dL (ref 0.50–1.10)
Glucose, Bld: 77 mg/dL (ref 70–99)
Potassium: 3.7 mEq/L (ref 3.5–5.3)
Sodium: 135 mEq/L (ref 135–145)
Total Bilirubin: 0.3 mg/dL (ref 0.3–1.2)
Total Protein: 6.5 g/dL (ref 6.0–8.3)

## 2013-08-04 LAB — CBC
HCT: 34.1 % — ABNORMAL LOW (ref 36.0–46.0)
Hemoglobin: 11.8 g/dL — ABNORMAL LOW (ref 12.0–15.0)
MCH: 29.8 pg (ref 26.0–34.0)
MCHC: 34.6 g/dL (ref 30.0–36.0)
MCV: 86.1 fL (ref 78.0–100.0)
Platelets: 183 10*3/uL (ref 150–400)
RBC: 3.96 MIL/uL (ref 3.87–5.11)
RDW: 13.4 % (ref 11.5–15.5)
WBC: 9 10*3/uL (ref 4.0–10.5)

## 2013-08-04 NOTE — Progress Notes (Signed)
33 y.o G3P1011 presenting for routine prenatal care. BP is elevated today. She denies vision changes, headache, SOB, RUQ pain, and edema. She has no hx of PIH or pre-eclampsia.   O: see flow sheet for vitals, recheck 132/91 Gen: morbidly obese female, non distressed, very pleasant HEENT: NCAT, PERRLA, EOMI CV: RRR, no murmurs Pulm: normal WOB, CTA-B Abd: obese, gravid, non tender to palpation in RUQ Neuro: no hyperreflexia noted Extremities: no edema of LE bilaterally  A/P: Pt meets criteria for PIH - Obtain CBC, CMP, urine prot/creat ratio; if at all abnormal, then will contact pt and instruct her to go to [facility]; otherwise refer to High Risk OB clinic - Anatomy U/S x 3 did not demonstrate abnormality, but were incomplete due to body habitus; last one at 1452w6d and recommended another ultrasound in 4-6 weeks for complete visualization of fetus  Infant feeding choice breast + formula.  Contraception choice IUD + depo before leaving hospital   Tdapwas given 12/4.   GBS/GC/CT testing performed at 36 weeks.  Preterm labor precautions reviewed.

## 2013-08-04 NOTE — Assessment & Plan Note (Signed)
A: started 3rd trimester with borderline elevated BP earlier in pregnancy P: PIH labs and referral to high risk, on therapy started today

## 2013-08-05 ENCOUNTER — Telehealth: Payer: Self-pay | Admitting: *Deleted

## 2013-08-05 DIAGNOSIS — O1490 Unspecified pre-eclampsia, unspecified trimester: Secondary | ICD-10-CM

## 2013-08-05 DIAGNOSIS — O163 Unspecified maternal hypertension, third trimester: Secondary | ICD-10-CM

## 2013-08-05 LAB — OTHER SOLSTAS TEST
Creatinine, Urine: 38.1 mg/dL
Protein/Creat Ratio: 444 — ABNORMAL HIGH (ref 0–125)
Total Protein, Urine: 15 mg/dL

## 2013-08-05 NOTE — Telephone Encounter (Signed)
Spoke with [facility].  [facility] [employer] [facility] and will call patient with appt.  Takeem Krotzer, Darlyne RussianKristen L, CMA

## 2013-08-05 NOTE — Telephone Encounter (Signed)
The patient urine/creat ratio came back elevated. I spoke with the attending OB-GYN, Dr. Elsie LincolnKelly Leggett, who instructed that the patient needs another blood pressure check today and that we need to start a 24 urine collection for protein measurement. Additionally, the patient needs a BPP and growth ultrasound. She will need an appointment at high risk clinic on Monday. I contact high risk clinic and she now has an appointment at [facility] on 1/26. I called the patient, but she did not answer. I left a message explaining the need for her to call me back this afternoon and to come to the clinic for more testing.

## 2013-08-05 NOTE — Telephone Encounter (Signed)
Pt contacted and informed of protein in the urine. She notes that she is not having any symptoms at this time. She was instructed to come to our clinic at 8:30 in the morning for a blood pressure check and to start urine collection for 24 hours. [facility] any [facility]. The patient voiced understanding.

## 2013-08-06 ENCOUNTER — Ambulatory Visit (INDEPENDENT_AMBULATORY_CARE_PROVIDER_SITE_OTHER): Payer: No Typology Code available for payment source | Admitting: Family Medicine

## 2013-08-06 ENCOUNTER — Ambulatory Visit (INDEPENDENT_AMBULATORY_CARE_PROVIDER_SITE_OTHER): Payer: No Typology Code available for payment source | Admitting: *Deleted

## 2013-08-06 VITALS — BP 134/90 | Wt 250.0 lb

## 2013-08-06 DIAGNOSIS — IMO0002 Reserved for concepts with insufficient information to code with codable children: Secondary | ICD-10-CM

## 2013-08-06 DIAGNOSIS — O169 Unspecified maternal hypertension, unspecified trimester: Secondary | ICD-10-CM

## 2013-08-06 DIAGNOSIS — O1403 Mild to moderate pre-eclampsia, third trimester: Secondary | ICD-10-CM

## 2013-08-06 DIAGNOSIS — O163 Unspecified maternal hypertension, third trimester: Secondary | ICD-10-CM

## 2013-08-06 NOTE — Progress Notes (Signed)
NST reactive 08/06/13

## 2013-08-06 NOTE — Progress Notes (Signed)
33 year old G3P1011 @ 6020w0d by 2nd tri u/s who presents with mild-pre-eclampsia diagnosed 2 days ago with elevated BP to 143/97 and protein/creatinine ratio of 0.34. Today she reports no problems and denies headache, changes in vision, RUQ pain, shortness of breath and swelling in her legs. The baby is moving frequently and no contractions are present.   Gen: morbidly obese female, non distressed, very pleasant  HEENT: NCAT, PERRLA, EOMI  CV: RRR, no murmurs  Pulm: normal WOB, CTA-B  Abd: obese, gravid, non tender to palpation in RUQ  Neuro: no hyperreflexia noted  Extremities: no edema of LE bilaterally  A/P: Mild pre-eclampsia in 3rd trimester.  - Pt give container for 24 urine collection and order placed for protein measurement - Pt to go to high risk clinic this AM at 11 for further evaluation

## 2013-08-10 ENCOUNTER — Ambulatory Visit (INDEPENDENT_AMBULATORY_CARE_PROVIDER_SITE_OTHER): Payer: No Typology Code available for payment source | Admitting: *Deleted

## 2013-08-10 VITALS — BP 118/78

## 2013-08-10 DIAGNOSIS — IMO0002 Reserved for concepts with insufficient information to code with codable children: Secondary | ICD-10-CM

## 2013-08-10 NOTE — Progress Notes (Signed)
NST performed today was reviewed and was found to be reactive.  Continue recommended antenatal testing and prenatal care.  

## 2013-08-10 NOTE — Progress Notes (Signed)
P = 82   Interpreter - Marlynn PerkingMaria Simmons.  Pt is collecting 24 hr urine today and [facility].  Order added for creatinine clearance per protocol. Continue twice weekly testing- Ob [place] for growth scheduled on 1/30.  Next prenatal visit w/Dr. Clinton SawyerWilliamson scheduled on 2/4 @ 1530 - pt requests Spanish interpreter.

## 2013-08-11 NOTE — Addendum Note (Signed)
Addended by: Herminio HeadsHOLDER, Bradford Cazier on: 08/11/2013 02:13 PM   Modules accepted: Orders

## 2013-08-12 LAB — PROTEIN, URINE, 24 HOUR
Protein, 24H Urine: 224 mg/d — ABNORMAL HIGH (ref 50–100)
Protein, Urine: 7 mg/dL

## 2013-08-13 ENCOUNTER — Ambulatory Visit (INDEPENDENT_AMBULATORY_CARE_PROVIDER_SITE_OTHER): Payer: No Typology Code available for payment source | Admitting: *Deleted

## 2013-08-13 ENCOUNTER — Ambulatory Visit (HOSPITAL_COMMUNITY)
Admission: RE | Admit: 2013-08-13 | Discharge: 2013-08-13 | Disposition: A | Discharge status: Home / Self Care | Payer: No Typology Code available for payment source | Source: Ambulatory Visit | Attending: Family Medicine | Admitting: Family Medicine

## 2013-08-13 VITALS — BP 120/86

## 2013-08-13 DIAGNOSIS — Z3689 Encounter for other specified antenatal screening: Secondary | ICD-10-CM | POA: Insufficient documentation

## 2013-08-13 DIAGNOSIS — E669 Obesity, unspecified: Secondary | ICD-10-CM | POA: Insufficient documentation

## 2013-08-13 DIAGNOSIS — O99210 Obesity complicating pregnancy, unspecified trimester: Principal | ICD-10-CM

## 2013-08-13 DIAGNOSIS — O1490 Unspecified pre-eclampsia, unspecified trimester: Secondary | ICD-10-CM

## 2013-08-13 DIAGNOSIS — IMO0002 Reserved for concepts with insufficient information to code with codable children: Secondary | ICD-10-CM

## 2013-08-13 NOTE — Progress Notes (Signed)
P = 94   US for growth done today.

## 2013-08-17 ENCOUNTER — Ambulatory Visit (INDEPENDENT_AMBULATORY_CARE_PROVIDER_SITE_OTHER): Payer: No Typology Code available for payment source | Admitting: *Deleted

## 2013-08-17 ENCOUNTER — Ambulatory Visit: Payer: No Typology Code available for payment source

## 2013-08-17 VITALS — BP 113/82

## 2013-08-17 DIAGNOSIS — IMO0002 Reserved for concepts with insufficient information to code with codable children: Secondary | ICD-10-CM

## 2013-08-17 NOTE — Progress Notes (Signed)
NST performed today was reviewed and was found to be reactive.  Continue recommended antenatal testing and prenatal care.  

## 2013-08-17 NOTE — Progress Notes (Signed)
P = 92     Pt has Ob fu appt w/Dr. Clinton SawyerWilliamson tomorrow.  US for growth done 1/30 - EFW = 3385, >90%, AFI = 14.95cm.

## 2013-08-18 ENCOUNTER — Other Ambulatory Visit (HOSPITAL_COMMUNITY)
Admission: RE | Admit: 2013-08-18 | Discharge: 2013-08-18 | Disposition: A | Discharge status: Home / Self Care | Payer: No Typology Code available for payment source | Source: Ambulatory Visit | Attending: Family Medicine | Admitting: Family Medicine

## 2013-08-18 ENCOUNTER — Ambulatory Visit (INDEPENDENT_AMBULATORY_CARE_PROVIDER_SITE_OTHER): Payer: No Typology Code available for payment source | Admitting: Family Medicine

## 2013-08-18 VITALS — BP 120/85 | Temp 97.5°F | Wt 252.0 lb

## 2013-08-18 DIAGNOSIS — O0990 Supervision of high risk pregnancy, unspecified, unspecified trimester: Secondary | ICD-10-CM

## 2013-08-18 DIAGNOSIS — N898 Other specified noninflammatory disorders of vagina: Secondary | ICD-10-CM

## 2013-08-18 DIAGNOSIS — Z113 Encounter for screening for infections with a predominantly sexual mode of transmission: Secondary | ICD-10-CM | POA: Insufficient documentation

## 2013-08-18 LAB — POCT WET PREP (WET MOUNT): Clue Cells Wet Prep Whiff POC: NEGATIVE

## 2013-08-18 NOTE — Patient Instructions (Signed)
Usted y el beb parece muy sana hoy. Usted presin es Winn-Dixiemucho mejor. Voy a Nurse, mental healthhacerle saber los [employer] Sher[employer]Williamsresultados de las pruebas que hicimos hoy. Por favor, vuelve en 1 semana.  Atentamente,  Dr. Girard CooterWilliamson  You and the baby look very healthy today. You pressure is much better. I will let you know the results of the tests that we did today. Please come back in 1 week.   Sincerely,   Dr. Clinton SawyerWilliamson

## 2013-08-18 NOTE — Progress Notes (Signed)
33 year old G3P1011 @ 3728w5d EGA who presents for routine follow up. Pt diagnosed with pregnancy induced HTN @ 7943w5d and work up for pre-eclampsia showed mildly elevated protein:creatinine ratio, although the 24 urine protein was normal. She has had 3 NST's that are all normal. Today she reports no symptoms of headache, blurred vision, shortness of breath, RUQ pain, or edema. She does have infrequent light contractions.   Gen: morbidly obese female, non distressed, very pleasant  HEENT: NCAT, PERRLA, EOMI  CV: RRR, no murmurs  Pulm: normal WOB, CTA-B  Abd: obese, gravid, non tender to palpation in RUQ  Neuro: no hyperreflexia noted  Extremities: no edema of LE bilaterally  A/P:  - PIH/Mild Pre-eclampsia: BP well controlled today without meds, no symptoms so will not repeat PIH labs; pt has NST/BPP scheduled for Friday - 36 week care: obtain GBS, GC, chlamydia today - F/u 1 week    Interpreter present.

## 2013-08-18 NOTE — Assessment & Plan Note (Signed)
Cont current plan of care with regular NST given PIH; F/u 1 week

## 2013-08-20 ENCOUNTER — Ambulatory Visit (INDEPENDENT_AMBULATORY_CARE_PROVIDER_SITE_OTHER): Payer: No Typology Code available for payment source | Admitting: *Deleted

## 2013-08-20 ENCOUNTER — Encounter: Payer: Self-pay | Admitting: Family Medicine

## 2013-08-20 VITALS — BP 118/75

## 2013-08-20 DIAGNOSIS — IMO0002 Reserved for concepts with insufficient information to code with codable children: Secondary | ICD-10-CM

## 2013-08-20 LAB — STREP B DNA PROBE: GBSP: NEGATIVE

## 2013-08-20 NOTE — Progress Notes (Signed)
P-78 

## 2013-08-20 NOTE — Progress Notes (Signed)
NST reviewed and category I 

## 2013-08-24 ENCOUNTER — Ambulatory Visit (INDEPENDENT_AMBULATORY_CARE_PROVIDER_SITE_OTHER): Payer: No Typology Code available for payment source | Admitting: *Deleted

## 2013-08-24 VITALS — BP 112/80

## 2013-08-24 DIAGNOSIS — IMO0002 Reserved for concepts with insufficient information to code with codable children: Secondary | ICD-10-CM

## 2013-08-24 NOTE — Progress Notes (Signed)
NST on 08/13/13 reactive

## 2013-08-24 NOTE — Progress Notes (Signed)
P = 87 

## 2013-08-25 ENCOUNTER — Ambulatory Visit (INDEPENDENT_AMBULATORY_CARE_PROVIDER_SITE_OTHER): Payer: No Typology Code available for payment source | Admitting: Family Medicine

## 2013-08-25 VITALS — BP 126/88 | Temp 97.8°F | Wt 251.1 lb

## 2013-08-25 DIAGNOSIS — O139 Gestational [pregnancy-induced] hypertension without significant proteinuria, unspecified trimester: Secondary | ICD-10-CM

## 2013-08-25 DIAGNOSIS — B373 Candidiasis of vulva and vagina: Secondary | ICD-10-CM

## 2013-08-25 DIAGNOSIS — B3731 Acute candidiasis of vulva and vagina: Secondary | ICD-10-CM

## 2013-08-25 NOTE — Patient Instructions (Signed)
Todo se ve bien. Usted puede utilizar el gel para la infeccin por cndida. Esto debera ayudar al ardor vaginal. Favor de traer de vuelta la orina en 24 horas.  Regresa la prxima semana.  Atentamente,  Dr. Clinton SawyerWilliamson

## 2013-08-25 NOTE — Progress Notes (Signed)
33 year old G3P1011 @ 7583w5d EGA who presents for routine follow up. Since last visit patient has had a normal NST on 08/20/13. There was previous concern for mild-preelampsia, but the patient did not have a creatinine clearance measured with her 24 hour urine protein. Today pt denies chest pain, SOB, blurred vision, RUQ pain, and swelling in her extremities. Pt is reporting short contractions irregularly spaced throughout the day and also have vaginal burning.   Gen: morbidly obese female, non distressed, very pleasant  HEENT: NCAT, PERRLA, EOMI  CV: RRR, no murmurs  Pulm: normal WOB, CTA-B  Abd: obese, gravid, non tender to palpation in RUQ  Neuro: no hyperreflexia noted  Extremities: no edema of LE bilaterally Cervix: LTC  A/P: - Obtain 24 hour urine protein and urine creatinine today  - [facility]  - Labs GBS, CT/GC negative - Pos vag candidiasis to be treated with clotrimazole - Planning for SVD at term - RTC in 1 week

## 2013-08-26 MED ORDER — CLOTRIMAZOLE 1 % VA CREA: 1.0000 | TOPICAL_CREAM | Freq: Every day | VAGINAL | Status: DC

## 2013-08-26 NOTE — Addendum Note (Signed)
Addended by: Herminio HeadsHOLDER, Talayia Hjort on: 08/26/2013 04:15 PM   Modules accepted: Orders

## 2013-08-27 ENCOUNTER — Ambulatory Visit (INDEPENDENT_AMBULATORY_CARE_PROVIDER_SITE_OTHER): Payer: No Typology Code available for payment source | Admitting: *Deleted

## 2013-08-27 VITALS — BP 118/83

## 2013-08-27 DIAGNOSIS — IMO0002 Reserved for concepts with insufficient information to code with codable children: Secondary | ICD-10-CM

## 2013-08-27 LAB — PROTEIN, URINE, 24 HOUR
Protein, 24H Urine: 240 mg/d — ABNORMAL HIGH (ref 50–100)
Protein, Urine: 12 mg/dL

## 2013-08-27 LAB — CREATININE, URINE, 24 HOUR
Creatinine, 24H Ur: 1454 mg/d (ref 700–1800)
Creatinine, Urine: 72.7 mg/dL

## 2013-08-27 NOTE — Progress Notes (Signed)
P = 83    [facility] - awaiting results to determine if she has pre-eclampsia.   Pt denies all sx - Interpreter Nettie ElmSylvia present for visit today.

## 2013-08-27 NOTE — Progress Notes (Signed)
NST performed today was reviewed and was found to be reactive.  Continue recommended antenatal testing and prenatal care.  

## 2013-08-31 ENCOUNTER — Other Ambulatory Visit: Payer: No Typology Code available for payment source

## 2013-08-31 NOTE — Progress Notes (Signed)
NST reactive on 08/24/13

## 2013-09-01 ENCOUNTER — Encounter: Payer: Self-pay | Admitting: Family Medicine

## 2013-09-02 ENCOUNTER — Telehealth: Payer: Self-pay | Admitting: *Deleted

## 2013-09-02 NOTE — Telephone Encounter (Signed)
Called pt with interpreter - Tonya Simmons([name]) and left message stating that she does not need appt for NST tomorrow @ 0800. (pt does not have pre-eclampsia)  [facility] since she missed the appt yesterday due to the snow. She may call back if she has questions.

## 2013-09-03 ENCOUNTER — Inpatient Hospital Stay (HOSPITAL_COMMUNITY)
Admission: AD | Admit: 2013-09-03 | Discharge: 2013-09-04 | DRG: 775 | Disposition: A | Discharge status: Home / Self Care | Payer: Medicaid Other | Source: Ambulatory Visit | Attending: Family Medicine | Admitting: Family Medicine

## 2013-09-03 ENCOUNTER — Other Ambulatory Visit: Payer: No Typology Code available for payment source

## 2013-09-03 ENCOUNTER — Encounter (HOSPITAL_COMMUNITY): Payer: Self-pay | Admitting: *Deleted

## 2013-09-03 DIAGNOSIS — IMO0001 Reserved for inherently not codable concepts without codable children: Secondary | ICD-10-CM

## 2013-09-03 DIAGNOSIS — O0990 Supervision of high risk pregnancy, unspecified, unspecified trimester: Secondary | ICD-10-CM

## 2013-09-03 DIAGNOSIS — IMO0002 Reserved for concepts with insufficient information to code with codable children: Secondary | ICD-10-CM

## 2013-09-03 LAB — CBC
HCT: 35.3 % — ABNORMAL LOW (ref 36.0–46.0)
Hemoglobin: 12.3 g/dL (ref 12.0–15.0)
MCH: 29.8 pg (ref 26.0–34.0)
MCHC: 34.8 g/dL (ref 30.0–36.0)
MCV: 85.5 fL (ref 78.0–100.0)
Platelets: 184 10*3/uL (ref 150–400)
RBC: 4.13 MIL/uL (ref 3.87–5.11)
RDW: 13.3 % (ref 11.5–15.5)
WBC: 8.2 10*3/uL (ref 4.0–10.5)

## 2013-09-03 LAB — TYPE AND SCREEN
ABO/RH(D): O POS
Antibody Screen: NEGATIVE

## 2013-09-03 LAB — PROTEIN / CREATININE RATIO, URINE
Creatinine, Urine: 102.12 mg/dL
Protein Creatinine Ratio: 0.47 — ABNORMAL HIGH (ref 0.00–0.15)
Total Protein, Urine: 47.7 mg/dL

## 2013-09-03 LAB — RPR: RPR Ser Ql: NONREACTIVE

## 2013-09-03 MED ORDER — FENTANYL CITRATE 0.05 MG/ML IJ SOLN
50.0000 ug | INTRAMUSCULAR | Status: DC | PRN
  Administered 2013-09-03: 50 ug via INTRAVENOUS
  Filled 2013-09-03: qty 2

## 2013-09-03 MED ORDER — ZOLPIDEM TARTRATE 5 MG PO TABS: 5.0000 mg | ORAL_TABLET | Freq: Every evening | ORAL | Status: DC | PRN

## 2013-09-03 MED ORDER — LIDOCAINE HCL (PF) 1 % IJ SOLN
30.0000 mL | INTRAMUSCULAR | Status: DC | PRN
  Administered 2013-09-03: 30 mL via SUBCUTANEOUS
  Filled 2013-09-03: qty 30

## 2013-09-03 MED ORDER — TETANUS-DIPHTH-ACELL PERTUSSIS 5-2.5-18.5 LF-MCG/0.5 IM SUSP: 0.5000 mL | Freq: Once | INTRAMUSCULAR | Status: DC

## 2013-09-03 MED ORDER — BENZOCAINE-MENTHOL 20-0.5 % EX AERO: 1.0000 "application " | INHALATION_SPRAY | CUTANEOUS | Status: DC | PRN

## 2013-09-03 MED ORDER — PRENATAL MULTIVITAMIN CH
1.0000 | ORAL_TABLET | Freq: Every day | ORAL | Status: DC
  Administered 2013-09-03 – 2013-09-04 (×2): 1 via ORAL
  Filled 2013-09-03 (×2): qty 1

## 2013-09-03 MED ORDER — ONDANSETRON HCL 4 MG PO TABS: 4.0000 mg | ORAL_TABLET | ORAL | Status: DC | PRN

## 2013-09-03 MED ORDER — FLEET ENEMA 7-19 GM/118ML RE ENEM: 1.0000 | ENEMA | RECTAL | Status: DC | PRN

## 2013-09-03 MED ORDER — ACETAMINOPHEN 325 MG PO TABS: 650.0000 mg | ORAL_TABLET | ORAL | Status: DC | PRN

## 2013-09-03 MED ORDER — FENTANYL CITRATE 0.05 MG/ML IJ SOLN
100.0000 ug | INTRAMUSCULAR | Status: DC | PRN
  Administered 2013-09-03: 100 ug via INTRAVENOUS
  Filled 2013-09-03: qty 2

## 2013-09-03 MED ORDER — DIBUCAINE 1 % RE OINT: 1.0000 "application " | TOPICAL_OINTMENT | RECTAL | Status: DC | PRN

## 2013-09-03 MED ORDER — IBUPROFEN 600 MG PO TABS
600.0000 mg | ORAL_TABLET | Freq: Four times a day (QID) | ORAL | Status: DC | PRN
  Administered 2013-09-03: 600 mg via ORAL
  Filled 2013-09-03: qty 1

## 2013-09-03 MED ORDER — OXYCODONE-ACETAMINOPHEN 5-325 MG PO TABS: 1.0000 | ORAL_TABLET | ORAL | Status: DC | PRN

## 2013-09-03 MED ORDER — IBUPROFEN 600 MG PO TABS
600.0000 mg | ORAL_TABLET | Freq: Four times a day (QID) | ORAL | Status: DC
  Administered 2013-09-03 – 2013-09-04 (×3): 600 mg via ORAL
  Filled 2013-09-03 (×5): qty 1

## 2013-09-03 MED ORDER — LACTATED RINGERS IV SOLN
INTRAVENOUS | Status: DC
  Administered 2013-09-03: 02:00:00 via INTRAVENOUS

## 2013-09-03 MED ORDER — OXYTOCIN BOLUS FROM INFUSION: 500.0000 mL | INTRAVENOUS | Status: DC

## 2013-09-03 MED ORDER — SIMETHICONE 80 MG PO CHEW: 80.0000 mg | CHEWABLE_TABLET | ORAL | Status: DC | PRN

## 2013-09-03 MED ORDER — DIPHENHYDRAMINE HCL 25 MG PO CAPS: 25.0000 mg | ORAL_CAPSULE | Freq: Four times a day (QID) | ORAL | Status: DC | PRN

## 2013-09-03 MED ORDER — LACTATED RINGERS IV SOLN: 500.0000 mL | INTRAVENOUS | Status: DC | PRN

## 2013-09-03 MED ORDER — LANOLIN HYDROUS EX OINT: TOPICAL_OINTMENT | CUTANEOUS | Status: DC | PRN

## 2013-09-03 MED ORDER — ONDANSETRON HCL 4 MG/2ML IJ SOLN: 4.0000 mg | INTRAMUSCULAR | Status: DC | PRN

## 2013-09-03 MED ORDER — SENNOSIDES-DOCUSATE SODIUM 8.6-50 MG PO TABS
2.0000 | ORAL_TABLET | ORAL | Status: DC
  Administered 2013-09-04: 2 via ORAL
  Filled 2013-09-03: qty 2

## 2013-09-03 MED ORDER — WITCH HAZEL-GLYCERIN EX PADS: 1.0000 "application " | MEDICATED_PAD | CUTANEOUS | Status: DC | PRN

## 2013-09-03 MED ORDER — CITRIC ACID-SODIUM CITRATE 334-500 MG/5ML PO SOLN: 30.0000 mL | ORAL | Status: DC | PRN

## 2013-09-03 MED ORDER — ONDANSETRON HCL 4 MG/2ML IJ SOLN: 4.0000 mg | Freq: Four times a day (QID) | INTRAMUSCULAR | Status: DC | PRN

## 2013-09-03 MED ORDER — OXYTOCIN 40 UNITS IN LACTATED RINGERS INFUSION - SIMPLE MED
62.5000 mL/h | INTRAVENOUS | Status: DC
  Administered 2013-09-03: 62.5 mL/h via INTRAVENOUS
  Filled 2013-09-03: qty 1000

## 2013-09-03 NOTE — Lactation Note (Signed)
This note was copied from the chart of Tonya Simmons. Lactation Consultation Note Initial consult:  Baby Tonya 4 hours old.  Mother speaks some english but used interpreter [phone] and in house interpreter.  Reviewed hand expression, mother has good drops of colostrum.  Mother pleased. Mother attempted to breastfeed baby in cradle position but baby sleepy.  Mother states she breastfed her other child for 7 months.  Reviewed basics, cluster feeding, cue based feeding, newborn stomach size, lactation support services and spanish lactation brochure.  Mother denies any problems and has no questions.  Encouraged mother to call for further assistance.   Patient Name: Tonya Simmons ONGEX'BToday's Date: 09/03/2013 Reason for consult: Initial assessment   Maternal Data Formula Feeding for Exclusion: Yes Reason for exclusion: Mother's choice to formula and breast feed on admission Infant to breast within first hour of birth: Yes Has patient been taught Hand Expression?: Yes Does the patient have breastfeeding experience prior to this delivery?: Yes  Feeding    LATCH Score/Interventions                      Lactation Tools Discussed/Used     Consult Status      Hardie PulleyBerkelhammer, Ruth Boschen 09/03/2013, 1:16 PM

## 2013-09-03 NOTE — MAU Note (Signed)
Pt reports uc's q 2-3 minutes since 2100

## 2013-09-03 NOTE — Progress Notes (Signed)
Tonya ListSusana Simmons is a 33 y.o. G3P1011 at 5588w0d by ultrasound admitted for active labor  Subjective: Increasing pressure, would like pain medication through IV  Objective: BP 142/87  Pulse 72  Temp(Src) 98.1 F (36.7 C) (Oral)  Resp 18  Ht 5' 1.5" (1.562 m)  Wt 255 lb (115.667 kg)  BMI 47.41 kg/m2  LMP 11/19/2012      FHT:  FHR: 130 bpm, variability: moderate,  accelerations:  Present,  decelerations:  Present variable UC:   regular, every 2-3 minutes SVE:   Dilation: 7 Effacement (%): 90 Station: 0 Exam by:: Dr. Clinton Simmons  Labs: Lab Results  Component Value Date   WBC 8.2 09/03/2013   HGB 12.3 09/03/2013   HCT 35.3* 09/03/2013   MCV 85.5 09/03/2013   PLT 184 09/03/2013    Assessment / Plan: Spontaneous labor, progressing normally  Labor: Progressing normally Fetal Wellbeing:  Category II - variable decels Pain Control:  Labor support without medications I/D:  n/a Anticipated MOD:  NSVD  Tonya Simmons 09/03/2013, 6:29 AM

## 2013-09-03 NOTE — Progress Notes (Signed)
UR completed 

## 2013-09-03 NOTE — Progress Notes (Signed)
Tonya Simmons is a 33 y.o. G3P1011 at 3256w0d by ultrasound admitted for active labor  Subjective: Pt feeling regular contractions, more pressure in her pelvis  Objective: BP 142/87  Pulse 72  Temp(Src) 98.1 F (36.7 C) (Oral)  Resp 18  Ht 5' 1.5" (1.562 m)  Wt 255 lb (115.667 kg)  BMI 47.41 kg/m2  LMP 11/19/2012      FHT:  FHR: 135 bpm, variability: moderate,  accelerations:  Present,  decelerations:  Present irregular; poor FHR tracing secondary to maternal body habitus; Cat II UC:   regular, every 2-3 minutes SVE:   Dilation: 5 Effacement (%): 80 Station: -1 Exam by:: Dr. Clinton SawyerWilliamson  Labs: Lab Results  Component Value Date   WBC 8.2 09/03/2013   HGB 12.3 09/03/2013   HCT 35.3* 09/03/2013   MCV 85.5 09/03/2013   PLT 184 09/03/2013    Assessment / Plan: Spontaneous labor, progressing normally  Labor: Progressing normally Fetal Wellbeing:  Category II - variable decels Pain Control:  Labor support without medications I/D:  n/a Anticipated MOD:  NSVD  Kerington Hildebrant 09/03/2013, 4:01 AM

## 2013-09-03 NOTE — H&P (Signed)
Tyrell Brereton is a 33 y.o. female G3P1011 with IUP at [redacted]w[redacted]d presenting for contractions. Pt states she has been having regular, every 2-3 minutes contractions, associated with none vaginal bleeding.  Membranes are intact, with active fetal movement.   [facility]  Prenatal History/Complications: Elevated b/p at 34 weeks X2, normal testing since Past Medical History: Past Medical History  Diagnosis Date  . Hypertension in pregnancy, antepartum 12/11/2011    Borderline dx: no antenatal testing at this time (01/23/12)     Past Surgical History: Past Surgical History  Procedure Laterality Date  . No past surgeries      Obstetrical History: OB History   Grav Para Term Preterm Abortions TAB SAB Ect Mult Living   3 1 1  1  1   1        Social History: History   Social History  . Marital Status: Married    Spouse Name: N/A    Number of Children: N/A  . Years of Education: N/A   Social History Main Topics  . Smoking status: Never Smoker   . Smokeless tobacco: Never Used  . Alcohol Use: No  . Drug Use: No  . Sexual Activity: Not Currently   Other Topics Concern  . None   Social History Narrative  . None    Family History: Family History  Problem Relation Age of Onset  . Hypertension Father   . Stroke Father   . Osteoporosis Mother     Allergies: No Known Allergies  Prescriptions prior to admission  Medication Sig Dispense Refill  . clotrimazole (GYNE-LOTRIMIN) 1 % vaginal cream Place 1 Applicatorful vaginally at bedtime. For 7 days.  45 g  0  . ondansetron (ZOFRAN-ODT) 4 MG disintegrating tablet Take 1 tablet (4 mg total) by mouth every 8 (eight) hours as needed for nausea.  20 tablet  0  . Prenatal Vit-Fe Fumarate-FA (PRENATAL MULTIVITAMIN) TABS Take 1 tablet by mouth daily.         Prenatal Transfer Tool  Maternal Diabetes: No Genetic Screening: Declined Maternal Ultrasounds/Referrals: Normal Fetal Ultrasounds or other Referrals:   None Maternal Substance Abuse:  No Significant Maternal Medications:  None Significant Maternal Lab Results: None     Review of Systems   Constitutional: Negative for fever, chills, weight loss, malaise/fatigue and diaphoresis.  HENT: Negative for hearing loss, ear pain, nosebleeds, congestion, sore throat, neck pain, tinnitus and ear discharge.   Eyes: Negative for blurred vision, double vision, photophobia, pain, discharge and redness.  Respiratory: Negative for cough, hemoptysis, sputum production, shortness of breath, wheezing and stridor.   Cardiovascular: Negative for chest pain, palpitations, orthopnea,  leg swelling  Gastrointestinal: Positive for abdominal pain. Negative for heartburn, nausea, vomiting, diarrhea, constipation, blood in stool Genitourinary: Negative for dysuria, urgency, frequency, hematuria and flank pain.  Musculoskeletal: Negative for myalgias, back pain, joint pain and falls.  Skin: Negative for itching and rash.  Neurological: Negative for dizziness, tingling, tremors, sensory change, speech change, focal weakness, seizures, loss of consciousness, weakness and headaches.  Endo/Heme/Allergies: Negative for environmental allergies and polydipsia. Does not bruise/bleed easily.  Psychiatric/Behavioral: Negative for depression, suicidal ideas, hallucinations, memory loss and substance abuse. The patient is not nervous/anxious and does not have insomnia.       Blood pressure 125/93, pulse 83, temperature 97.7 F (36.5 C), temperature source Oral, resp. rate 18, height 5' 1.5" (1.562 m), weight 115.667 kg (255 lb), last menstrual period 11/19/2012. General appearance: alert, cooperative and no distress  Lungs: clear to auscultation bilaterally Heart: regular rate and rhythm Abdomen: soft, non-tender; bowel sounds normal Pelvic: ROM with clear fluid during vaginal exam Extremities: Homans sign is negative, no sign of DVT DTR's 2+ Presentation: cephalic Fetal  monitoringBaseline: 140 bpm, Variability: Good {> 6 bpm), Accelerations: Reactive and Decelerations: Absent Uterine activity  q 2 minutes  Dilation: 5.5 Effacement (%): 80 Station: -2 Exam by:: B Mosca   Prenatal labs: ABO, Rh: O/POS/-- (09/05 0904) Antibody: NEG (09/05 0904) Rubella:  immune RPR: NON REAC (12/04 0948)  HBsAg: NEGATIVE (09/05 0904)  HIV: NON REACTIVE (12/04 0948)  GBS: NEGATIVE (02/04 1615)  1 hr Glucola 184; normal 3 hour Genetic screening  declined Anatomy US normal; EFW>90%; 1st baby 7lb 4 oz   No results found for this or any previous visit (from the past 24 hour(s)).  Assessment: Bernell ListSusana Espinoza is a 33 y.o. G3P1011 with an IUP at 242w0d presenting for active labor/SROM  Plan: Expectant management   CRESENZO-DISHMAN,Nathon Stefanski 09/03/2013, 1:30 AM

## 2013-09-04 LAB — CBC
HCT: 30.1 % — ABNORMAL LOW (ref 36.0–46.0)
Hemoglobin: 10.2 g/dL — ABNORMAL LOW (ref 12.0–15.0)
MCH: 29.1 pg (ref 26.0–34.0)
MCHC: 33.9 g/dL (ref 30.0–36.0)
MCV: 86 fL (ref 78.0–100.0)
Platelets: 162 10*3/uL (ref 150–400)
RBC: 3.5 MIL/uL — ABNORMAL LOW (ref 3.87–5.11)
RDW: 13.5 % (ref 11.5–15.5)
WBC: 10.5 10*3/uL (ref 4.0–10.5)

## 2013-09-04 NOTE — Discharge Summary (Signed)
Attestation of Attending Supervision of Advanced Practitioner (CNM/NP): Evaluation and management procedures were performed by the Advanced Practitioner under my supervision and collaboration.  I have reviewed the Advanced Practitioner's note and chart, and I agree with the management and plan.  HARRAWAY-SMITH, Jeanne Terrance 2:09 PM

## 2013-09-04 NOTE — Discharge Summary (Signed)
Obstetric Discharge Summary Reason for Admission: onset of labor Prenatal Procedures: NST and ultrasound - pt with multiple prenatal NST given initial concern for mild pre-eclampsia but no evidence of pre-eclampsia on testing and NST's normal  Intrapartum Procedures: spontaneous vaginal delivery Postpartum Procedures: none Complications-Operative and Postpartum: 2nd degree perineal laceration Hemoglobin  Date Value Ref Range Status  09/04/2013 10.2* 12.0 - 15.0 g/dL Final     REPEATED TO VERIFY     DELTA CHECK NOTED     HCT  Date Value Ref Range Status  09/04/2013 30.1* 36.0 - 46.0 % Final    Physical Exam:  General: alert, cooperative and appears stated age 35Lochia: appropriate Uterine Fundus: firm DVT Evaluation: No evidence of DVT seen on physical exam.  Discharge Diagnoses: Term Pregnancy-delivered  Discharge Information: Date: 09/04/2013 Activity: pelvic rest Diet: routine Medications: None Condition: stable Instructions: refer to practice specific booklet Discharge to: home   Newborn Data: Live born female  Birth Weight: 7 lb 14.6 oz (3590 g) APGAR: 8, 9  Home with mother.  Mat CarneWILLIAMSON, EDWARD 09/04/2013, 10:43 AM  I have seen and examined this patient and agree with above documentation in the resident's note. Pt is breast feeding and desires IUD for contraception.  [facility]. BP have been stable since delivery.    Rulon AbideKeli Sione Baumgarten, M.D. [facility] 09/04/2013 11:11 AM

## 2013-09-04 NOTE — Lactation Note (Signed)
This note was copied from the chart of Tonya Simmons. Lactation Consultation Note    Follow up consult with this mom and baby, now 5127 hours old. Mom breast fed her first child for 7 months. She reports breast feeding going well. She told me she thought she did not have enough milk from her right beast. I did hand expression for her, and showed her she has lots of colostrum from each breast. She has given formula once. I told her she does not need the formula, and explained how little the baby needs for the first 2 days, how the stomach size changes from day 1 -3, and how her milk comes in between day 2-3. Mom wants to go home today. I think  This would be fine from a lactation point of view.   Patient Name: Tonya Simmons YNWGN'FToday's Date: 09/04/2013 Reason for consult: Follow-up assessment   Maternal Data    Feeding Feeding Type: Formula Nipple Type: Slow - flow  LATCH Score/Interventions                      Lactation Tools Discussed/Used     Consult Status Consult Status: Complete Follow-up type: Call as needed    Alfred LevinsLee, Havyn Ramo Anne 09/04/2013, 12:19 PM

## 2013-09-05 ENCOUNTER — Ambulatory Visit: Payer: Self-pay

## 2013-09-05 NOTE — H&P (Signed)
Attestation of Attending Supervision of Advanced Practitioner (PA/CNM/NP): Evaluation and management procedures were performed by the Advanced Practitioner under my supervision and collaboration.  I have reviewed the Advanced Practitioner's note and chart, and I agree with the management and plan.  Navea Woodrow, MD, FACOG Attending Obstetrician & Gynecologist Faculty Practice, [facility]   

## 2013-09-05 NOTE — Lactation Note (Signed)
This note was copied from the chart of Tonya Simmons. Lactation Consultation Note     Brief follow up consult with this mom and baby, now 47 hours post partum. Mom reports breast feeding going well, and is hoping baby will go home today. Baby's bili is up, and the pediatrician  will decided if she can be discharged today. Mom knows to call lactation for questions/concerns  Patient Name: Tonya Simmons JXBJY'NToday's Date: 09/05/2013 Reason for consult: Follow-up assessment   Maternal Data    Feeding    LATCH Score/Interventions                      Lactation Tools Discussed/Used     Consult Status Consult Status: Complete Follow-up type: Call as needed    Alfred LevinsLee, Janice Bodine Anne 09/05/2013, 8:37 AM

## 2013-09-10 ENCOUNTER — Encounter: Payer: Self-pay | Admitting: Family Medicine

## 2013-10-20 ENCOUNTER — Ambulatory Visit (INDEPENDENT_AMBULATORY_CARE_PROVIDER_SITE_OTHER): Payer: No Typology Code available for payment source | Admitting: Family Medicine

## 2013-10-20 ENCOUNTER — Encounter: Payer: Self-pay | Admitting: Family Medicine

## 2013-10-20 VITALS — BP 126/80 | HR 80 | Temp 99.3°F | Ht 61.5 in | Wt 243.0 lb

## 2013-10-20 DIAGNOSIS — IMO0001 Reserved for inherently not codable concepts without codable children: Secondary | ICD-10-CM

## 2013-10-20 DIAGNOSIS — Z309 Encounter for contraceptive management, unspecified: Secondary | ICD-10-CM

## 2013-10-20 DIAGNOSIS — E669 Obesity, unspecified: Secondary | ICD-10-CM

## 2013-10-20 DIAGNOSIS — Z3002 Counseling and instruction in natural family planning to avoid pregnancy: Secondary | ICD-10-CM

## 2013-10-20 LAB — POCT URINE PREGNANCY: Preg Test, Ur: NEGATIVE

## 2013-10-20 MED ORDER — NORETHINDRONE 0.35 MG PO TABS: 1.0000 | ORAL_TABLET | Freq: Every day | ORAL | Status: DC

## 2013-10-20 NOTE — Progress Notes (Signed)
   Subjective:    Patient ID: Tonya Simmons, female    DOB: February 24, 1981, 33 y.o.   MRN: [id number]  HPI  33 year old [id number] who is following up for post natal care after SVD. She delivered a healthy viable baby girl, and the only complication was a 2nd degree perineal laceration on 09/03/13. The prenatal course was complicated by transient hypertension which resolved. It did not require treatment. Today, the patient denies any problems. She denies depression or sadness. She is currently breast-feeding her daughter. She would like to address contraception.  Contraception - The patient has not had sex since her delivery. She denies having a period. She would like an IUD for contraception. She has a history of PID or uterine abnormality.  Obesity - The patient is trying to improve her diet and increasing her exercise. She recognizes the importance of weight loss.    Review of Systems See HPI    Objective:   Physical Exam BP 126/80  Pulse 80  Temp(Src) 99.3 F (37.4 C) (Oral)  Ht 5' 1.5" (1.562 m)  Wt 243 lb (110.224 kg)  BMI 45.18 kg/m2  Gen: obese Hispanic female, non-ill-appearing, pleasant Abdomen: obese protuberant GYN: deferred Psych: normal affect, thought content, and speech pattern  PQH9 - 0      Assessment & Plan:

## 2013-10-20 NOTE — Assessment & Plan Note (Signed)
Patient counseled on weight loss strategies. Followup in one month.

## 2013-10-20 NOTE — Patient Instructions (Signed)
Estoy muy feliz de verte hoy.  1. Para su prdida de peso - por favor ejercen 4-5 veces por semana durante 30 minutos.  2. Control de la natalidad - Comience a tomar las pldoras hoy. No tenga relaciones sexuales durante 1 semana a menos que utilice preservativo. Usted debe obtener una carta del jefe de su marido y llevarlo al departamento de Freight forwardersalud.  Regreso en 1 mes para un control de peso y la presin arterial.  Dr. Clinton SawyerWilliamson

## 2013-10-20 NOTE — Assessment & Plan Note (Signed)
Patient prefers IUD but this may be difficult to obtain given her finances and residential status. She was given information about a program through the health Department. Additionally, she was given a prescription for Micronor tablets to begin today. We will obtain a urine pregnancy test prior to initiating this.

## 2013-11-03 ENCOUNTER — Encounter: Payer: Self-pay | Admitting: *Deleted

## 2013-11-11 ENCOUNTER — Encounter: Payer: No Typology Code available for payment source | Admitting: Family Medicine

## 2013-11-18 ENCOUNTER — Ambulatory Visit: Payer: No Typology Code available for payment source | Admitting: Family Medicine

## 2014-04-08 ENCOUNTER — Ambulatory Visit: Payer: Self-pay

## 2014-05-16 ENCOUNTER — Encounter: Payer: Self-pay | Admitting: Family Medicine

## 2014-07-11 ENCOUNTER — Encounter: Payer: Self-pay | Admitting: Family Medicine

## 2014-07-11 ENCOUNTER — Ambulatory Visit (INDEPENDENT_AMBULATORY_CARE_PROVIDER_SITE_OTHER): Payer: Self-pay | Admitting: Family Medicine

## 2014-07-11 VITALS — BP 130/98 | HR 94 | Temp 98.2°F | Ht 61.0 in | Wt 253.1 lb

## 2014-07-11 DIAGNOSIS — J189 Pneumonia, unspecified organism: Secondary | ICD-10-CM | POA: Insufficient documentation

## 2014-07-11 DIAGNOSIS — R030 Elevated blood-pressure reading, without diagnosis of hypertension: Secondary | ICD-10-CM | POA: Insufficient documentation

## 2014-07-11 MED ORDER — AZITHROMYCIN 250 MG PO TABS: ORAL_TABLET | ORAL | Status: DC

## 2014-07-11 NOTE — Assessment & Plan Note (Signed)
Diagnosed cliniically Tx with azythromycin Return precautions reviewed in detail

## 2014-07-11 NOTE — Assessment & Plan Note (Signed)
BP initially elevated to 173/103, recheck improved to 130/98 No red flags, Feel dyspnea is related to CAP F/u with PCP in 3-4 week for re-check when not ill and to discuss lifestyle changes.

## 2014-07-11 NOTE — Patient Instructions (Signed)
Great to meet you!  I have sent a prescription to your pharmacy. Please take 2 pills on day 1 and 1 pill daily after that  Neumona (Pneumonia) La neumona es una infeccin en los pulmones.  CAUSAS La neumona puede estar causada por una bacteria o un virus. Generalmente, estas infecciones estn causadas por la aspiracin de partculas infecciosas que ingresan a los pulmones (vas respiratorias). SIGNOS Y SNTOMAS   Tos.  Grant RutsFiebre.  Dolor en el pecho.  Frecuencia respiratoria aumentada.  Sibilancias.  Produccin de mucosidad. DIAGNSTICO  Si presenta los sntomas comunes de la neumona, el mdico normalmente confirmar el diagnstico con una radiografa de trax. Si tiene neumona, la radiografa mostrar una anomala en el pulmn (infiltrados pulmonares). Podrn realizarse otras pruebas de [place], Com[place]orina o esputo para encontrar la causa especfica de su neumona. El mdico tambin puede Leisure centre managerhacer pruebas (como gases en sangre o una oximetra de pulso) para verificar el correcto funcionamiento de los pulmones. TRATAMIENTO  Algunos tipos de neumona pueden contagiarse a otras personas al toser o Engineering geologistestornudar. Es posible que le pidan que utilice una mscara antes y Programmer, applicationsdurante el examen. Si la neumona est causada por una bacteria, puede tratarse con medicamentos antibiticos. Si la neumona es causada por el virus de la gripe, puede tratarse con medicamentos antivirales. La Harley-Davidsonmayora de las dems infecciones virales deben seguir su curso. Estas infecciones no respondern a los antibiticos.  INSTRUCCIONES PARA EL CUIDADO EN EL HOGAR   Los inhibidores de la tos pueden utilizarse si no American Expressdescansa bien. Sin embargo, la tos, al Intellimpiar los pulmones, brinda una proteccin. Debe evitar, en lo posible, utilizar medicamentos para detener la tos.  Es posible que el mdico le haya recetado medicamentos si cree que la causa de su neumona es una bacteria o gripe. Finalice los United Parcelmedicamentos, aunque comience a  Actorsentirse mejor.  El mdico tambin puede haberle recetado un expectorante. Este afloja la mucosidad, para poder eliminarla con la tos.  Tome los medicamentos solamente como se lo haya indicado el mdico.  No fume. El fumar es una de las causas ms frecuentes de bronquitis y puede contribuir a la neumona. Si es fumador y IT consultantcontina hacindolo, la tos puede durar varias semanas despus de que la neumona haya desaparecido.  Un vaporizador o humidificador con vapor fro en la habitacin o en la casa puede ayudar a aflojar la mucosidad.  La tos generalmente empeora por la noche. Duerma semisentado en Loni Dollyuna reposera o use un par de almohadas debajo de la cabeza.  Haga reposo todo el tiempo que lo necesite. El organismo por lo general le har saber si tiene ganas de descansar. PREVENCIN La vacuna antineumocccica est disponible para prevenir la neumona bacteriana comn. Habitualmente, se recomienda para:  Personas mayores de 65 aos.  Pacientes que estn en tratamiento de quimioterapia.  Personas con trastornos pulmonares crnicos, como bronquitis o enfisema.  Personas con problemas del sistema inmunolgico. Si usted es mayor de 65 aos o tiene un trastorno que lo pone en situacin de alto riesgo, es posible que reciba una vacuna antineumoccica, si todava no la tiene. En algunos pases, tambin se recomienda la aplicacin de rutina de la vacuna contra la gripe. Esta vacuna puede ayudar a prevenir algunos casos de neumona. Es posible que le ofrezcan aplicarse la vacuna contra la gripe como parte del Bradfordtratamiento. Si fuma, es el momento de abandonar el hbito. Puede recibir instrucciones acerca de cmo dejar de fumar. El mdico puede darle medicamentos y asesoramiento para ayudarlo. SOLICITE ATENCIN  MDICA SI: Tiene fiebre. SOLICITE ATENCIN MDICA DE INMEDIATO SI:   La enfermedad empeora. Esto vale especialmente en el caso de que usted sea una persona mayor o se encuentre dbil por otra  enfermedad.  No puede controlar la tos con antitusivos y no puede dormir debido a Secretary/administratorello.  Comienza a escupir sangre al toser.  El dolor empeora o no puede controlarlo con los medicamentos.  Alguno de los sntomas que inicialmente lo llevaron a la Hydrologistconsulta empeora en vez de mejorar.  Siente falta de aire o Journalist, newspaperdolor en el pecho. ASEGRESE DE QUE:   Comprende estas instrucciones.  Controlar su afeccin.  Recibir ayuda de inmediato si no mejora o si empeora. Document Released: 04/10/2005 Document Revised: 11/15/2013 [facility] FargoExitCare Patient Information 2015 [place]Care, [place]. This information is not intended to replace advice given to you by your health care provider. Make sure you discuss any questions you have with your health care provider.

## 2014-07-11 NOTE — Progress Notes (Signed)
Patient ID: Tonya BakeSusana Simmons, female   DOB: 26-Feb-1981, 33 y.o.   MRN: [id number]   HPI  Patient presents today for cough and dyspnea  Patient states that she's had a dry cough over the last 3 weeks. For the last 3-4 days she's developed dyspnea, subjective fevers, generalized malaise, and decreased appetite. She still tolerating oral fluids easily. She also notes severe headache on Saturday which is much more mild today the persistent.  She denies any chronic respiratory medical problems  Blood pressure Patient states she's never had hypertension but has had high blood pressure during pregnancy. No chest pain  Smoking status noted ROS: Per HPI  Objective: BP 130/98 mmHg  Pulse 94  Temp(Src) 98.2 F (36.8 C) (Oral)  Ht 5\' 1"  (1.549 m)  Wt 253 lb 1.6 oz (114.805 kg)  BMI 47.85 kg/m2 Gen: NAD, alert, cooperative with exam HEENT: NCAT, EOMI, PERRL, TMs normal bilaterally, MMM, oropharynx clear CV: RRR, good S1/S2, no murmur Resp: Nonlabored, cough intermittently with deep breath, left lung fields with slightly coarse expiratory sounds compared to right Ext: No edema, warm Neuro: Alert and oriented, No gross deficits  Assessment and plan:  CAP (community acquired pneumonia) Diagnosed cliniically Tx with azythromycin Return precautions reviewed in detail  Elevated blood pressure (not hypertension) BP initially elevated to 173/103, recheck improved to 130/98 No red flags, Feel dyspnea is related to CAP F/u with PCP in 3-4 week for re-check when not ill and to discuss lifestyle changes.       Meds ordered this encounter  Medications  . azithromycin (ZITHROMAX) 250 MG tablet    Sig: Take 2 pills on day 1 then 1 pill daily until finished    Dispense:  6 tablet    Refill:  0

## 2014-07-29 ENCOUNTER — Encounter (HOSPITAL_COMMUNITY): Payer: Self-pay | Admitting: *Deleted

## 2014-07-29 ENCOUNTER — Emergency Department (HOSPITAL_COMMUNITY)
Admission: EM | Admit: 2014-07-29 | Discharge: 2014-07-29 | Disposition: A | Discharge status: Home / Self Care | Payer: Self-pay | Attending: Emergency Medicine | Admitting: Emergency Medicine

## 2014-07-29 DIAGNOSIS — Z3202 Encounter for pregnancy test, result negative: Secondary | ICD-10-CM | POA: Insufficient documentation

## 2014-07-29 DIAGNOSIS — R51 Headache: Secondary | ICD-10-CM | POA: Insufficient documentation

## 2014-07-29 DIAGNOSIS — R1012 Left upper quadrant pain: Secondary | ICD-10-CM | POA: Insufficient documentation

## 2014-07-29 DIAGNOSIS — R1013 Epigastric pain: Secondary | ICD-10-CM | POA: Insufficient documentation

## 2014-07-29 DIAGNOSIS — R112 Nausea with vomiting, unspecified: Secondary | ICD-10-CM | POA: Insufficient documentation

## 2014-07-29 DIAGNOSIS — R109 Unspecified abdominal pain: Secondary | ICD-10-CM

## 2014-07-29 DIAGNOSIS — R197 Diarrhea, unspecified: Secondary | ICD-10-CM | POA: Insufficient documentation

## 2014-07-29 LAB — URINALYSIS, ROUTINE W REFLEX MICROSCOPIC
Bilirubin Urine: NEGATIVE
Glucose, UA: NEGATIVE mg/dL
Ketones, ur: NEGATIVE mg/dL
Nitrite: NEGATIVE
Protein, ur: 30 mg/dL — AB
Specific Gravity, Urine: 1.008 (ref 1.005–1.030)
Urobilinogen, UA: 0.2 mg/dL (ref 0.0–1.0)
pH: 5.5 (ref 5.0–8.0)

## 2014-07-29 LAB — COMPREHENSIVE METABOLIC PANEL
ALT: 82 U/L — ABNORMAL HIGH (ref 0–35)
AST: 54 U/L — ABNORMAL HIGH (ref 0–37)
Albumin: 4 g/dL (ref 3.5–5.2)
Alkaline Phosphatase: 72 U/L (ref 39–117)
Anion gap: 10 (ref 5–15)
BUN: 11 mg/dL (ref 6–23)
CO2: 20 mmol/L (ref 19–32)
Calcium: 8.8 mg/dL (ref 8.4–10.5)
Chloride: 105 mEq/L (ref 96–112)
Creatinine, Ser: 1.01 mg/dL (ref 0.50–1.10)
GFR calc Af Amer: 84 mL/min — ABNORMAL LOW (ref >90)
GFR calc non Af Amer: 72 mL/min — ABNORMAL LOW (ref >90)
Glucose, Bld: 131 mg/dL — ABNORMAL HIGH (ref 70–99)
Potassium: 3.7 mmol/L (ref 3.5–5.1)
Sodium: 135 mmol/L (ref 135–145)
Total Bilirubin: 0.4 mg/dL (ref 0.3–1.2)
Total Protein: 7.4 g/dL (ref 6.0–8.3)

## 2014-07-29 LAB — CBC WITH DIFFERENTIAL/PLATELET
Basophils Absolute: 0 10*3/uL (ref 0.0–0.1)
Basophils Relative: 0 % (ref 0–1)
Eosinophils Absolute: 0.1 10*3/uL (ref 0.0–0.7)
Eosinophils Relative: 1 % (ref 0–5)
HCT: 39.6 % (ref 36.0–46.0)
Hemoglobin: 13.8 g/dL (ref 12.0–15.0)
Lymphocytes Relative: 12 % (ref 12–46)
Lymphs Abs: 1.1 10*3/uL (ref 0.7–4.0)
MCH: 29.8 pg (ref 26.0–34.0)
MCHC: 34.8 g/dL (ref 30.0–36.0)
MCV: 85.5 fL (ref 78.0–100.0)
Monocytes Absolute: 0.5 10*3/uL (ref 0.1–1.0)
Monocytes Relative: 5 % (ref 3–12)
Neutro Abs: 7 10*3/uL (ref 1.7–7.7)
Neutrophils Relative %: 82 % — ABNORMAL HIGH (ref 43–77)
Platelets: 170 10*3/uL (ref 150–400)
RBC: 4.63 MIL/uL (ref 3.87–5.11)
RDW: 12.5 % (ref 11.5–15.5)
WBC: 8.6 10*3/uL (ref 4.0–10.5)

## 2014-07-29 LAB — POC URINE PREG, ED: Preg Test, Ur: NEGATIVE

## 2014-07-29 LAB — URINE MICROSCOPIC-ADD ON

## 2014-07-29 LAB — LIPASE, BLOOD: Lipase: 33 U/L (ref 11–59)

## 2014-07-29 MED ORDER — FAMOTIDINE 20 MG PO TABS: 20.0000 mg | ORAL_TABLET | Freq: Two times a day (BID) | ORAL | Status: DC

## 2014-07-29 MED ORDER — LOPERAMIDE HCL 2 MG PO CAPS: 2.0000 mg | ORAL_CAPSULE | Freq: Four times a day (QID) | ORAL | Status: DC | PRN

## 2014-07-29 MED ORDER — SODIUM CHLORIDE 0.9 % IV BOLUS (SEPSIS)
1000.0000 mL | Freq: Once | INTRAVENOUS | Status: AC
  Administered 2014-07-29: 1000 mL via INTRAVENOUS

## 2014-07-29 MED ORDER — ONDANSETRON HCL 4 MG/2ML IJ SOLN
4.0000 mg | Freq: Once | INTRAMUSCULAR | Status: AC
  Administered 2014-07-29: 4 mg via INTRAVENOUS
  Filled 2014-07-29: qty 2

## 2014-07-29 MED ORDER — FAMOTIDINE 20 MG PO TABS
20.0000 mg | ORAL_TABLET | Freq: Once | ORAL | Status: AC
  Administered 2014-07-29: 20 mg via ORAL
  Filled 2014-07-29: qty 1

## 2014-07-29 MED ORDER — ONDANSETRON 4 MG PO TBDP: 4.0000 mg | ORAL_TABLET | Freq: Three times a day (TID) | ORAL | Status: DC | PRN

## 2014-07-29 NOTE — ED Notes (Signed)
Pt reports having LLQ pain since this am. Reports diarrhea x 15 days and n/v this am. Denies fever.

## 2014-07-29 NOTE — Discharge Instructions (Signed)
Please read and follow all provided instructions.  Your diagnoses today include:  1. Nausea vomiting and diarrhea   2. Flank pain    Tests performed today include:  Blood counts and electrolytes  Blood tests to check liver and kidney function -- mildly high liver tests, should be rechecked by your doctor  Blood tests to check pancreas function  Urine test to look for infection and pregnancy (in women)  Vital signs. See below for your results today.   Medications prescribed:   Zofran (ondansetron) - for nausea and vomiting   Pepcid (famotidine) - antihistamine  You can find this medication over-the-counter.   DO NOT exceed:   20mg  Pepcid every 12 hours   Imodium - medication for diarrhea  Take any prescribed medications only as directed.  Home care instructions:   Follow any educational materials contained in this packet.   Your abdominal pain, nausea, vomiting, and diarrhea may be caused by a viral gastroenteritis also called 'stomach flu'. You should rest for the next several days. Keep drinking plenty of fluids and use the medicine for nausea as directed.    Drink clear liquids for the next 24 hours and introduce solid foods slowly after 24 hours using the b.r.a.t. diet (Bananas, Rice, Applesauce, Toast, Yogurt).    Follow-up instructions: Please follow-up with your primary care provider in the next 3 days for further evaluation of your symptoms. If you are not feeling better in 48 hours you may have a condition that is more serious and you need re-evaluation.   Return instructions:  SEEK IMMEDIATE MEDICAL ATTENTION IF:  If you have pain that does not go away or becomes severe   A temperature above 101F develops   Repeated vomiting occurs (multiple episodes)   If you have pain that becomes localized to portions of the abdomen. The right side could possibly be appendicitis. In an adult, the left lower portion of the abdomen could be colitis or diverticulitis.     Blood is being passed in stools or vomit (bright red or black tarry stools)   You develop chest pain, difficulty breathing, dizziness or fainting, or become confused, poorly responsive, or inconsolable (young children)  If you have any other emergent concerns regarding your health  Additional Information: Abdominal (belly) pain can be caused by many things. Your caregiver performed an examination and possibly ordered blood/urine tests and imaging (CT scan, x-rays, ultrasound). Many cases can be observed and treated at home after initial evaluation in the emergency department. Even though you are being discharged home, abdominal pain can be unpredictable. Therefore, you need a repeated exam if your pain does not resolve, returns, or worsens. Most patients with abdominal pain don't have to be admitted to the hospital or have surgery, but serious problems like appendicitis and gallbladder attacks can start out as nonspecific pain. Many abdominal conditions cannot be diagnosed in one visit, so follow-up evaluations are very important.  Your vital signs today were: BP 130/82 mmHg   Pulse 83   Temp(Src) 98.8 F (37.1 C) (Oral)   Resp 18   SpO2 99%   LMP 06/18/2014 If your blood pressure (bp) was elevated above 135/85 this visit, please have this repeated by your doctor within one month. --------------

## 2014-07-29 NOTE — ED Provider Notes (Signed)
CSN: [id number]     Arrival date & time 07/29/14  1232 History   First MD Initiated Contact with Patient 07/29/14 1631     Chief Complaint  Patient presents with  . Abdominal Pain  . Emesis  . Diarrhea     (Consider location/radiation/quality/duration/timing/severity/associated sxs/prior Treatment) HPI Comments: Patient with no past surgical history presents with complaint of diarrhea for 2 weeks, headache yesterday (resolved now), nausea/vomiting and left upper abdominal pain with radiation to the left lateral abdomen and left middle back starting this morning. No fever or URI symptoms. No chest pain or shortness of breath. No urinary symptoms including dysuria, hematuria, increased frequency. Patient is currently on her menstrual period. No vaginal discharge otherwise. No recent travel. No heavy NSAID use. No alcohol use. No treatments PTA. The onset of this condition was acute. The course is constant. Aggravating factors: none. Alleviating factors: none.    The history is provided by the patient. A language interpreter was used (brother-in-law at bedside).    Past Medical History  Diagnosis Date  . Hypertension in pregnancy, antepartum 12/11/2011    Borderline dx: no antenatal testing at this time (01/23/12)    Past Surgical History  Procedure Laterality Date  . No past surgeries     Family History  Problem Relation Age of Onset  . Hypertension Father   . Stroke Father   . Osteoporosis Mother    History  Substance Use Topics  . Smoking status: Never Smoker   . Smokeless tobacco: Never Used  . Alcohol Use: No   OB History    Gravida Para Term Preterm AB TAB SAB Ectopic Multiple Living   Review of Systems  Constitutional: Negative for fever.  HENT: Negative for rhinorrhea and sore throat.   Eyes: Negative for redness.  Respiratory: Negative for cough and shortness of breath.   Cardiovascular: Negative for chest pain.  Gastrointestinal: Positive for  nausea, vomiting, abdominal pain and diarrhea.  Genitourinary: Negative for dysuria.  Musculoskeletal: Negative for myalgias.  Skin: Negative for rash.  Neurological: Positive for headaches.      Allergies  Review of patient's allergies indicates no known allergies.  Home Medications   Prior to Admission medications   Medication Sig Start Date End Date Taking? Authorizing Provider  azithromycin (ZITHROMAX) 250 MG tablet Take 2 pills on day 1 then 1 pill daily until finished 07/11/14   Elenora Gamma, MD  norethindrone (MICRONOR,CAMILA,ERRIN) 0.35 MG tablet Take 1 tablet (0.35 mg total) by mouth daily. 10/20/13   Garnetta Buddy, MD  Prenatal Vit-Fe Fumarate-FA (PRENATAL MULTIVITAMIN) TABS Take 1 tablet by mouth daily.    Historical Provider, MD   BP 162/104 mmHg  Pulse 79  Temp(Src) 98.8 F (37.1 C) (Oral)  Resp 18  SpO2 99%  LMP 06/18/2014   Physical Exam  Constitutional: She appears well-developed and well-nourished.  HENT:  Head: Normocephalic and atraumatic.  Eyes: Conjunctivae are normal. Right eye exhibits no discharge. Left eye exhibits no discharge.  Neck: Normal range of motion. Neck supple.  Cardiovascular: Normal rate, regular rhythm and normal heart sounds.   No murmur heard. Pulmonary/Chest: Breath sounds normal. No respiratory distress. She has no wheezes. She has no rales.  Abdominal: Soft. There is tenderness in the epigastric area and left upper quadrant. There is no rigidity, no rebound, no guarding, no CVA tenderness, no tenderness at McBurney's point and negative Murphy's sign.  Musculoskeletal: She exhibits no edema or tenderness.  Neurological: She is alert.  Skin: Skin is warm and dry.  Psychiatric: She has a normal mood and affect.  Nursing note and vitals reviewed.   ED Course  Procedures (including critical care time) Labs Review Labs Reviewed  CBC WITH DIFFERENTIAL - Abnormal; Notable for the following:    Neutrophils Relative % 82  (*)    All other components within normal limits  COMPREHENSIVE METABOLIC PANEL - Abnormal; Notable for the following:    Glucose, Bld 131 (*)    AST 54 (*)    ALT 82 (*)    GFR calc non Af Amer 72 (*)    GFR calc Af Amer 84 (*)    All other components within normal limits  URINALYSIS, ROUTINE W REFLEX MICROSCOPIC - Abnormal; Notable for the following:    Color, Urine RED (*)    APPearance CLOUDY (*)    Hgb urine dipstick LARGE (*)    Protein, ur 30 (*)    Leukocytes, UA SMALL (*)    All other components within normal limits  URINE MICROSCOPIC-ADD ON - Abnormal; Notable for the following:    Squamous Epithelial / LPF FEW (*)    Bacteria, UA FEW (*)    All other components within normal limits  LIPASE, BLOOD  POC URINE PREG, ED    Imaging Review No results found.   EKG Interpretation None       4:44 PM Patient seen and examined. Work-up initiated. Medications ordered. Initial impression is gastritis/gastroenteritis. She does not have significant RUQ symptoms to suggest biliary or gallbladder etiology.   Vital signs reviewed and are as follows: BP 162/104 mmHg  Pulse 79  Temp(Src) 98.8 F (37.1 C) (Oral)  Resp 18  SpO2 99%  LMP 06/18/2014  Pt and family informed of mildly elevated liver enzymes that will need to be rechecked by PCP. Normal bili.   6:46 PM Patient re-examined. Her symptoms are resolved. Abdomen soft, non-tender. US is likely contaminated as patient is on her period. If she was continuing to have pain, would consider CT for stone, but with improvement and reassuring labs, feel that she can be discharged to home.   The patient was urged to return to the Emergency Department immediately with worsening of current symptoms, worsening abdominal pain, persistent vomiting, blood noted in stools, fever, or any other concerns. The patient verbalized understanding.   D/c with zofran for N/V, pepcid for epigastric discomfort, imodium for diarrhea.    MDM    Final diagnoses:  Nausea vomiting and diarrhea  Flank pain   Patient with recent N/V/D and epigastric pain most consistent with gastritis/gastroenteritis. Patient's symptoms are resolved with Zofran and Pepcid in emergency department. She is drinking fluids without vomiting. She is feeling much better. Abdomen is soft and nontender on reexam. Patient does not have right upper quadrant tenderness to suggest cholelithiasis/cholecystitis. Do not feel that an ultrasound would be helpful given her current exam. Patient has had mild transaminitis which is new. I recommended that she follow-up with her PCP in the next several weeks to have this rechecked. In addition, patient has had prolonged diarrhea without electrolyte abnormalities. She is encouraged to follow-up with her PCP if this is not improved in the next several days. Counseled on clear liquids for the next 24 hours. Patient seems reliable to return if she feels worse or symptoms change.Vitals are stable, no fever. No signs of dehydration, tolerating PO's. Lungs are clear. No focal  abdominal pain, no concern for appendicitis, cholecystitis, pancreatitis, ruptured viscus, UTI, kidney stone, or any other abdominal etiology. Supportive therapy indicated with return if symptoms worsen.      Renne Crigler, PA-C 07/29/14 1850  Elwin Mocha, MD 07/30/14 [phone]

## 2014-07-29 NOTE — ED Notes (Signed)
PA at the bedside.

## 2017-03-08 LAB — GLUCOSE, POCT (MANUAL RESULT ENTRY): POC Glucose: 187 mg/dl — AB (ref 70–99)

## 2017-03-08 LAB — POCT GLYCOSYLATED HEMOGLOBIN (HGB A1C): Hemoglobin A1C: 7.4

## 2017-04-08 ENCOUNTER — Encounter: Payer: Self-pay | Admitting: Pediatric Intensive Care

## 2017-04-08 NOTE — Congregational Nurse Program (Signed)
Congregational Nurse Program Note  Date of Encounter: 04/08/2017  Past Medical History: Past Medical History:  Diagnosis Date  . Hypertension in pregnancy, antepartum 12/11/2011   Borderline dx: no antenatal testing at this time (01/23/12)     Encounter Details:     CNP Questionnaire - 04/08/17 1500      Patient Demographics   Is this a new or existing patient? New   Patient is considered a/an Immigrant   Race Latino/Hispanic     Patient Assistance   Location of Patient Assistance [place]   Patient's financial/insurance status Self-Pay (Uninsured)   Uninsured Patient ([place] Card/Care Connects) Yes   Interventions Assisted patient in making appt.   Patient referred to apply for the following financial assistance [employer] insecurities addressed Not Applicable   Transportation assistance No   Assistance securing medications Yes   Type of Assistance Other   Educational health offerings Medications;Navigating the healthcare system;Nutrition     Encounter Details   Primary purpose of visit Education/Health Concerns;Navigating the Healthcare System   Was an Emergency Department visit averted? Not Applicable   Does patient have a medical provider? No   Patient referred to Clinic;Establish PCP   Was a mental health screening completed? (GAINS tool) No   Does patient have dental issues? No   Does patient have vision issues? No   Does your patient have an abnormal blood pressure today? Yes   Since previous encounter, have you referred patient for abnormal blood pressure that resulted in a new diagnosis or medication change? No   Does your patient have an abnormal blood glucose today? Yes   Since previous encounter, have you referred patient for abnormal blood glucose that resulted in a new diagnosis or medication change? No   Was there a life-saving intervention made? No    Via interpreter Glenford Peers- client was evaluated at [facility] fair a month  ago and was put on medication for BP/BG. Client states that she about 3 days medication remaining. She states that she has not had a PCP follow up. Client states she has "cut out sugar" from her meals. Requests assistance for PCP/medication.

## 2017-04-28 ENCOUNTER — Ambulatory Visit: Payer: Self-pay | Admitting: Family Medicine

## 2018-09-15 ENCOUNTER — Emergency Department (HOSPITAL_COMMUNITY): Payer: Self-pay

## 2018-09-15 ENCOUNTER — Encounter (HOSPITAL_COMMUNITY): Payer: Self-pay

## 2018-09-15 ENCOUNTER — Other Ambulatory Visit: Payer: Self-pay

## 2018-09-15 ENCOUNTER — Ambulatory Visit (HOSPITAL_COMMUNITY)
Admission: EM | Admit: 2018-09-15 | Discharge: 2018-09-15 | Disposition: A | Discharge status: Home / Self Care | Payer: Self-pay

## 2018-09-15 ENCOUNTER — Emergency Department (HOSPITAL_COMMUNITY)
Admission: EM | Admit: 2018-09-15 | Discharge: 2018-09-15 | Disposition: A | Discharge status: Home / Self Care | Payer: Self-pay | Attending: Emergency Medicine | Admitting: Emergency Medicine

## 2018-09-15 ENCOUNTER — Encounter (HOSPITAL_COMMUNITY): Payer: Self-pay | Admitting: Emergency Medicine

## 2018-09-15 DIAGNOSIS — E039 Hypothyroidism, unspecified: Secondary | ICD-10-CM | POA: Insufficient documentation

## 2018-09-15 DIAGNOSIS — R51 Headache: Secondary | ICD-10-CM

## 2018-09-15 DIAGNOSIS — R519 Headache, unspecified: Secondary | ICD-10-CM

## 2018-09-15 DIAGNOSIS — I159 Secondary hypertension, unspecified: Secondary | ICD-10-CM

## 2018-09-15 DIAGNOSIS — M542 Cervicalgia: Secondary | ICD-10-CM | POA: Insufficient documentation

## 2018-09-15 DIAGNOSIS — I10 Essential (primary) hypertension: Secondary | ICD-10-CM | POA: Insufficient documentation

## 2018-09-15 LAB — CBC
HCT: 41.7 % (ref 36.0–46.0)
Hemoglobin: 13.7 g/dL (ref 12.0–15.0)
MCH: 27.8 pg (ref 26.0–34.0)
MCHC: 32.9 g/dL (ref 30.0–36.0)
MCV: 84.6 fL (ref 80.0–100.0)
Platelets: 200 10*3/uL (ref 150–400)
RBC: 4.93 MIL/uL (ref 3.87–5.11)
RDW: 12 % (ref 11.5–15.5)
WBC: 7.1 10*3/uL (ref 4.0–10.5)
nRBC: 0 % (ref 0.0–0.2)

## 2018-09-15 LAB — BASIC METABOLIC PANEL
Anion gap: 11 (ref 5–15)
BUN: 13 mg/dL (ref 6–20)
CO2: 21 mmol/L — ABNORMAL LOW (ref 22–32)
Calcium: 9.2 mg/dL (ref 8.9–10.3)
Chloride: 104 mmol/L (ref 98–111)
Creatinine, Ser: 0.67 mg/dL (ref 0.44–1.00)
GFR calc Af Amer: >60 mL/min (ref >60)
GFR calc non Af Amer: >60 mL/min (ref >60)
Glucose, Bld: 212 mg/dL — ABNORMAL HIGH (ref 70–99)
Potassium: 3.7 mmol/L (ref 3.5–5.1)
Sodium: 136 mmol/L (ref 135–145)

## 2018-09-15 LAB — URINALYSIS, ROUTINE W REFLEX MICROSCOPIC
Bilirubin Urine: NEGATIVE
Glucose, UA: 50 mg/dL — AB
Hgb urine dipstick: NEGATIVE
Ketones, ur: 5 mg/dL — AB
Leukocytes,Ua: NEGATIVE
Nitrite: NEGATIVE
Protein, ur: 30 mg/dL — AB
Specific Gravity, Urine: 1.013 (ref 1.005–1.030)
pH: 8 (ref 5.0–8.0)

## 2018-09-15 LAB — HEPATIC FUNCTION PANEL
ALT: 53 U/L — ABNORMAL HIGH (ref 0–44)
AST: 37 U/L (ref 15–41)
Albumin: 4.1 g/dL (ref 3.5–5.0)
Alkaline Phosphatase: 79 U/L (ref 38–126)
Bilirubin, Direct: 0.1 mg/dL (ref 0.0–0.2)
Indirect Bilirubin: 0.6 mg/dL (ref 0.3–0.9)
Total Bilirubin: 0.7 mg/dL (ref 0.3–1.2)
Total Protein: 7.6 g/dL (ref 6.5–8.1)

## 2018-09-15 LAB — I-STAT TROPONIN, ED: Troponin i, poc: 0 ng/mL (ref 0.00–0.08)

## 2018-09-15 LAB — I-STAT BETA HCG BLOOD, ED (MC, WL, AP ONLY): I-stat hCG, quantitative: <5 m[IU]/mL (ref <5)

## 2018-09-15 MED ORDER — IOPAMIDOL (ISOVUE-370) INJECTION 76%
INTRAVENOUS | Status: AC
  Administered 2018-09-15: 17:00:00
  Filled 2018-09-15: qty 100

## 2018-09-15 MED ORDER — SODIUM CHLORIDE 0.9% FLUSH: 3.0000 mL | Freq: Once | INTRAVENOUS | Status: DC

## 2018-09-15 MED ORDER — METOCLOPRAMIDE HCL 5 MG/ML IJ SOLN
10.0000 mg | Freq: Once | INTRAMUSCULAR | Status: AC
  Administered 2018-09-15: 10 mg via INTRAVENOUS
  Filled 2018-09-15: qty 2

## 2018-09-15 MED ORDER — LACTATED RINGERS IV SOLN: INTRAVENOUS | Status: DC

## 2018-09-15 MED ORDER — AMLODIPINE BESYLATE 5 MG PO TABS: 5.0000 mg | ORAL_TABLET | Freq: Every day | ORAL | 1 refills | Status: DC

## 2018-09-15 MED ORDER — DIPHENHYDRAMINE HCL 50 MG/ML IJ SOLN
25.0000 mg | Freq: Once | INTRAMUSCULAR | Status: AC
  Administered 2018-09-15: 25 mg via INTRAVENOUS
  Filled 2018-09-15: qty 1

## 2018-09-15 NOTE — ED Provider Notes (Signed)
Tonya Simmons - [facility] EMERGENCY DEPARTMENT Provider Note   CSN: [id number] Arrival date & time: 09/15/18  1240    History   Chief Complaint Chief Complaint  Patient presents with  . Neck Pain  . Hypertension    HPI Tonya Simmons is a 39 y.o. female.     HPI Patient reports she had a quite sudden and severe headache start 4 days ago.  It started on Saturday.  She reports it was a really intense pain that then abated a bit and then came back again in a severe wave with throbbing.  She reports the pain is worse in the back of her head and comes up the side of her neck she indicates right greater than left.  Patient reports she has been nauseated and felt like vomiting.  She denies she is had any visual changes.  No loss of vision or double vision.  No weakness numbness or tingling of extremities.  Patient reports she has a history of hypertension but that was diagnosed 5 years ago during pregnancy.  She indicates that she has not been keeping track of her blood pressure in the interim time.  She reports last time she think she had a blood pressure check was probably 2 years ago.  She denies ever being on medications from the time of her pregnancy until now. Past Medical History:  Diagnosis Date  . Hypertension in pregnancy, antepartum 12/11/2011   Borderline dx: no antenatal testing at this time (01/23/12)     Patient Active Problem List   Diagnosis Date Noted  . CAP (community acquired pneumonia) 07/11/2014  . Elevated blood pressure (not hypertension) 07/11/2014  . Contraception 10/20/2013  . Tension type headache 07/14/2012  . Fatigue 06/16/2012  . History of hypothyroidism 12/11/2011  . Obesity 08/07/2011    Past Surgical History:  Procedure Laterality Date  . NO PAST SURGERIES       OB History    Gravida  3   Para  2   Term  2   Preterm      AB  1   Living  2     SAB  1   TAB      Ectopic      Multiple      Live Births  2            Home Medications    Prior to Admission medications   Medication Sig Start Date End Date Taking? Authorizing Provider  famotidine (PEPCID) 20 MG tablet Take 1 tablet (20 mg total) by mouth 2 (two) times daily. Patient not taking: Reported on 09/15/2018 07/29/14   Renne Crigler, PA-C  loperamide (IMODIUM) 2 MG capsule Take 1 capsule (2 mg total) by mouth 4 (four) times daily as needed for diarrhea or loose stools. Patient not taking: Reported on 09/15/2018 07/29/14   Renne Crigler, PA-C  ondansetron (ZOFRAN ODT) 4 MG disintegrating tablet Take 1 tablet (4 mg total) by mouth every 8 (eight) hours as needed for nausea or vomiting. Patient not taking: Reported on 09/15/2018 07/29/14   Renne Crigler, PA-C    Family History Family History  Problem Relation Age of Onset  . Hypertension Father   . Stroke Father   . Osteoporosis Mother     Social History Social History   Tobacco Use  . Smoking status: Never Smoker  . Smokeless tobacco: Never Used  Substance Use Topics  . Alcohol use: No  . Drug use: No     Allergies  Patient has no known allergies.   Review of Systems Review of Systems 10 Systems reviewed and are negative for acute change except as noted in the HPI.   Physical Exam Updated Vital Signs BP (!) 191/126 (BP Location: Right Arm)   Pulse 100   Temp 99.1 F (37.3 C) (Oral)   Resp 18   SpO2 97%   Physical Exam Constitutional:      Comments: Alert and appropriate.  Mental status clear.  Patient appears uncomfortable.  She is not somnolent.  Central obesity.  HENT:     Head: Normocephalic and atraumatic.     Nose: Nose normal.     Mouth/Throat:     Mouth: Mucous membranes are moist.     Pharynx: Oropharynx is clear.  Eyes:     Extraocular Movements: Extraocular movements intact.     Pupils: Pupils are equal, round, and reactive to light.  Neck:     Musculoskeletal: Neck supple.     Vascular: No carotid bruit.     Comments: No meningismus no  bruit Cardiovascular:     Rate and Rhythm: Normal rate and regular rhythm.     Heart sounds: No murmur.  Pulmonary:     Effort: Pulmonary effort is normal.     Breath sounds: Normal breath sounds.  Abdominal:     General: There is no distension.     Palpations: Abdomen is soft.     Tenderness: There is no abdominal tenderness. There is no guarding.  Musculoskeletal: Normal range of motion.        General: No swelling or tenderness.     Right lower leg: No edema.     Left lower leg: No edema.  Skin:    General: Skin is warm and dry.  Neurological:     General: No focal deficit present.     Mental Status: She is oriented to person, place, and time.     Cranial Nerves: No cranial nerve deficit.     Sensory: No sensory deficit.     Motor: No weakness.     Coordination: Coordination normal.     Comments: Mental status clear.  No somnolence.  Normal cognitive function.  Movements coordinated purposeful symmetric without difficulty.  Psychiatric:        Mood and Affect: Mood normal.      ED Treatments / Results  Labs (all labs ordered are listed, but only abnormal results are displayed) Labs Reviewed  BASIC METABOLIC PANEL - Abnormal; Notable for the following components:      Result Value   CO2 21 (*)    Glucose, Bld 212 (*)    All other components within normal limits  CBC  URINALYSIS, ROUTINE W REFLEX MICROSCOPIC  HEPATIC FUNCTION PANEL  I-STAT TROPONIN, ED  I-STAT BETA HCG BLOOD, ED ([facility], [facility], [facility] ONLY)    EKG EKG Interpretation  Date/Time:  Tuesday September 15 2018 13:10:37 EST Ventricular Rate:  98 PR Interval:  170 QRS Duration: 78 QT Interval:  360 QTC Calculation: 459 R Axis:   1 Text Interpretation:  Normal sinus rhythm Nonspecific T wave abnormality Abnormal ECG agree. no STEMI. no old comparison Confirmed by Arby Barrette [phone]) on 09/15/2018 3:51:47 PM   Radiology No results found.  Procedures Procedures (including critical care time)  Medications  Ordered in ED Medications  sodium chloride flush (NS) 0.9 % injection 3 mL (has no administration in time range)  metoCLOPramide (REGLAN) injection 10 mg (has no administration in time range)  diphenhydrAMINE (  BENADRYL) injection 25 mg (has no administration in time range)  lactated ringers infusion (has no administration in time range)     Initial Impression / Assessment and Plan / ED Course  I have reviewed the triage vital signs and the nursing notes.  Pertinent labs & imaging results that were available during my care of the patient were reviewed by me and considered in my medical decision making (see chart for details).       Due to quality and severity of pain, along with historical features CT angiogram obtained.  To follow-up by Dr. Lockie Mola  for final disposition.  Patient is alert and appropriate.  No neurologic deficits.   Final Clinical Impressions(s) / ED Diagnoses   Final diagnoses:  Neck pain  Secondary hypertension  Nonintractable headache, unspecified chronicity pattern, unspecified headache type    ED Discharge Orders    None       Arby Barrette, MD 09/27/18 1431

## 2018-09-15 NOTE — ED Triage Notes (Signed)
Per Hallie due to symptomatic hypertension, needs to be seen in ER. Pt agreeable to plan, left with family member.

## 2018-09-15 NOTE — ED Provider Notes (Signed)
Assumed care of patient for Dr. Clarice Pole at 4 PM.  Patient with headache, hypertension.  Headache has been ongoing for the last several days.  Has a history of hypertension but no longer on medications.  Has had headache and some right-sided neck pain as well.  Patient has normal neurological exam.  Thus far lab work is unremarkable including troponin, kidney function.  EKG reassuring.  Awaiting CT of the head and neck.  Patient getting headache cocktail.  Will reevaluate after lab work and imaging.  Okay to go home with work-up is unremarkable.  Dr. Clarice Pole recommends starting her on antihypertensive.  Upon my evaluation of the patient she states that headache has improved.  Neck pain also improved.  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  I called radiology to discuss these findings.  He states overall unremarkable finding.  No dissection, no bleeding.  Talked with neurology on the phone and they state that they are happy to follow-up outpatient with this patient for both headache treatment and for this possible aneurysm but they do recommend also talking with neurosurgery to see if they feel more comfortable following the aneurysm part.  Upon my evaluation the patient she has normal neurological exam.  She states that she has had bad right-sided neck pain for the last 4 days and had mild headache that started this morning, typical for her usual headhaches.  Was not sudden in onset, was not thunderclap.  Patient initially had high blood pressure in the 220 systolic however when cuff was changed to an appropriate size blood pressure was improved to 150/110.  Used to be on blood pressure medications and will start amlodipine after discussion with the patient.  Will give follow-up for primary care.  No concern for subarachnoid hemorrhage following images and history and physical. Talked with neurosurgery and states that  they will follow-up with the patient in the next few weeks to further evaluate this aneurysm.  But they are okay with discharge home.  Patient was given the information to follow-up with neurology for her chronic headaches, given information to follow-up with neurosurgery for possible aneurysm, given information to follow-up with primary care doctor.  Given a prescription for amlodipine for high blood pressure.  Discharged in good condition and given return precautions.   This chart was dictated using voice recognition software.  Despite best efforts to proofread,  errors can occur which can change the documentation meaning.     Virgina Norfolk, DO 09/15/18 1907

## 2018-09-15 NOTE — ED Triage Notes (Signed)
Pt reports left side neck pain and headache. Also reports HTN. Denies chest pain. Pt alert and oriented.

## 2018-09-15 NOTE — ED Notes (Signed)
Pt walked to bathroom to give urine sample, however pt obviously diaphoretic

## 2020-03-10 ENCOUNTER — Encounter (HOSPITAL_BASED_OUTPATIENT_CLINIC_OR_DEPARTMENT_OTHER): Payer: Self-pay | Admitting: *Deleted

## 2020-03-10 ENCOUNTER — Encounter (HOSPITAL_COMMUNITY): Payer: Self-pay

## 2020-03-10 ENCOUNTER — Other Ambulatory Visit: Payer: Self-pay

## 2020-03-10 ENCOUNTER — Ambulatory Visit (HOSPITAL_COMMUNITY)
Admission: EM | Admit: 2020-03-10 | Discharge: 2020-03-10 | Disposition: A | Discharge status: Home / Self Care | Payer: Self-pay | Attending: Emergency Medicine | Admitting: Emergency Medicine

## 2020-03-10 ENCOUNTER — Emergency Department (HOSPITAL_BASED_OUTPATIENT_CLINIC_OR_DEPARTMENT_OTHER)
Admission: EM | Admit: 2020-03-10 | Discharge: 2020-03-10 | Disposition: A | Discharge status: Home / Self Care | Payer: Self-pay | Attending: Emergency Medicine | Admitting: Emergency Medicine

## 2020-03-10 ENCOUNTER — Emergency Department (HOSPITAL_BASED_OUTPATIENT_CLINIC_OR_DEPARTMENT_OTHER): Payer: Self-pay

## 2020-03-10 DIAGNOSIS — E669 Obesity, unspecified: Secondary | ICD-10-CM | POA: Insufficient documentation

## 2020-03-10 DIAGNOSIS — R519 Headache, unspecified: Secondary | ICD-10-CM | POA: Insufficient documentation

## 2020-03-10 DIAGNOSIS — I10 Essential (primary) hypertension: Secondary | ICD-10-CM

## 2020-03-10 DIAGNOSIS — E1165 Type 2 diabetes mellitus with hyperglycemia: Secondary | ICD-10-CM | POA: Insufficient documentation

## 2020-03-10 DIAGNOSIS — I160 Hypertensive urgency: Secondary | ICD-10-CM | POA: Insufficient documentation

## 2020-03-10 DIAGNOSIS — R1110 Vomiting, unspecified: Secondary | ICD-10-CM

## 2020-03-10 DIAGNOSIS — Z3202 Encounter for pregnancy test, result negative: Secondary | ICD-10-CM

## 2020-03-10 DIAGNOSIS — Z20822 Contact with and (suspected) exposure to covid-19: Secondary | ICD-10-CM | POA: Insufficient documentation

## 2020-03-10 DIAGNOSIS — R739 Hyperglycemia, unspecified: Secondary | ICD-10-CM

## 2020-03-10 DIAGNOSIS — G43809 Other migraine, not intractable, without status migrainosus: Secondary | ICD-10-CM | POA: Insufficient documentation

## 2020-03-10 DIAGNOSIS — Z79899 Other long term (current) drug therapy: Secondary | ICD-10-CM | POA: Insufficient documentation

## 2020-03-10 DIAGNOSIS — E1129 Type 2 diabetes mellitus with other diabetic kidney complication: Secondary | ICD-10-CM | POA: Insufficient documentation

## 2020-03-10 DIAGNOSIS — M79602 Pain in left arm: Secondary | ICD-10-CM | POA: Insufficient documentation

## 2020-03-10 DIAGNOSIS — Z8249 Family history of ischemic heart disease and other diseases of the circulatory system: Secondary | ICD-10-CM | POA: Insufficient documentation

## 2020-03-10 DIAGNOSIS — E039 Hypothyroidism, unspecified: Secondary | ICD-10-CM | POA: Insufficient documentation

## 2020-03-10 DIAGNOSIS — R809 Proteinuria, unspecified: Secondary | ICD-10-CM | POA: Insufficient documentation

## 2020-03-10 LAB — COMPREHENSIVE METABOLIC PANEL
ALT: 94 U/L — ABNORMAL HIGH (ref 0–44)
ALT: 90 U/L — ABNORMAL HIGH (ref 0–44)
AST: 84 U/L — ABNORMAL HIGH (ref 15–41)
AST: 77 U/L — ABNORMAL HIGH (ref 15–41)
Albumin: 4.2 g/dL (ref 3.5–5.0)
Albumin: 4.2 g/dL (ref 3.5–5.0)
Alkaline Phosphatase: 101 U/L (ref 38–126)
Alkaline Phosphatase: 87 U/L (ref 38–126)
Anion gap: 12 (ref 5–15)
Anion gap: 12 (ref 5–15)
BUN: 11 mg/dL (ref 6–20)
BUN: 14 mg/dL (ref 6–20)
CO2: 23 mmol/L (ref 22–32)
CO2: 24 mmol/L (ref 22–32)
Calcium: 9.1 mg/dL (ref 8.9–10.3)
Calcium: 9.3 mg/dL (ref 8.9–10.3)
Chloride: 98 mmol/L (ref 98–111)
Chloride: 98 mmol/L (ref 98–111)
Creatinine, Ser: 0.83 mg/dL (ref 0.44–1.00)
Creatinine, Ser: 0.9 mg/dL (ref 0.44–1.00)
GFR calc Af Amer: >60 mL/min (ref >60)
GFR calc Af Amer: >60 mL/min (ref >60)
GFR calc non Af Amer: >60 mL/min (ref >60)
GFR calc non Af Amer: >60 mL/min (ref >60)
Glucose, Bld: 304 mg/dL — ABNORMAL HIGH (ref 70–99)
Glucose, Bld: 333 mg/dL — ABNORMAL HIGH (ref 70–99)
Potassium: 3.4 mmol/L — ABNORMAL LOW (ref 3.5–5.1)
Potassium: 3.1 mmol/L — ABNORMAL LOW (ref 3.5–5.1)
Sodium: 133 mmol/L — ABNORMAL LOW (ref 135–145)
Sodium: 134 mmol/L — ABNORMAL LOW (ref 135–145)
Total Bilirubin: 0.5 mg/dL (ref 0.3–1.2)
Total Bilirubin: 0.5 mg/dL (ref 0.3–1.2)
Total Protein: 7.7 g/dL (ref 6.5–8.1)
Total Protein: 7.5 g/dL (ref 6.5–8.1)

## 2020-03-10 LAB — CBC
HCT: 42.9 % (ref 36.0–46.0)
HCT: 40.5 % (ref 36.0–46.0)
Hemoglobin: 14.4 g/dL (ref 12.0–15.0)
Hemoglobin: 13.9 g/dL (ref 12.0–15.0)
MCH: 27.7 pg (ref 26.0–34.0)
MCH: 28.3 pg (ref 26.0–34.0)
MCHC: 33.6 g/dL (ref 30.0–36.0)
MCHC: 34.3 g/dL (ref 30.0–36.0)
MCV: 82.7 fL (ref 80.0–100.0)
MCV: 82.3 fL (ref 80.0–100.0)
Platelets: 202 10*3/uL (ref 150–400)
Platelets: 201 10*3/uL (ref 150–400)
RBC: 5.19 MIL/uL — ABNORMAL HIGH (ref 3.87–5.11)
RBC: 4.92 MIL/uL (ref 3.87–5.11)
RDW: 12.3 % (ref 11.5–15.5)
RDW: 12.4 % (ref 11.5–15.5)
WBC: 4.6 10*3/uL (ref 4.0–10.5)
WBC: 4.5 10*3/uL (ref 4.0–10.5)
nRBC: 0 % (ref 0.0–0.2)
nRBC: 0 % (ref 0.0–0.2)

## 2020-03-10 LAB — URINALYSIS, ROUTINE W REFLEX MICROSCOPIC
Bilirubin Urine: NEGATIVE
Glucose, UA: >=500 mg/dL — AB
Ketones, ur: NEGATIVE mg/dL
Leukocytes,Ua: NEGATIVE
Nitrite: NEGATIVE
Protein, ur: 100 mg/dL — AB
Specific Gravity, Urine: 1.02 (ref 1.005–1.030)
pH: 6 (ref 5.0–8.0)

## 2020-03-10 LAB — URINALYSIS, MICROSCOPIC (REFLEX)

## 2020-03-10 LAB — POCT URINALYSIS DIPSTICK, ED / UC
Bilirubin Urine: NEGATIVE
Glucose, UA: 500 mg/dL — AB
Ketones, ur: NEGATIVE mg/dL
Leukocytes,Ua: NEGATIVE
Nitrite: NEGATIVE
Protein, ur: >=300 mg/dL — AB
Specific Gravity, Urine: 1.025 (ref 1.005–1.030)
Urobilinogen, UA: 2 mg/dL — ABNORMAL HIGH (ref 0.0–1.0)
pH: 6 (ref 5.0–8.0)

## 2020-03-10 LAB — DIFFERENTIAL
Abs Immature Granulocytes: 0.01 10*3/uL (ref 0.00–0.07)
Basophils Absolute: 0 10*3/uL (ref 0.0–0.1)
Basophils Relative: 0 %
Eosinophils Absolute: 0.1 10*3/uL (ref 0.0–0.5)
Eosinophils Relative: 2 %
Immature Granulocytes: 0 %
Lymphocytes Relative: 31 %
Lymphs Abs: 1.4 10*3/uL (ref 0.7–4.0)
Monocytes Absolute: 0.5 10*3/uL (ref 0.1–1.0)
Monocytes Relative: 11 %
Neutro Abs: 2.5 10*3/uL (ref 1.7–7.7)
Neutrophils Relative %: 56 %

## 2020-03-10 LAB — SARS CORONAVIRUS 2 BY RT PCR (HOSPITAL ORDER, PERFORMED IN ~~LOC~~ HOSPITAL LAB): SARS Coronavirus 2: NEGATIVE

## 2020-03-10 LAB — ETHANOL: Alcohol, Ethyl (B): <10 mg/dL (ref <10)

## 2020-03-10 LAB — PROTIME-INR
INR: 1 (ref 0.8–1.2)
Prothrombin Time: 12.5 seconds (ref 11.4–15.2)

## 2020-03-10 LAB — RAPID URINE DRUG SCREEN, HOSP PERFORMED
Amphetamines: NOT DETECTED
Barbiturates: NOT DETECTED
Benzodiazepines: NOT DETECTED
Cocaine: NOT DETECTED
Opiates: NOT DETECTED
Tetrahydrocannabinol: NOT DETECTED

## 2020-03-10 LAB — HCG, SERUM, QUALITATIVE: Preg, Serum: NEGATIVE

## 2020-03-10 LAB — CBG MONITORING, ED: Glucose-Capillary: 292 mg/dL — ABNORMAL HIGH (ref 70–99)

## 2020-03-10 LAB — POC URINE PREG, ED: Preg Test, Ur: NEGATIVE

## 2020-03-10 LAB — APTT: aPTT: 24 seconds (ref 24–36)

## 2020-03-10 MED ORDER — SODIUM CHLORIDE 0.9 % IV BOLUS
1000.0000 mL | Freq: Once | INTRAVENOUS | Status: AC
  Administered 2020-03-10: 1000 mL via INTRAVENOUS

## 2020-03-10 MED ORDER — KETOROLAC TROMETHAMINE 30 MG/ML IJ SOLN
INTRAMUSCULAR | Status: AC
  Filled 2020-03-10: qty 1

## 2020-03-10 MED ORDER — METOCLOPRAMIDE HCL 5 MG/ML IJ SOLN
INTRAMUSCULAR | Status: AC
  Filled 2020-03-10: qty 2

## 2020-03-10 MED ORDER — METOCLOPRAMIDE HCL 5 MG/ML IJ SOLN
5.0000 mg | Freq: Once | INTRAMUSCULAR | Status: AC
  Administered 2020-03-10: 5 mg via INTRAMUSCULAR

## 2020-03-10 MED ORDER — KETOROLAC TROMETHAMINE 30 MG/ML IJ SOLN
30.0000 mg | Freq: Once | INTRAMUSCULAR | Status: AC
  Administered 2020-03-10: 30 mg via INTRAMUSCULAR

## 2020-03-10 MED ORDER — PROCHLORPERAZINE EDISYLATE 10 MG/2ML IJ SOLN
10.0000 mg | Freq: Once | INTRAMUSCULAR | Status: AC
  Administered 2020-03-10: 10 mg via INTRAVENOUS
  Filled 2020-03-10: qty 2

## 2020-03-10 MED ORDER — DIPHENHYDRAMINE HCL 50 MG/ML IJ SOLN
25.0000 mg | Freq: Once | INTRAMUSCULAR | Status: AC
  Administered 2020-03-10: 25 mg via INTRAVENOUS
  Filled 2020-03-10: qty 1

## 2020-03-10 MED ORDER — LABETALOL HCL 5 MG/ML IV SOLN
10.0000 mg | Freq: Once | INTRAVENOUS | Status: AC
  Administered 2020-03-10: 10 mg via INTRAVENOUS
  Filled 2020-03-10: qty 4

## 2020-03-10 NOTE — ED Triage Notes (Signed)
Pt sent here from UC for eval DX DM , Hypertension

## 2020-03-10 NOTE — ED Provider Notes (Signed)
[facility]    CSN: [id number] Arrival date & time: 03/10/20  4098      History   Chief Complaint Chief Complaint  Patient presents with  . Headache  . Medication Refill  . Vomiting  . Hand Pain    HPI Tonya Simmons is a 39 y.o. female.   Tonya Simmons presents with complaints of headache, as well vomiting. She feels that her headache causes vomiting. Started three days ago. Has not vomited today. Feels nausea. Has had similar headaches in the past but hasn't lasted this long. Has taken tylenol and advil for pain which hasn't helped, hasn't taken today, last at 0200 this am. No chest pain or shortness of breath . History of hypertension while pregnant, last pregnancy was 6 years ago. She is out of her hypertension medications, doesn't have a PCP.  Has been out for 2 years. Has felt some diaphoresis with symptoms. No specific triggers or exacerbating factors. Can feel worse in the morning when getting her kids ready for school, can feel suddenly sweaty, without increased nausea. Sun/ bright light increases her headache. Decreased vision to left eye for the past two months, no double vision. Bad headache last about 4 months ago. She went to the ER due to bad headache 09/2018. She feels like this current headache is different, moves to different locations of her head. No neck pain. No other URI symptoms. No known ill contacts.    Spanish audio interpreter used to collect history and physical exam.    ROS per HPI, negative if not otherwise mentioned.      Past Medical History:  Diagnosis Date  . Hypertension in pregnancy, antepartum 12/11/2011   Borderline dx: no antenatal testing at this time (01/23/12)     Patient Active Problem List   Diagnosis Date Noted  . CAP (community acquired pneumonia) 07/11/2014  . Elevated blood pressure (not hypertension) 07/11/2014  . Contraception 10/20/2013  . Tension type headache 07/14/2012  . Fatigue 06/16/2012   . History of hypothyroidism 12/11/2011  . Obesity 08/07/2011    Past Surgical History:  Procedure Laterality Date  . NO PAST SURGERIES      OB History    Gravida  3   Para  2   Term  2   Preterm      AB  1   Living  2     SAB  1   TAB      Ectopic      Multiple      Live Births  2            Home Medications    Prior to Admission medications   Medication Sig Start Date End Date Taking? Authorizing Provider  amLODipine (NORVASC) 5 MG tablet Take 1 tablet (5 mg total) by mouth daily for 30 days. 09/15/18 10/15/18  Curatolo, Adam, DO  famotidine (PEPCID) 20 MG tablet Take 1 tablet (20 mg total) by mouth 2 (two) times daily. Patient not taking: Reported on 09/15/2018 07/29/14   Renne Crigler, PA-C  loperamide (IMODIUM) 2 MG capsule Take 1 capsule (2 mg total) by mouth 4 (four) times daily as needed for diarrhea or loose stools. Patient not taking: Reported on 09/15/2018 07/29/14   Renne Crigler, PA-C  ondansetron (ZOFRAN ODT) 4 MG disintegrating tablet Take 1 tablet (4 mg total) by mouth every 8 (eight) hours as needed for nausea or vomiting. Patient not taking: Reported on 09/15/2018 07/29/14   Renne Crigler, PA-C  Family History Family History  Problem Relation Age of Onset  . Hypertension Father   . Stroke Father   . Osteoporosis Mother     Social History Social History   Tobacco Use  . Smoking status: Never Smoker  . Smokeless tobacco: Never Used  Substance Use Topics  . Alcohol use: No  . Drug use: No     Allergies   Patient has no known allergies.   Review of Systems Review of Systems   Physical Exam Triage Vital Signs ED Triage Vitals  Enc Vitals Group     BP 03/10/20 1037 (!) 216/132     Pulse Rate 03/10/20 1037 94     Resp 03/10/20 1037 18     Temp 03/10/20 1037 98.7 F (37.1 C)     Temp Source 03/10/20 1037 Oral     SpO2 03/10/20 1037 98 %     Weight 03/10/20 1036 248 lb 12.8 oz (112.9 kg)     Height --      Head  Circumference --      Peak Flow --      Pain Score 03/10/20 1036 10     Pain Loc --      Pain Edu? --      Excl. in GC? --    No data found.  Updated Vital Signs BP (!) 192/129 (BP Location: Right Arm)   Pulse 94   Temp 98.7 F (37.1 C) (Oral)   Resp 18   Wt 248 lb 12.8 oz (112.9 kg)   SpO2 98%   Breastfeeding No   BMI 47.01 kg/m   Visual Acuity Right Eye Distance:   Left Eye Distance:   Bilateral Distance:    Right Eye Near:   Left Eye Near:    Bilateral Near:     Physical Exam Constitutional:      General: She is not in acute distress.    Appearance: She is well-developed. She is obese. She is not ill-appearing.  Cardiovascular:     Rate and Rhythm: Normal rate.  Pulmonary:     Effort: Pulmonary effort is normal.     Breath sounds: Normal breath sounds.  Skin:    General: Skin is warm and dry.  Neurological:     Mental Status: She is alert and oriented to person, place, and time.     Cranial Nerves: No cranial nerve deficit.     Sensory: No sensory deficit.     Motor: No weakness.     Coordination: Coordination normal.    EKG:  NSR rate of 88 . Previous EKG was available for review. No stwave changes as interpreted by me.    UC Treatments / Results  Labs (all labs ordered are listed, but only abnormal results are displayed) Labs Reviewed  CBC - Abnormal; Notable for the following components:      Result Value   RBC 5.19 (*)    All other components within normal limits  COMPREHENSIVE METABOLIC PANEL - Abnormal; Notable for the following components:   Sodium 133 (*)    Potassium 3.4 (*)    Glucose, Bld 304 (*)    AST 84 (*)    ALT 94 (*)    All other components within normal limits  POCT URINALYSIS DIPSTICK, ED / UC - Abnormal; Notable for the following components:   Glucose, UA 500 (*)    Hgb urine dipstick TRACE (*)    Protein, ur >=300 (*)    Urobilinogen, UA 2.0 (*)  All other components within normal limits  CBG MONITORING, ED - Abnormal;  Notable for the following components:   Glucose-Capillary 292 (*)    All other components within normal limits  SARS CORONAVIRUS 2 (TAT 6-24 HRS)  POC URINE PREG, ED    EKG   Radiology No results found.  Procedures Procedures (including critical care time)  Medications Ordered in UC Medications  ketorolac (TORADOL) 30 MG/ML injection 30 mg (30 mg Intramuscular Given 03/10/20 1134)  metoCLOPramide (REGLAN) injection 5 mg (5 mg Intramuscular Given 03/10/20 1134)    Initial Impression / Assessment and Plan / UC Course  I have reviewed the triage vital signs and the nursing notes.  Pertinent labs & imaging results that were available during my care of the patient were reviewed by me and considered in my medical decision making (see chart for details).  Clinical Course as of Mar 10 1242  Fri Mar 10, 2020  1222 BP 192/129, headache has improved     [NB]    Clinical Course User Index [NB] Linus Mako B, NP    Significantly elevated BP. BP causing headache or headache causing elevation in BP. Headache improved with migraine cocktail of toradol and reglan here today but mi nimal improvement of BP. New onset diabetes per labs today, no obvious dka, normal anion gap.  ekg without changes. With elevation of blood sugar which is new as well as elevation in bp recommend further treatment in the ER at this time, patient family will transport her now. Patient verbalized understanding and agreeable to plan.   Final Clinical Impressions(s) / UC Diagnoses   Final diagnoses:  Hypertension, unspecified type  Hyperglycemia  Other migraine without status migrainosus, not intractable  Proteinuria, unspecified type     Discharge Instructions     Your blood sugar is elevated, as is your blood pressure, despite the medications to help your headache.  I feel you need further evaluation and management in the ER.   Su nivel de azcar en sangre est elevado, al igual que su presin arterial,  a pesar de los medicamentos para aliviar su dolor de [place]. Siento que necesita ms evaluacin y [employer] sala de Sports administrator.    ED Prescriptions    None     PDMP not reviewed this encounter.   Georgetta Haber, NP 03/10/20 1246

## 2020-03-10 NOTE — ED Triage Notes (Addendum)
Pt is here with headache, vomiting with left hand pain that started 2 days ago, Pt states she has been out of her Amlodipine for months, pt has not taken any meds to relieve discomfort.

## 2020-03-10 NOTE — ED Provider Notes (Signed)
[facility]PT [facility] Emergency Department Provider Note MRN:  [id number]  Arrival date & time: 03/10/20     Chief Complaint   Hyperglycemia and Hypertension   History of Present Illness   Tonya Simmons is a 39 y.o. year-old female with a history of gestational hypertension presenting to the ED with chief complaint of hypertension.  Patient evaluated by urgent care doctor and sent here for blood pressure concerns.  Patient has been having on and off headaches for 4 months, but had a much worse headache that started 3 to 4 days ago.  Gradual onset, progressively worsening.  Located throughout the entire head.  Denies any nausea or vomiting, no vision change.  3 days ago patient also noticed some decreased sensation to the left arm and has also been dropping objects with the left hand more often than normal for the past 3 days.  Denies any numbness or weakness to the legs, no balance issues, no trouble with swallowing.  Review of Systems  A complete 10 system review of systems was obtained and all systems are negative except as noted in the HPI and PMH.   Patient's Health History    Past Medical History:  Diagnosis Date  . Hypertension in pregnancy, antepartum 12/11/2011   Borderline dx: no antenatal testing at this time (01/23/12)     Past Surgical History:  Procedure Laterality Date  . NO PAST SURGERIES      Family History  Problem Relation Age of Onset  . Hypertension Father   . Stroke Father   . Osteoporosis Mother     Social History   Socioeconomic History  . Marital status: Significant Other    Spouse name: Not on file  . Number of children: Not on file  . Years of education: Not on file  . Highest education level: Not on file  Occupational History  . Not on file  Tobacco Use  . Smoking status: Never Smoker  . Smokeless tobacco: Never Used  Substance and Sexual Activity  . Alcohol use: No  . Drug use: No  . Sexual activity: Yes     Birth control/protection: None  Other Topics Concern  . Not on file  Social History Narrative  . Not on file   Social Determinants of Health   Financial Resource Strain:   . Difficulty of Paying Living Expenses: Not on file  Food Insecurity:   . Worried About Programme researcher, broadcasting/film/video in the Last Year: Not on file  . Ran Out of Food in the Last Year: Not on file  Transportation Needs:   . Lack of Transportation (Medical): Not on file  . Lack of Transportation (Non-Medical): Not on file  Physical Activity:   . Days of Exercise per Week: Not on file  . Minutes of Exercise per Session: Not on file  Stress:   . Feeling of Stress : Not on file  Social Connections:   . Frequency of Communication with Friends and Family: Not on file  . Frequency of Social Gatherings with Friends and Family: Not on file  . Attends Religious Services: Not on file  . Active Member of Clubs or Organizations: Not on file  . Attends Banker Meetings: Not on file  . Marital Status: Not on file  Intimate Partner Violence:   . Fear of Current or Ex-Partner: Not on file  . Emotionally Abused: Not on file  . Physically Abused: Not on file  . Sexually Abused: Not on file  Physical Exam   Vitals:   03/10/20 1600 03/10/20 1630  BP: (!) 179/124 (!) 184/113  Pulse: 90 81  Resp: 19 20  Temp:    SpO2: 99% 99%    CONSTITUTIONAL: Well-appearing, NAD NEURO:  Alert and oriented x 3, normal and symmetric strength, and coordination, subjective decreased sensation to the left arm, normal speech, no neglect EYES:  eyes equal and reactive, normal extraocular movements, no visual field cuts ENT/NECK:  no LAD, no JVD CARDIO: Regular rate, well-perfused, normal S1 and S2 PULM:  CTAB no wheezing or rhonchi GI/GU:  normal bowel sounds, non-distended, non-tender MSK/SPINE:  No gross deformities, no edema SKIN:  no rash, atraumatic PSYCH:  Appropriate speech and behavior  *Additional and/or pertinent  findings included in MDM below  Diagnostic and Interventional Summary    EKG Interpretation  Date/Time:  Friday March 10 2020 16:28:00 EDT Ventricular Rate:  84 PR Interval:    QRS Duration: 99 QT Interval:  388 QTC Calculation: 459 R Axis:   7 Text Interpretation: Sinus rhythm Abnormal R-wave progression, early transition Borderline T abnormalities, anterior leads Confirmed by Kennis Carina [phone]) on 03/10/2020 5:31:23 PM      Labs Reviewed  COMPREHENSIVE METABOLIC PANEL - Abnormal; Notable for the following components:      Result Value   Sodium 134 (*)    Potassium 3.1 (*)    Glucose, Bld 333 (*)    AST 77 (*)    ALT 90 (*)    All other components within normal limits  URINALYSIS, ROUTINE W REFLEX MICROSCOPIC - Abnormal; Notable for the following components:   Glucose, UA >=500 (*)    Hgb urine dipstick TRACE (*)    Protein, ur 100 (*)    All other components within normal limits  URINALYSIS, MICROSCOPIC (REFLEX) - Abnormal; Notable for the following components:   Bacteria, UA MANY (*)    All other components within normal limits  ETHANOL  PROTIME-INR  APTT  CBC  DIFFERENTIAL  RAPID URINE DRUG SCREEN, HOSP PERFORMED  HCG, SERUM, QUALITATIVE    CT HEAD WO CONTRAST  Final Result    MR BRAIN WO CONTRAST    (Results Pending)    Medications  labetalol (NORMODYNE) injection 10 mg (10 mg Intravenous Given 03/10/20 1637)  sodium chloride 0.9 % bolus 1,000 mL (1,000 mLs Intravenous New Bag/Given 03/10/20 1630)  diphenhydrAMINE (BENADRYL) injection 25 mg (25 mg Intravenous Given 03/10/20 1630)  prochlorperazine (COMPAZINE) injection 10 mg (10 mg Intravenous Given 03/10/20 1633)     Procedures  /  Critical Care .Critical Care Performed by: Sabas Sous, MD Authorized by: Sabas Sous, MD   Critical care provider statement:    Critical care time (minutes):  45   Critical care was necessary to treat or prevent imminent or life-threatening deterioration of the  following conditions: Hypertensive urgency, concern for possible acute ischemic stroke.   Critical care was time spent personally by me on the following activities:  Discussions with consultants, evaluation of patient's response to treatment, examination of patient, ordering and performing treatments and interventions, ordering and review of laboratory studies, ordering and review of radiographic studies, pulse oximetry, re-evaluation of patient's condition, obtaining history from patient or surrogate and review of old charts    ED Course and Medical Decision Making  I have reviewed the triage vital signs, the nursing notes, and pertinent available records from the EMR.  Listed above are laboratory and imaging tests that I personally ordered, reviewed, and interpreted and  then considered in my medical decision making (see below for details).  Concern for hypertensive urgency versus acute ischemic stroke versus hemorrhagic stroke related to uncontrolled hypertension.  Blood pressure 230/140, providing IV labetalol, migraine cocktail, obtaining CT head, basic labs.  Outside of the window for any intervention at this time, suspect need for admission and/or transfer for MRI and blood pressure control.     Work-up here is overall reassuring, given patient's left arm symptoms and blood pressure she will still need MRI to exclude acute ischemic stroke.  Discussed with Dr. Hyacinth Meeker, who accepts the patient for an ED to ED transfer for MRI at [facility].  Patient's blood pressures are better controlled after the single dose of labetalol.  I recommended transport via ambulance, however patient refuses this due to cost concerns.  She agrees to have a friend or family drive her directly to [facility].  She is aware of the risks of transport via private vehicle and she expresses understanding that she could have suffered a stroke and needs to be evaluated further at [facility].  Overall, I feel that if her MRI is  normal and her blood pressures are adequately controlled she may be a candidate for discharge with close follow-up.  Possibly starting an antihypertensive.  If her blood pressures continue to be significantly elevated and/or her MRI is abnormal, this would warrant admission.  Elmer Sow. Pilar Plate, MD [facility] Emergency Medicine [facility] [email or web address]  Final Clinical Impressions(s) / ED Diagnoses     ICD-10-CM   1. Hypertensive urgency  I16.0     ED Discharge Orders    None       Discharge Instructions Discussed with and Provided to Patient:   Discharge Instructions   None       Sabas Sous, MD 03/10/20 [phone]

## 2020-03-10 NOTE — Discharge Instructions (Signed)
Your blood sugar is elevated, as is your blood pressure, despite the medications to help your headache.  I feel you need further evaluation and management in the ER.   Su nivel de azcar en sangre est elevado, al igual que su presin arterial, a pesar de los medicamentos para aliviar su dolor de [place]. Siento que necesita ms evaluacin y [employer] sala de Sports administrator.

## 2020-03-10 NOTE — Discharge Instructions (Addendum)
You are being transferred to [facility] for an MRI.  Please go directly to [facility] emergency department for continued care.

## 2020-03-11 LAB — SARS CORONAVIRUS 2 (TAT 6-24 HRS): SARS Coronavirus 2: NEGATIVE

## 2020-03-11 NOTE — ED Notes (Signed)
Patient placement called and stated patient never showed up at [facility] ED.

## 2020-04-12 ENCOUNTER — Inpatient Hospital Stay (HOSPITAL_COMMUNITY): Payer: HRSA Program

## 2020-04-12 ENCOUNTER — Emergency Department (HOSPITAL_COMMUNITY): Payer: HRSA Program

## 2020-04-12 ENCOUNTER — Inpatient Hospital Stay (HOSPITAL_COMMUNITY)
Admission: EM | Admit: 2020-04-12 | Discharge: 2020-04-18 | DRG: 177 | Disposition: A | Discharge status: Home Health | Payer: HRSA Program | Attending: Internal Medicine | Admitting: Internal Medicine

## 2020-04-12 ENCOUNTER — Encounter (HOSPITAL_COMMUNITY): Payer: Self-pay

## 2020-04-12 ENCOUNTER — Other Ambulatory Visit: Payer: Self-pay

## 2020-04-12 DIAGNOSIS — R6339 Other feeding difficulties: Secondary | ICD-10-CM | POA: Diagnosis present

## 2020-04-12 DIAGNOSIS — B370 Candidal stomatitis: Secondary | ICD-10-CM | POA: Diagnosis present

## 2020-04-12 DIAGNOSIS — M542 Cervicalgia: Secondary | ICD-10-CM | POA: Diagnosis present

## 2020-04-12 DIAGNOSIS — E1310 Other specified diabetes mellitus with ketoacidosis without coma: Secondary | ICD-10-CM

## 2020-04-12 DIAGNOSIS — N179 Acute kidney failure, unspecified: Secondary | ICD-10-CM | POA: Diagnosis present

## 2020-04-12 DIAGNOSIS — B9620 Unspecified Escherichia coli [E. coli] as the cause of diseases classified elsewhere: Secondary | ICD-10-CM | POA: Diagnosis present

## 2020-04-12 DIAGNOSIS — E1110 Type 2 diabetes mellitus with ketoacidosis without coma: Secondary | ICD-10-CM | POA: Diagnosis present

## 2020-04-12 DIAGNOSIS — A4151 Sepsis due to Escherichia coli [E. coli]: Secondary | ICD-10-CM | POA: Diagnosis not present

## 2020-04-12 DIAGNOSIS — Z8249 Family history of ischemic heart disease and other diseases of the circulatory system: Secondary | ICD-10-CM

## 2020-04-12 DIAGNOSIS — E876 Hypokalemia: Secondary | ICD-10-CM | POA: Diagnosis present

## 2020-04-12 DIAGNOSIS — U071 COVID-19: Secondary | ICD-10-CM | POA: Diagnosis present

## 2020-04-12 DIAGNOSIS — J9601 Acute respiratory failure with hypoxia: Secondary | ICD-10-CM | POA: Diagnosis present

## 2020-04-12 DIAGNOSIS — E873 Alkalosis: Secondary | ICD-10-CM | POA: Diagnosis present

## 2020-04-12 DIAGNOSIS — Z79899 Other long term (current) drug therapy: Secondary | ICD-10-CM

## 2020-04-12 DIAGNOSIS — J1282 Pneumonia due to coronavirus disease 2019: Secondary | ICD-10-CM | POA: Diagnosis present

## 2020-04-12 DIAGNOSIS — J982 Interstitial emphysema: Secondary | ICD-10-CM | POA: Diagnosis present

## 2020-04-12 DIAGNOSIS — I10 Essential (primary) hypertension: Secondary | ICD-10-CM | POA: Diagnosis present

## 2020-04-12 DIAGNOSIS — D6859 Other primary thrombophilia: Secondary | ICD-10-CM | POA: Diagnosis present

## 2020-04-12 DIAGNOSIS — Z9114 Patient's other noncompliance with medication regimen: Secondary | ICD-10-CM

## 2020-04-12 DIAGNOSIS — R61 Generalized hyperhidrosis: Secondary | ICD-10-CM | POA: Diagnosis present

## 2020-04-12 DIAGNOSIS — N12 Tubulo-interstitial nephritis, not specified as acute or chronic: Secondary | ICD-10-CM | POA: Diagnosis present

## 2020-04-12 DIAGNOSIS — I82441 Acute embolism and thrombosis of right tibial vein: Secondary | ICD-10-CM | POA: Diagnosis present

## 2020-04-12 DIAGNOSIS — E860 Dehydration: Secondary | ICD-10-CM | POA: Diagnosis present

## 2020-04-12 DIAGNOSIS — B3781 Candidal esophagitis: Secondary | ICD-10-CM | POA: Diagnosis present

## 2020-04-12 DIAGNOSIS — R7989 Other specified abnormal findings of blood chemistry: Secondary | ICD-10-CM | POA: Diagnosis not present

## 2020-04-12 DIAGNOSIS — R7881 Bacteremia: Secondary | ICD-10-CM | POA: Diagnosis present

## 2020-04-12 DIAGNOSIS — Z823 Family history of stroke: Secondary | ICD-10-CM

## 2020-04-12 HISTORY — DX: Type 2 diabetes mellitus without complications: E11.9

## 2020-04-12 LAB — CBC WITH DIFFERENTIAL/PLATELET
Abs Immature Granulocytes: 0.3 10*3/uL — ABNORMAL HIGH (ref 0.00–0.07)
Basophils Absolute: 0 10*3/uL (ref 0.0–0.1)
Basophils Relative: 0 %
Eosinophils Absolute: 0 10*3/uL (ref 0.0–0.5)
Eosinophils Relative: 0 %
HCT: 38.6 % (ref 36.0–46.0)
Hemoglobin: 13 g/dL (ref 12.0–15.0)
Immature Granulocytes: 2 %
Lymphocytes Relative: 5 %
Lymphs Abs: 0.7 10*3/uL (ref 0.7–4.0)
MCH: 27.8 pg (ref 26.0–34.0)
MCHC: 33.7 g/dL (ref 30.0–36.0)
MCV: 82.5 fL (ref 80.0–100.0)
Monocytes Absolute: 0.5 10*3/uL (ref 0.1–1.0)
Monocytes Relative: 4 %
Neutro Abs: 11.2 10*3/uL — ABNORMAL HIGH (ref 1.7–7.7)
Neutrophils Relative %: 89 %
Platelets: 255 10*3/uL (ref 150–400)
RBC: 4.68 MIL/uL (ref 3.87–5.11)
RDW: 12.5 % (ref 11.5–15.5)
WBC: 12.7 10*3/uL — ABNORMAL HIGH (ref 4.0–10.5)
nRBC: 0 % (ref 0.0–0.2)

## 2020-04-12 LAB — I-STAT ARTERIAL BLOOD GAS, ED
Acid-base deficit: 4 mmol/L — ABNORMAL HIGH (ref 0.0–2.0)
Bicarbonate: 17.2 mmol/L — ABNORMAL LOW (ref 20.0–28.0)
Calcium, Ion: 1.18 mmol/L (ref 1.15–1.40)
HCT: 38 % (ref 36.0–46.0)
Hemoglobin: 12.9 g/dL (ref 12.0–15.0)
O2 Saturation: 89 %
Patient temperature: 99
Potassium: 2.5 mmol/L — CL (ref 3.5–5.1)
Sodium: 139 mmol/L (ref 135–145)
TCO2: 18 mmol/L — ABNORMAL LOW (ref 22–32)
pCO2 arterial: 23.1 mmHg — ABNORMAL LOW (ref 32.0–48.0)
pH, Arterial: 7.481 — ABNORMAL HIGH (ref 7.350–7.450)
pO2, Arterial: 51 mmHg — ABNORMAL LOW (ref 83.0–108.0)

## 2020-04-12 LAB — CBG MONITORING, ED
Glucose-Capillary: 496 mg/dL — ABNORMAL HIGH (ref 70–99)
Glucose-Capillary: 500 mg/dL — ABNORMAL HIGH (ref 70–99)
Glucose-Capillary: 354 mg/dL — ABNORMAL HIGH (ref 70–99)
Glucose-Capillary: 414 mg/dL — ABNORMAL HIGH (ref 70–99)
Glucose-Capillary: 314 mg/dL — ABNORMAL HIGH (ref 70–99)
Glucose-Capillary: 220 mg/dL — ABNORMAL HIGH (ref 70–99)
Glucose-Capillary: 189 mg/dL — ABNORMAL HIGH (ref 70–99)

## 2020-04-12 LAB — BETA-HYDROXYBUTYRIC ACID: Beta-Hydroxybutyric Acid: 4 mmol/L — ABNORMAL HIGH (ref 0.05–0.27)

## 2020-04-12 LAB — BASIC METABOLIC PANEL
Anion gap: 15 (ref 5–15)
BUN: 14 mg/dL (ref 6–20)
CO2: 20 mmol/L — ABNORMAL LOW (ref 22–32)
Calcium: 8.9 mg/dL (ref 8.9–10.3)
Chloride: 104 mmol/L (ref 98–111)
Creatinine, Ser: 1.14 mg/dL — ABNORMAL HIGH (ref 0.44–1.00)
GFR calc Af Amer: >60 mL/min (ref >60)
GFR calc non Af Amer: >60 mL/min (ref >60)
Glucose, Bld: 242 mg/dL — ABNORMAL HIGH (ref 70–99)
Potassium: 2.8 mmol/L — ABNORMAL LOW (ref 3.5–5.1)
Sodium: 139 mmol/L (ref 135–145)

## 2020-04-12 LAB — RESPIRATORY PANEL BY RT PCR (FLU A&B, COVID)
Influenza A by PCR: NEGATIVE
Influenza B by PCR: NEGATIVE
SARS Coronavirus 2 by RT PCR: POSITIVE — AB

## 2020-04-12 LAB — FERRITIN: Ferritin: 258 ng/mL (ref 11–307)

## 2020-04-12 LAB — COMPREHENSIVE METABOLIC PANEL
ALT: 30 U/L (ref 0–44)
AST: 34 U/L (ref 15–41)
Albumin: 2.5 g/dL — ABNORMAL LOW (ref 3.5–5.0)
Alkaline Phosphatase: 116 U/L (ref 38–126)
Anion gap: 18 — ABNORMAL HIGH (ref 5–15)
BUN: 13 mg/dL (ref 6–20)
CO2: 17 mmol/L — ABNORMAL LOW (ref 22–32)
Calcium: 8.6 mg/dL — ABNORMAL LOW (ref 8.9–10.3)
Chloride: 95 mmol/L — ABNORMAL LOW (ref 98–111)
Creatinine, Ser: 1.22 mg/dL — ABNORMAL HIGH (ref 0.44–1.00)
GFR calc Af Amer: >60 mL/min (ref >60)
GFR calc non Af Amer: 56 mL/min — ABNORMAL LOW (ref >60)
Glucose, Bld: 563 mg/dL (ref 70–99)
Potassium: 3 mmol/L — ABNORMAL LOW (ref 3.5–5.1)
Sodium: 130 mmol/L — ABNORMAL LOW (ref 135–145)
Total Bilirubin: 1.3 mg/dL — ABNORMAL HIGH (ref 0.3–1.2)
Total Protein: 7.1 g/dL (ref 6.5–8.1)

## 2020-04-12 LAB — I-STAT BETA HCG BLOOD, ED (MC, WL, AP ONLY): I-stat hCG, quantitative: <5 m[IU]/mL (ref <5)

## 2020-04-12 LAB — HEMOGLOBIN A1C
Hgb A1c MFr Bld: 12 % — ABNORMAL HIGH (ref 4.8–5.6)
Mean Plasma Glucose: 297.7 mg/dL

## 2020-04-12 LAB — TRIGLYCERIDES: Triglycerides: 183 mg/dL — ABNORMAL HIGH (ref <150)

## 2020-04-12 LAB — C-REACTIVE PROTEIN: CRP: 17 mg/dL — ABNORMAL HIGH (ref <1.0)

## 2020-04-12 LAB — MAGNESIUM: Magnesium: 2.1 mg/dL (ref 1.7–2.4)

## 2020-04-12 LAB — LACTATE DEHYDROGENASE: LDH: 437 U/L — ABNORMAL HIGH (ref 98–192)

## 2020-04-12 LAB — PROCALCITONIN: Procalcitonin: <0.1 ng/mL

## 2020-04-12 LAB — FIBRINOGEN: Fibrinogen: 680 mg/dL — ABNORMAL HIGH (ref 210–475)

## 2020-04-12 LAB — D-DIMER, QUANTITATIVE: D-Dimer, Quant: >20 ug/mL-FEU — ABNORMAL HIGH (ref 0.00–0.50)

## 2020-04-12 MED ORDER — POTASSIUM CHLORIDE CRYS ER 20 MEQ PO TBCR
40.0000 meq | EXTENDED_RELEASE_TABLET | ORAL | Status: AC
  Administered 2020-04-13 (×2): 40 meq via ORAL
  Filled 2020-04-12 (×2): qty 2

## 2020-04-12 MED ORDER — POTASSIUM CHLORIDE 10 MEQ/100ML IV SOLN: 10.0000 meq | INTRAVENOUS | Status: DC

## 2020-04-12 MED ORDER — INSULIN REGULAR(HUMAN) IN NACL 100-0.9 UT/100ML-% IV SOLN: INTRAVENOUS | Status: DC

## 2020-04-12 MED ORDER — LACTATED RINGERS IV SOLN: INTRAVENOUS | Status: DC

## 2020-04-12 MED ORDER — LACTATED RINGERS IV BOLUS
20.0000 mL/kg | Freq: Once | INTRAVENOUS | Status: AC
  Administered 2020-04-12: 2232 mL via INTRAVENOUS

## 2020-04-12 MED ORDER — ENOXAPARIN SODIUM 40 MG/0.4ML ~~LOC~~ SOLN: 40.0000 mg | Freq: Every day | SUBCUTANEOUS | Status: DC

## 2020-04-12 MED ORDER — SODIUM CHLORIDE 0.9% FLUSH
3.0000 mL | Freq: Two times a day (BID) | INTRAVENOUS | Status: DC
  Administered 2020-04-13 – 2020-04-18 (×11): 3 mL via INTRAVENOUS

## 2020-04-12 MED ORDER — POTASSIUM CHLORIDE CRYS ER 20 MEQ PO TBCR: 40.0000 meq | EXTENDED_RELEASE_TABLET | Freq: Once | ORAL | Status: DC

## 2020-04-12 MED ORDER — ENOXAPARIN SODIUM 60 MG/0.6ML ~~LOC~~ SOLN: 55.0000 mg | Freq: Every day | SUBCUTANEOUS | Status: DC

## 2020-04-12 MED ORDER — GUAIFENESIN-DM 100-10 MG/5ML PO SYRP
5.0000 mL | ORAL_SOLUTION | ORAL | Status: DC | PRN
  Administered 2020-04-13 – 2020-04-14 (×3): 5 mL via ORAL
  Filled 2020-04-12 (×2): qty 5

## 2020-04-12 MED ORDER — METHYLPREDNISOLONE SODIUM SUCC 40 MG IJ SOLR
40.0000 mg | Freq: Once | INTRAMUSCULAR | Status: AC
  Administered 2020-04-13: 40 mg via INTRAVENOUS
  Filled 2020-04-12: qty 1

## 2020-04-12 MED ORDER — DEXTROSE 50 % IV SOLN: 0.0000 mL | INTRAVENOUS | Status: DC | PRN

## 2020-04-12 MED ORDER — SODIUM CHLORIDE 0.9 % IV SOLN
200.0000 mg | Freq: Once | INTRAVENOUS | Status: AC
  Administered 2020-04-12: 200 mg via INTRAVENOUS
  Filled 2020-04-12: qty 40

## 2020-04-12 MED ORDER — POTASSIUM CHLORIDE 10 MEQ/100ML IV SOLN
10.0000 meq | INTRAVENOUS | Status: AC
  Administered 2020-04-12 (×4): 10 meq via INTRAVENOUS
  Filled 2020-04-12 (×3): qty 100

## 2020-04-12 MED ORDER — DEXTROSE IN LACTATED RINGERS 5 % IV SOLN: INTRAVENOUS | Status: DC

## 2020-04-12 MED ORDER — ENOXAPARIN SODIUM 60 MG/0.6ML ~~LOC~~ SOLN
55.0000 mg | Freq: Every day | SUBCUTANEOUS | Status: DC
  Administered 2020-04-13: 55 mg via SUBCUTANEOUS
  Filled 2020-04-12: qty 0.55

## 2020-04-12 MED ORDER — DEXAMETHASONE SODIUM PHOSPHATE 10 MG/ML IJ SOLN
6.0000 mg | Freq: Every day | INTRAMUSCULAR | Status: DC
  Administered 2020-04-13 – 2020-04-18 (×6): 6 mg via INTRAVENOUS
  Filled 2020-04-12 (×6): qty 1

## 2020-04-12 MED ORDER — SODIUM CHLORIDE 0.9 % IV SOLN
100.0000 mg | Freq: Every day | INTRAVENOUS | Status: AC
  Administered 2020-04-13 – 2020-04-16 (×4): 100 mg via INTRAVENOUS
  Filled 2020-04-12 (×5): qty 20

## 2020-04-12 MED ORDER — FLUCONAZOLE IN SODIUM CHLORIDE 400-0.9 MG/200ML-% IV SOLN
400.0000 mg | INTRAVENOUS | Status: DC
  Administered 2020-04-12 – 2020-04-15 (×3): 400 mg via INTRAVENOUS
  Filled 2020-04-12 (×7): qty 200

## 2020-04-12 MED ORDER — BARICITINIB 2 MG PO TABS
4.0000 mg | ORAL_TABLET | Freq: Every day | ORAL | Status: DC
  Administered 2020-04-13 – 2020-04-14 (×3): 4 mg via ORAL
  Filled 2020-04-12 (×3): qty 2

## 2020-04-12 MED ORDER — ACETAMINOPHEN 500 MG PO TABS
1000.0000 mg | ORAL_TABLET | Freq: Once | ORAL | Status: AC
  Administered 2020-04-12: 1000 mg via ORAL
  Filled 2020-04-12: qty 2

## 2020-04-12 MED ORDER — SODIUM CHLORIDE 0.9 % IV BOLUS
1000.0000 mL | Freq: Once | INTRAVENOUS | Status: AC
  Administered 2020-04-12: 1000 mL via INTRAVENOUS

## 2020-04-12 MED ORDER — INSULIN REGULAR(HUMAN) IN NACL 100-0.9 UT/100ML-% IV SOLN
INTRAVENOUS | Status: DC
  Administered 2020-04-12: 6.5 [IU]/h via INTRAVENOUS
  Administered 2020-04-13: 3.6 [IU]/h via INTRAVENOUS
  Filled 2020-04-12 (×2): qty 100

## 2020-04-12 NOTE — H&P (Addendum)
Date: 04/12/2020               Patient Name:  Tonya Simmons MRN: [id number]  DOB: [birth year] Age / Sex: 39 y.o., female   PCP: Patient, No Pcp Per         Medical Service: Internal Medicine Teaching Service         Attending Physician: Dr. Aldine Contes, MD    First Contact: Charleen Kirks, MD, Albertine Grates Pager: [phone])  Second Contact: Aldine Contes Pager: [phone]       After Hours (After 5p/  First Contact Pager: [phone]  weekends / holidays): Second Contact Pager: [phone]   Chief Complaint: Fever  History of Present Illness: Tonya Simmons is a 39 yo F w/ PMH of diabetes and hypertension presenting with fever and cough. She is Spanish speaking only and her history was obtained via assistance of a video interpreter. She was noted to have difficulty answering questions citing her throat pain and often says 'I don't know' to most questions. She was in her usual state of health until 4 days ago when she began to have shortness of breath especially when she exerts herself. She mentions it started without obvious inciting event and denies any sick contact but states she is unvaccinated against COVID-19. She is endorsing frequent coughs but denies any fevers.  She mentions that she does not have a PCP due to being uninsured and does not regularly take medicines at home. She was recently seen in ED for symptomatic hypertension and hyperglycemia but left without completing full work-up due to concerns regarding medical cost.  On review of systems, she endorse headaches, nausea, dry cough, fevers. Denies any productive sputum, vomiting, diarrhea, chest pain, palpitations, focal weakness.  ED Course:   Lab Orders     Respiratory Panel by RT PCR (Flu A&B, Covid) - Nasopharyngeal Swab     Blood Culture (routine x 2)     CBC WITH DIFFERENTIAL     Comprehensive metabolic panel     Procalcitonin     Lactate dehydrogenase     Ferritin     Fibrinogen      C-reactive protein     Triglycerides     Beta-hydroxybutyric acid     Blood gas, arterial     HIV Antibody (routine testing w rflx)     D-dimer, quantitative (not at [facility])     C-reactive protein     D-dimer, quantitative (not at [facility])     Basic metabolic panel     Hemoglobin A1c     Magnesium     Urinalysis, Routine w reflex microscopic     I-Stat beta hCG blood, ED     I-Stat beta hCG blood, ED     CBG monitoring, ED     CBG monitoring, ED     CBG monitoring, ED     CBG monitoring, ED     CBG monitoring, ED     I-Stat arterial blood gas, ED     CBG monitoring, ED   Meds:  Amlodipine 50m daily  Social:  Social History   Tobacco Use  . Smoking status: Never Smoker  . Smokeless tobacco: Never Used  Substance Use Topics  . Alcohol use: No  . Drug use: No   Family History: Stroke in father. No other clinically relevant family history  Allergies: Allergies as of 04/12/2020  . (No Known Allergies)   Past Medical History:  Diagnosis Date  . Diabetes mellitus without complication ([place]   .  Hypertension in pregnancy, antepartum 12/11/2011   Borderline dx: no antenatal testing at this time (01/23/12)    Review of Systems: A complete ROS was negative except as per HPI.   Physical Exam: Blood pressure 110/77, pulse (!) 112, temperature 99 F (37.2 C), temperature source Oral, resp. rate (!) 41, height _0  (1.6 m), weight 111.6 kg, SpO2 91 %.  Gen: Obese, ill-appearing, in distress HEENT: NCAT head, hearing intact, EOMI, PERRL, MMM, + oral thrush Neck: supple, ROM intact, no JVD, no cervical adenopathy CV: RRR, S1, S2 normal, No rubs, no murmurs, no gallops Pulm: + stridor, tachypneic, bilateral crackles, no wheezes Abd: Soft, BS+, NTND, No rebound, no guarding Extm: ROM intact, Peripheral pulses intact, No peripheral edema Skin: Diaphoretic, poor turgor Neuro: AAOx2, appear agitated  Labs: CBC    Component Value Date/Time   WBC 12.7 (H) 04/12/2020 1528   RBC  4.68 04/12/2020 1528   HGB 12.9 04/12/2020 2010   HCT 38.0 04/12/2020 2010   PLT 255 04/12/2020 1528   MCV 82.5 04/12/2020 1528   MCH 27.8 04/12/2020 1528   MCHC 33.7 04/12/2020 1528   RDW 12.5 04/12/2020 1528   LYMPHSABS 0.7 04/12/2020 1528   MONOABS 0.5 04/12/2020 1528   EOSABS 0.0 04/12/2020 1528   BASOSABS 0.0 04/12/2020 1528    CMP     Component Value Date/Time   NA 139 04/12/2020 2010   K 2.5 (LL) 04/12/2020 2010   CL 95 (L) 04/12/2020 1528   CO2 17 (L) 04/12/2020 1528   GLUCOSE 563 (HH) 04/12/2020 1528   GLUCOSE 83 06/07/2013 1031   BUN 13 04/12/2020 1528   CREATININE 1.22 (H) 04/12/2020 1528   CREATININE 0.56 08/04/2013 1640   CALCIUM 8.6 (L) 04/12/2020 1528   PROT 7.1 04/12/2020 1528   ALBUMIN 2.5 (L) 04/12/2020 1528   AST 34 04/12/2020 1528   ALT 30 04/12/2020 1528   ALKPHOS 116 04/12/2020 1528   BILITOT 1.3 (H) 04/12/2020 1528   GFRNONAA 56 (L) 04/12/2020 1528   GFRNONAA >89 06/03/2013 1224   GFRAA >60 04/12/2020 1528   GFRAA >89 06/03/2013 1224   Imaging: DG Chest Port 1 View  Result Date: 04/12/2020 CLINICAL DATA:  Shortness of breath. EXAM: PORTABLE CHEST 1 VIEW COMPARISON:  September 15, 2018. FINDINGS: The heart size and mediastinal contours are within normal limits. No pneumothorax or effusion is noted. Patchy airspace opacities are noted in both lungs, left greater than right, consistent multifocal pneumonia. The visualized skeletal structures are unremarkable. IMPRESSION: Bilateral multifocal pneumonia, left greater than right. Electronically Signed   By: Marijo Conception M.D.   On: 04/12/2020 14:41   EKG: personally reviewed my interpretation is sinus tachycardia, no ischemic changes  Assessment & Plan by Problem: Active Problems:   DKA (diabetic ketoacidosis) ([place])  Tonya Simmons is a 39 y.o. with pertinent PMH of T2DM, HTN  who presented with fever and admit for DKA and COVID pneumonia  #Acute hypoxic respiratory failure 2/2 COVID  pneumonia #Respiratory alkalosis w/ concurrent anion gap metabolic acidosis Present w/ acute hypoxic respiratory failure, currently satting 92 on 5L oxygen. Inflammatory markers elevated at CRP 17,  LDH 437. X-ray w/ multi-focal opacities. She is Unvaccinated. Found to be COVID +. Need admission for treatment for COVID pneumonia. Current respiratory rate 42 w/ high risk for decompensation considering concurrent DKA. PCCM consulted but recommend medical floor admission. Received solumedrol in ED. Abg: pH 7.48, pCO2 23.1, pO2 51, Bicarb 17.2. Delta pH 0.08, delta pCo2 17 consistent  w/ acute respiratory alkalosis w/ concurrent gap met acidosis 2/2 DKA. Meets criteria for baricitinib in setting of worsening oxygen requirement since presentation and significantly elevated CRP. - Appreciate PCCM recs - Start baricitinib - Start remdesivir (day 1/5) - Start dexamethasone (day 1/10) - Trend CMP, inflammatory markers - O2 as need to keep >88 - Early ambulation, proning as tolerated, incentive spirometry - SSI while on steroids  #Diabetes Ketoacidosis Presented w/ malaise, feeling ill. Noted to have cbg of 563 with CO2 of 17, Anion gap of 18. Beta-hydroxybutyrate elevated at 4. Meets diagnostic criteria for DKA. Likely went into DKA due to COVID infection. Started on insulin gtt in ED. Not on any diabetic meds at home due to lack of coverage. Chart review shows previous episodes of hyperglycemia. Treatment will be complicated by con-current COVID pneumonia. Will likely need higher rate of insulin drip. K 3.0 - Admit to progressive - C/w insulin drip - S/p IV Kcl 748mq in ED. Additional IV Kcl ordered - Normal saline at 150 mL/hr until CBG less than 250 - Switch to D5-1/2 NS when 1 CBG less than 250 - NPO  - BMET every 4 hours, CBG Q1H - Once anion gap closed 2, start CM diet and if able to eat, administer Lantus 33 units - Continue insulin drip for 1-2 more hours, then discontinue and start mealtime  novolog 10 units qc w/ sliding scale - DC fluids if eating, drinking, and off insulin drip  #Odynophagia 2/2 esophageal candidasis Complaining of neck pain and odynophagia. Evidence of thrush on oral mucosa. Considering hx of untreated diabetes, appear to be due to esophageal candidasis. Will treat w/ fluconazole and f/u imaging. - Start fluconazole 4068mdaily for 7-14 days - F/u CT chest, neck  #Acute Kidney Injury 2/2 dehydration Appear significantly dehydrated on exam. Likely due to DKA. Baseline renal fx Bun 14, Creatinine 0.9. Admit creatinine elevated at 1.22. Should improve with fluids - Fluid resuscitation as above - Avoid nephrotoxic meds - Trend renal fx  #Hypertension On amlodipine 48m64maily at home. Current bp at 110/77 - Hold home anti-hypertensives for now  Diet: NPO VTE: Enoxaparin IVF: NS,125cc/hr Code: Full  Prior to Admission Living Arrangement: Home, living at home Anticipated Discharge Location: Home Barriers to Discharge: Medical treatment  Dispo: Admit patient to Inpatient with expected length of stay greater than 2 midnights.  Signed: LeeMosetta AnisD 04/12/2020, 9:47 PM  Pager: [phone]

## 2020-04-12 NOTE — ED Triage Notes (Signed)
Patient complains of 4 days of fever, cough, extreme weakness and no appetite. Patient diaphoretic on arrival and pale. Unvaccinated, alert and oriented

## 2020-04-12 NOTE — Progress Notes (Signed)
PT seen and examined  39 y/o female that presents with 4 days of SOB, fever and cough. When the pt talks she sounds like she has stridor but currently when listening to the neck she has good air movement.  No distress.    Pt is safe to go to the medical floor. Will continue to follow as needed.

## 2020-04-12 NOTE — ED Notes (Signed)
Patient transported to CT 

## 2020-04-12 NOTE — ED Provider Notes (Signed)
[facility] EMERGENCY DEPARTMENT Provider Note   CSN: [id number] Arrival date & time: 04/12/20  1306     History No chief complaint on file.   Tonya Simmons is a 39 y.o. female.  Patient c/o 3-4 day hx fever and cough. Symptoms acute onset, mod-severe, constant, persistent, felt worse today. Cough is non productive. No sore throat or runny nose. Mild sob. No chest pain. No abd pain or vomiting/diarrhea. No specific known contact with covid + patient. Patient has not received covid vaccine.   The history is provided by the patient. A language interpreter was used.       Past Medical History:  Diagnosis Date  . Diabetes mellitus without complication (HCC)   . Hypertension in pregnancy, antepartum 12/11/2011   Borderline dx: no antenatal testing at this time (01/23/12)     Patient Active Problem List   Diagnosis Date Noted  . CAP (community acquired pneumonia) 07/11/2014  . Elevated blood pressure (not hypertension) 07/11/2014  . Contraception 10/20/2013  . Tension type headache 07/14/2012  . Fatigue 06/16/2012  . History of hypothyroidism 12/11/2011  . Obesity 08/07/2011    Past Surgical History:  Procedure Laterality Date  . NO PAST SURGERIES       OB History    Gravida  3   Para  2   Term  2   Preterm      AB  1   Living  2     SAB  1   TAB      Ectopic      Multiple      Live Births  2           Family History  Problem Relation Age of Onset  . Hypertension Father   . Stroke Father   . Osteoporosis Mother     Social History   Tobacco Use  . Smoking status: Never Smoker  . Smokeless tobacco: Never Used  Substance Use Topics  . Alcohol use: No  . Drug use: No    Home Medications Prior to Admission medications   Medication Sig Start Date End Date Taking? Authorizing Provider  amLODipine (NORVASC) 5 MG tablet Take 1 tablet (5 mg total) by mouth daily for 30 days. 09/15/18 10/15/18  Curatolo, Adam, DO    famotidine (PEPCID) 20 MG tablet Take 1 tablet (20 mg total) by mouth 2 (two) times daily. Patient not taking: Reported on 09/15/2018 07/29/14   Renne Crigler, PA-C  loperamide (IMODIUM) 2 MG capsule Take 1 capsule (2 mg total) by mouth 4 (four) times daily as needed for diarrhea or loose stools. Patient not taking: Reported on 09/15/2018 07/29/14   Renne Crigler, PA-C  ondansetron (ZOFRAN ODT) 4 MG disintegrating tablet Take 1 tablet (4 mg total) by mouth every 8 (eight) hours as needed for nausea or vomiting. Patient not taking: Reported on 09/15/2018 07/29/14   Renne Crigler, PA-C    Allergies    Patient has no known allergies.  Review of Systems   Review of Systems  Constitutional: Positive for fever.  HENT: Negative for sore throat.   Eyes: Negative for redness.  Respiratory: Positive for cough and shortness of breath.   Cardiovascular: Negative for chest pain.  Gastrointestinal: Negative for abdominal pain and vomiting.  Genitourinary: Negative for flank pain.  Musculoskeletal: Positive for myalgias. Negative for back pain and neck pain.  Skin: Negative for rash.  Neurological: Negative for headaches.  Hematological: Does not bruise/bleed easily.  Psychiatric/Behavioral: Negative for confusion.  Physical Exam Updated Vital Signs BP 113/75   Pulse (!) 140   Temp 99 F (37.2 C) (Oral)   Resp 20   SpO2 (!) 84%   Physical Exam Vitals and nursing note reviewed.  Constitutional:      Appearance: Normal appearance. She is well-developed.  HENT:     Head: Atraumatic.     Nose: Nose normal.     Mouth/Throat:     Mouth: Mucous membranes are moist.  Eyes:     General: No scleral icterus.    Conjunctiva/sclera: Conjunctivae normal.  Neck:     Trachea: No tracheal deviation.  Cardiovascular:     Rate and Rhythm: Tachycardia present. Rhythm irregular.     Pulses: Normal pulses.     Heart sounds: Normal heart sounds. No murmur heard.  No friction rub. No gallop.    Pulmonary:     Effort: Pulmonary effort is normal. No respiratory distress.     Breath sounds: Normal breath sounds.     Comments: Non productive cough, upper resp congestion.  Abdominal:     General: Bowel sounds are normal. There is no distension.     Palpations: Abdomen is soft.     Tenderness: There is no abdominal tenderness. There is no guarding.  Genitourinary:    Comments: No cva tenderness.  Musculoskeletal:        General: No swelling or tenderness.     Cervical back: Normal range of motion and neck supple. No rigidity. No muscular tenderness.     Right lower leg: No edema.     Left lower leg: No edema.  Skin:    General: Skin is warm and dry.     Findings: No rash.  Neurological:     Mental Status: She is alert.     Comments: Alert, speech normal.   Psychiatric:        Mood and Affect: Mood normal.     ED Results / Procedures / Treatments   Labs (all labs ordered are listed, but only abnormal results are displayed) Results for orders placed or performed during the hospital encounter of 04/12/20  CBC WITH DIFFERENTIAL  Result Value Ref Range   WBC 12.7 (H) 4.0 - 10.5 K/uL   RBC 4.68 3.87 - 5.11 MIL/uL   Hemoglobin 13.0 12.0 - 15.0 g/dL   HCT 00.1 36 - 46 %   MCV 82.5 80.0 - 100.0 fL   MCH 27.8 26.0 - 34.0 pg   MCHC 33.7 30.0 - 36.0 g/dL   RDW 74.9 44.9 - 67.5 %   Platelets 255 150 - 400 K/uL   nRBC 0.0 0.0 - 0.2 %   Neutrophils Relative % 89 %   Neutro Abs 11.2 (H) 1.7 - 7.7 K/uL   Lymphocytes Relative 5 %   Lymphs Abs 0.7 0.7 - 4.0 K/uL   Monocytes Relative 4 %   Monocytes Absolute 0.5 0 - 1 K/uL   Eosinophils Relative 0 %   Eosinophils Absolute 0.0 0 - 0 K/uL   Basophils Relative 0 %   Basophils Absolute 0.0 0 - 0 K/uL   Immature Granulocytes 2 %   Abs Immature Granulocytes 0.30 (H) 0.00 - 0.07 K/uL  Fibrinogen  Result Value Ref Range   Fibrinogen 680 (H) 210 - 475 mg/dL  I-Stat beta hCG blood, ED  Result Value Ref Range   I-stat hCG,  quantitative <5.0 <5 mIU/mL   Comment 3            EKG  EKG Interpretation  Date/Time:  Wednesday April 12 2020 14:18:31 EDT Ventricular Rate:  119 PR Interval:    QRS Duration: 87 QT Interval:  337 QTC Calculation: 475 R Axis:   22 Text Interpretation: Sinus tachycardia Nonspecific T wave abnormality Confirmed by Cathren Laine ([id number]) on 04/12/2020 2:51:17 PM   Radiology DG Chest Port 1 View  Result Date: 04/12/2020 CLINICAL DATA:  Shortness of breath. EXAM: PORTABLE CHEST 1 VIEW COMPARISON:  September 15, 2018. FINDINGS: The heart size and mediastinal contours are within normal limits. No pneumothorax or effusion is noted. Patchy airspace opacities are noted in both lungs, left greater than right, consistent multifocal pneumonia. The visualized skeletal structures are unremarkable. IMPRESSION: Bilateral multifocal pneumonia, left greater than right. Electronically Signed   By: Lupita Raider M.D.   On: 04/12/2020 14:41    Procedures Procedures (including critical care time)  Medications Ordered in ED Medications - No data to display  ED Course  I have reviewed the triage vital signs and the nursing notes.  Pertinent labs & imaging results that were available during my care of the patient were reviewed by me and considered in my medical decision making (see chart for details).    MDM Rules/Calculators/A&P                          Iv ns. Continuous pulse ox and cardiac monitoring - sinus tachycardia on monitor.   Patient is hypoxic on room air, sats mid 80s, 4 liters [place], sats mid 90s.   Reviewed nursing notes and prior charts for additional history.   Tonya Simmons was evaluated in Emergency Department on 04/12/2020 for the symptoms described in the history of present illness. She was evaluated in the context of the global COVID-19 pandemic, which necessitated consideration that the patient might be at risk for infection with the SARS-CoV-2 virus that causes  COVID-19. Institutional protocols and algorithms that pertain to the evaluation of patients at risk for COVID-19 are in a state of rapid change based on information released by regulatory bodies including the CDC and federal and state organizations. These policies and algorithms were followed during the patient's care in the ED.  Labs reviewed/interpreted by me - wbc 12. Mildly high. Other labs pending.   CXR reviewed/interpreted by me - c/w covid pna.   Recheck, no change in exam. Labs pending.  Signed out to Dr Jeraldine Loots to check labs and admit patient.    Final Clinical Impression(s) / ED Diagnoses Final diagnoses:  None    Rx / DC Orders ED Discharge Orders    None       Cathren Laine, MD 04/12/20 1625

## 2020-04-13 ENCOUNTER — Inpatient Hospital Stay (HOSPITAL_COMMUNITY): Payer: HRSA Program

## 2020-04-13 ENCOUNTER — Encounter (HOSPITAL_COMMUNITY): Payer: Self-pay | Admitting: Internal Medicine

## 2020-04-13 DIAGNOSIS — U071 COVID-19: Secondary | ICD-10-CM | POA: Diagnosis present

## 2020-04-13 DIAGNOSIS — B9620 Unspecified Escherichia coli [E. coli] as the cause of diseases classified elsewhere: Secondary | ICD-10-CM | POA: Diagnosis present

## 2020-04-13 DIAGNOSIS — R7881 Bacteremia: Secondary | ICD-10-CM | POA: Diagnosis present

## 2020-04-13 DIAGNOSIS — J1282 Pneumonia due to coronavirus disease 2019: Secondary | ICD-10-CM | POA: Diagnosis present

## 2020-04-13 LAB — BASIC METABOLIC PANEL
Anion gap: 10 (ref 5–15)
Anion gap: 12 (ref 5–15)
Anion gap: 10 (ref 5–15)
BUN: 13 mg/dL (ref 6–20)
BUN: 14 mg/dL (ref 6–20)
BUN: 18 mg/dL (ref 6–20)
CO2: 20 mmol/L — ABNORMAL LOW (ref 22–32)
CO2: 21 mmol/L — ABNORMAL LOW (ref 22–32)
CO2: 22 mmol/L (ref 22–32)
Calcium: 8.1 mg/dL — ABNORMAL LOW (ref 8.9–10.3)
Calcium: 8.4 mg/dL — ABNORMAL LOW (ref 8.9–10.3)
Calcium: 8.6 mg/dL — ABNORMAL LOW (ref 8.9–10.3)
Chloride: 108 mmol/L (ref 98–111)
Chloride: 108 mmol/L (ref 98–111)
Chloride: 111 mmol/L (ref 98–111)
Creatinine, Ser: 0.82 mg/dL (ref 0.44–1.00)
Creatinine, Ser: 0.72 mg/dL (ref 0.44–1.00)
Creatinine, Ser: 0.79 mg/dL (ref 0.44–1.00)
GFR calc Af Amer: >60 mL/min (ref >60)
GFR calc Af Amer: >60 mL/min (ref >60)
GFR calc Af Amer: >60 mL/min (ref >60)
GFR calc non Af Amer: >60 mL/min (ref >60)
GFR calc non Af Amer: >60 mL/min (ref >60)
GFR calc non Af Amer: >60 mL/min (ref >60)
Glucose, Bld: 252 mg/dL — ABNORMAL HIGH (ref 70–99)
Glucose, Bld: 213 mg/dL — ABNORMAL HIGH (ref 70–99)
Glucose, Bld: 157 mg/dL — ABNORMAL HIGH (ref 70–99)
Potassium: 3.1 mmol/L — ABNORMAL LOW (ref 3.5–5.1)
Potassium: 3.1 mmol/L — ABNORMAL LOW (ref 3.5–5.1)
Potassium: 4.3 mmol/L (ref 3.5–5.1)
Sodium: 138 mmol/L (ref 135–145)
Sodium: 141 mmol/L (ref 135–145)
Sodium: 143 mmol/L (ref 135–145)

## 2020-04-13 LAB — BLOOD CULTURE ID PANEL (REFLEXED) - BCID2

## 2020-04-13 LAB — GLUCOSE, CAPILLARY
Glucose-Capillary: 170 mg/dL — ABNORMAL HIGH (ref 70–99)
Glucose-Capillary: 167 mg/dL — ABNORMAL HIGH (ref 70–99)
Glucose-Capillary: 205 mg/dL — ABNORMAL HIGH (ref 70–99)
Glucose-Capillary: 190 mg/dL — ABNORMAL HIGH (ref 70–99)
Glucose-Capillary: 201 mg/dL — ABNORMAL HIGH (ref 70–99)
Glucose-Capillary: 251 mg/dL — ABNORMAL HIGH (ref 70–99)
Glucose-Capillary: 223 mg/dL — ABNORMAL HIGH (ref 70–99)
Glucose-Capillary: 194 mg/dL — ABNORMAL HIGH (ref 70–99)
Glucose-Capillary: 158 mg/dL — ABNORMAL HIGH (ref 70–99)
Glucose-Capillary: 131 mg/dL — ABNORMAL HIGH (ref 70–99)
Glucose-Capillary: 132 mg/dL — ABNORMAL HIGH (ref 70–99)
Glucose-Capillary: 138 mg/dL — ABNORMAL HIGH (ref 70–99)
Glucose-Capillary: 159 mg/dL — ABNORMAL HIGH (ref 70–99)
Glucose-Capillary: 158 mg/dL — ABNORMAL HIGH (ref 70–99)
Glucose-Capillary: 183 mg/dL — ABNORMAL HIGH (ref 70–99)
Glucose-Capillary: 187 mg/dL — ABNORMAL HIGH (ref 70–99)

## 2020-04-13 LAB — COMPREHENSIVE METABOLIC PANEL
ALT: 28 U/L (ref 0–44)
AST: 35 U/L (ref 15–41)
Albumin: 2.1 g/dL — ABNORMAL LOW (ref 3.5–5.0)
Alkaline Phosphatase: 105 U/L (ref 38–126)
Anion gap: 12 (ref 5–15)
BUN: 15 mg/dL (ref 6–20)
CO2: 23 mmol/L (ref 22–32)
Calcium: 8.6 mg/dL — ABNORMAL LOW (ref 8.9–10.3)
Chloride: 106 mmol/L (ref 98–111)
Creatinine, Ser: 0.78 mg/dL (ref 0.44–1.00)
GFR calc Af Amer: >60 mL/min (ref >60)
GFR calc non Af Amer: >60 mL/min (ref >60)
Glucose, Bld: 211 mg/dL — ABNORMAL HIGH (ref 70–99)
Potassium: 4 mmol/L (ref 3.5–5.1)
Sodium: 141 mmol/L (ref 135–145)
Total Bilirubin: 0.7 mg/dL (ref 0.3–1.2)
Total Protein: 6.3 g/dL — ABNORMAL LOW (ref 6.5–8.1)

## 2020-04-13 LAB — CBC
HCT: 35.1 % — ABNORMAL LOW (ref 36.0–46.0)
Hemoglobin: 11.5 g/dL — ABNORMAL LOW (ref 12.0–15.0)
MCH: 27.8 pg (ref 26.0–34.0)
MCHC: 32.8 g/dL (ref 30.0–36.0)
MCV: 85 fL (ref 80.0–100.0)
Platelets: 200 10*3/uL (ref 150–400)
RBC: 4.13 MIL/uL (ref 3.87–5.11)
RDW: 13 % (ref 11.5–15.5)
WBC: 12.4 10*3/uL — ABNORMAL HIGH (ref 4.0–10.5)
nRBC: 0 % (ref 0.0–0.2)

## 2020-04-13 LAB — CBG MONITORING, ED
Glucose-Capillary: 214 mg/dL — ABNORMAL HIGH (ref 70–99)
Glucose-Capillary: 234 mg/dL — ABNORMAL HIGH (ref 70–99)
Glucose-Capillary: 240 mg/dL — ABNORMAL HIGH (ref 70–99)
Glucose-Capillary: 255 mg/dL — ABNORMAL HIGH (ref 70–99)
Glucose-Capillary: 256 mg/dL — ABNORMAL HIGH (ref 70–99)

## 2020-04-13 LAB — URINALYSIS, ROUTINE W REFLEX MICROSCOPIC
Bacteria, UA: NONE SEEN
Bilirubin Urine: NEGATIVE
Glucose, UA: 150 mg/dL — AB
Ketones, ur: 20 mg/dL — AB
Leukocytes,Ua: NEGATIVE
Nitrite: NEGATIVE
Protein, ur: 30 mg/dL — AB
Specific Gravity, Urine: 1.012 (ref 1.005–1.030)
pH: 6 (ref 5.0–8.0)

## 2020-04-13 LAB — MRSA PCR SCREENING: MRSA by PCR: NEGATIVE

## 2020-04-13 LAB — HIV ANTIBODY (ROUTINE TESTING W REFLEX): HIV Screen 4th Generation wRfx: NONREACTIVE

## 2020-04-13 LAB — LIPASE, BLOOD: Lipase: 25 U/L (ref 11–51)

## 2020-04-13 LAB — BETA-HYDROXYBUTYRIC ACID: Beta-Hydroxybutyric Acid: 0.22 mmol/L (ref 0.05–0.27)

## 2020-04-13 LAB — C-REACTIVE PROTEIN: CRP: 20.3 mg/dL — ABNORMAL HIGH (ref <1.0)

## 2020-04-13 MED ORDER — ACETAMINOPHEN 500 MG PO TABS
1000.0000 mg | ORAL_TABLET | Freq: Four times a day (QID) | ORAL | Status: DC | PRN
  Administered 2020-04-13: 1000 mg via ORAL
  Filled 2020-04-13: qty 2

## 2020-04-13 MED ORDER — SODIUM CHLORIDE 0.9 % IV SOLN
2.0000 g | Freq: Once | INTRAVENOUS | Status: AC
  Administered 2020-04-13: 2 g via INTRAVENOUS
  Filled 2020-04-13: qty 20

## 2020-04-13 MED ORDER — IOHEXOL 350 MG/ML SOLN
100.0000 mL | Freq: Once | INTRAVENOUS | Status: AC | PRN
  Administered 2020-04-13: 100 mL via INTRAVENOUS

## 2020-04-13 MED ORDER — IPRATROPIUM BROMIDE HFA 17 MCG/ACT IN AERS
2.0000 | INHALATION_SPRAY | RESPIRATORY_TRACT | Status: DC | PRN
  Administered 2020-04-13 – 2020-04-17 (×3): 2 via RESPIRATORY_TRACT
  Filled 2020-04-13: qty 12.9

## 2020-04-13 MED ORDER — INSULIN GLARGINE 100 UNIT/ML ~~LOC~~ SOLN
33.0000 [IU] | Freq: Every day | SUBCUTANEOUS | Status: DC
  Filled 2020-04-13: qty 0.33

## 2020-04-13 MED ORDER — INSULIN GLARGINE 100 UNIT/ML ~~LOC~~ SOLN
33.0000 [IU] | Freq: Every day | SUBCUTANEOUS | Status: DC
  Administered 2020-04-13: 33 [IU] via SUBCUTANEOUS
  Filled 2020-04-13 (×2): qty 0.33

## 2020-04-13 MED ORDER — INSULIN ASPART 100 UNIT/ML ~~LOC~~ SOLN: 10.0000 [IU] | Freq: Three times a day (TID) | SUBCUTANEOUS | Status: DC

## 2020-04-13 MED ORDER — IPRATROPIUM-ALBUTEROL 0.5-2.5 (3) MG/3ML IN SOLN: 3.0000 mL | Freq: Four times a day (QID) | RESPIRATORY_TRACT | Status: DC | PRN

## 2020-04-13 MED ORDER — ALBUTEROL SULFATE HFA 108 (90 BASE) MCG/ACT IN AERS
1.0000 | INHALATION_SPRAY | RESPIRATORY_TRACT | Status: DC | PRN
  Filled 2020-04-13: qty 6.7

## 2020-04-13 MED ORDER — ENOXAPARIN SODIUM 60 MG/0.6ML ~~LOC~~ SOLN
55.0000 mg | Freq: Two times a day (BID) | SUBCUTANEOUS | Status: DC
  Administered 2020-04-13 – 2020-04-16 (×6): 55 mg via SUBCUTANEOUS
  Filled 2020-04-13 (×6): qty 0.6

## 2020-04-13 MED ORDER — HYDROCOD POLST-CPM POLST ER 10-8 MG/5ML PO SUER
5.0000 mL | Freq: Two times a day (BID) | ORAL | Status: DC | PRN
  Administered 2020-04-13 – 2020-04-16 (×2): 5 mL via ORAL
  Filled 2020-04-13 (×2): qty 5

## 2020-04-13 MED ORDER — INSULIN ASPART 100 UNIT/ML ~~LOC~~ SOLN
10.0000 [IU] | Freq: Three times a day (TID) | SUBCUTANEOUS | Status: DC
  Administered 2020-04-13 – 2020-04-15 (×6): 10 [IU] via SUBCUTANEOUS

## 2020-04-13 MED ORDER — INSULIN ASPART 100 UNIT/ML ~~LOC~~ SOLN
0.0000 [IU] | Freq: Three times a day (TID) | SUBCUTANEOUS | Status: DC
  Administered 2020-04-14: 20 [IU] via SUBCUTANEOUS
  Administered 2020-04-14 (×2): 11 [IU] via SUBCUTANEOUS
  Administered 2020-04-15 (×2): 7 [IU] via SUBCUTANEOUS
  Administered 2020-04-15: 4 [IU] via SUBCUTANEOUS
  Administered 2020-04-16: 15 [IU] via SUBCUTANEOUS
  Administered 2020-04-16: 11 [IU] via SUBCUTANEOUS
  Administered 2020-04-16: 3 [IU] via SUBCUTANEOUS
  Administered 2020-04-17 (×2): 15 [IU] via SUBCUTANEOUS
  Administered 2020-04-17 – 2020-04-18 (×2): 7 [IU] via SUBCUTANEOUS
  Administered 2020-04-18: 20 [IU] via SUBCUTANEOUS
  Administered 2020-04-18: 3 [IU] via SUBCUTANEOUS

## 2020-04-13 MED ORDER — POTASSIUM CHLORIDE 20 MEQ PO PACK
40.0000 meq | PACK | Freq: Once | ORAL | Status: AC
  Administered 2020-04-13: 40 meq via ORAL
  Filled 2020-04-13: qty 2

## 2020-04-13 MED ORDER — PNEUMOCOCCAL VAC POLYVALENT 25 MCG/0.5ML IJ INJ
0.5000 mL | INJECTION | INTRAMUSCULAR | Status: DC
  Filled 2020-04-13: qty 0.5

## 2020-04-13 MED ORDER — INSULIN ASPART 100 UNIT/ML ~~LOC~~ SOLN
0.0000 [IU] | Freq: Every day | SUBCUTANEOUS | Status: DC
  Administered 2020-04-14: 4 [IU] via SUBCUTANEOUS
  Administered 2020-04-16: 3 [IU] via SUBCUTANEOUS

## 2020-04-13 MED ORDER — PHENOL 1.4 % MT LIQD
1.0000 | OROMUCOSAL | Status: DC | PRN
  Administered 2020-04-13: 1 via OROMUCOSAL
  Filled 2020-04-13: qty 177

## 2020-04-13 MED ORDER — SODIUM CHLORIDE 0.9 % IV SOLN
2.0000 g | INTRAVENOUS | Status: DC
  Administered 2020-04-14 – 2020-04-18 (×5): 2 g via INTRAVENOUS
  Filled 2020-04-13 (×5): qty 20

## 2020-04-13 MED ORDER — INFLUENZA VAC SPLIT QUAD 0.5 ML IM SUSY
0.5000 mL | PREFILLED_SYRINGE | INTRAMUSCULAR | Status: DC
  Filled 2020-04-13: qty 0.5

## 2020-04-13 NOTE — Progress Notes (Signed)
Inpatient Diabetes Program Recommendations  AACE/ADA: New Consensus Statement on Inpatient Glycemic Control   Target Ranges:  Prepandial:   less than 140 mg/dL      Peak postprandial:   less than 180 mg/dL (1-2 hours)      Critically ill patients:  140 - 180 mg/dL  Results for Tonya Simmons, Tonya Simmons (MRN [id number]) as of 04/13/2020 13:51  Ref. Range 04/13/2020 11:02 04/13/2020 11:49 04/13/2020 12:43  Glucose-Capillary Latest Ref Range: 70 - 99 mg/dL 378 (H) 588 (H) 502 (H)   Results for Tonya Simmons, Tonya Simmons (MRN [id number]) as of 04/13/2020 12:07  Ref. Range 04/13/2020 00:49 04/13/2020 01:46 04/13/2020 02:48 04/13/2020 04:36 04/13/2020 05:44 04/13/2020 06:45 04/13/2020 08:42 04/13/2020 09:45  Glucose-Capillary Latest Ref Range: 70 - 99 mg/dL 767 (H) 209 (H) 470 (H) 214 (H) 170 (H) 167 (H) 205 (H) 190 (H)  Results for Tonya Simmons, Tonya Simmons (MRN [id number]) as of 04/13/2020 12:07  Ref. Range 04/12/2020 15:28  CO2 Latest Ref Range: 22 - 32 mmol/L 17 (L)  Glucose Latest Ref Range: 70 - 99 mg/dL 476 (HH)  Anion gap Latest Ref Range: 5 - 15  18 (H)  Results for Tonya Simmons, Tonya Simmons (MRN [id number]) as of 04/13/2020 12:07  Ref. Range 04/12/2020 15:28  Beta-Hydroxybutyric Acid Latest Ref Range: 0.05 - 0.27 mmol/L 4.00 (H)  Results for Tonya Simmons, Tonya Simmons (MRN [id number]) as of 04/13/2020 12:07  Ref. Range 03/08/2017 12:24 04/12/2020 22:07  Hemoglobin A1C Latest Ref Range: 4.8 - 5.6 % 7.4% 12.0 (H)   Review of Glycemic Control  Diabetes history: DM2 Outpatient Diabetes medications: None Current orders for Inpatient glycemic control: IV insulin  NOTE: Called patient's room phone with interpreter [phone]) and no answer. Had them call patient's cell phone number and no answer on that number either. Will attempt to call again to get more information on patient's hx with DM and to see if she has been on any DM medications in the past. RN today notes that patient is hoarse, sick, and on a lot of  supplement oxygen. Therefore, will follow up with patient when more appropriate. Noted note by Dr. Huel Cote today which notes IV insulin would be continued at this time. Current IV insulin drip rate at 11.5 units/hour and glucose 223 mg/dl at 44:96 today. Will follow along.  Thanks, Orlando Penner, RN, MSN, CDE Diabetes Coordinator Inpatient Diabetes Program [phone] (Team Pager from 8am to 5pm)

## 2020-04-13 NOTE — Progress Notes (Addendum)
Subjective:   [name] states she is feeling more short of breath this morning with a worsened productive cough. She has some chest pain with deep coughs but not otherwise. She endorses continued mouth pain with difficulty swallowing but states it has improved some. She endorses generalized abdominal pain but denies nausea, vomiting. Denies dysuria.   No overnight events.   On re-evaluation in the afternoon, patient was experiencing increased SOB requiring a NRB. She states she feels as though she is on so many medications, she is flying. She notes improvement in her cough and abdominal pain though. She denies any chest pain, nausea, vomiting.   Objective:  Vital signs in last 24 hours: Vitals:   04/13/20 0430 04/13/20 0500 04/13/20 0530 04/13/20 0640  BP: (!) 146/109 (!) 131/99  (!) 156/107  Pulse: (!) 102 (!) 104  99  Resp:    (!) 38  Temp:    98.5 F (36.9 C)  TempSrc:    Oral  SpO2: 97%  97% 91%  Weight:    106 kg  Height:    5' (1.524 m)    Physical Exam Nursing note reviewed.  Constitutional:      Appearance: She is obese. She is ill-appearing.  HENT:     Head: Normocephalic and atraumatic.     Mouth/Throat:     Mouth: Mucous membranes are dry.     Tongue: Lesions (+ thrush) present.     Comments: Able to swallow secretions without difficulty Cardiovascular:     Rate and Rhythm: Regular rhythm. Tachycardia present.     Heart sounds: No murmur heard.   Pulmonary:     Effort: Tachypnea and respiratory distress (mild) present.     Breath sounds: Stridor present. Examination of the right-middle field reveals wheezing. Examination of the left-middle field reveals rales. Examination of the right-lower field reveals wheezing and rales. Examination of the left-lower field reveals wheezing and rales. Wheezing and rales present.  Abdominal:     General: Bowel sounds are decreased. There is no distension.     Palpations: Abdomen is soft.     Tenderness: There is  generalized abdominal tenderness (worse on the right upper and lower quadrent than left). There is no guarding.  Skin:    General: Skin is warm and dry.  Neurological:     General: No focal deficit present.     Mental Status: She is alert and oriented to person, place, and time. Mental status is at baseline.  Psychiatric:        Mood and Affect: Mood normal.        Behavior: Behavior normal.    Assessment/Plan:  Principal Problem:   Pneumonia due to COVID-19 virus Active Problems:   DKA (diabetic ketoacidosis) (HCC)   E. coli bacteremia  Tonya Simmons is a 39 y.o. with pertinent PMH of T2DM, HTN  who presented with fever and admited for DKA and COVID pneumonia   # COVID-19 Pneumonia Oxygen requirement increased overnight to 8L HFNC and additionally so to 15 L HFNC with overlying NRB at 15L. With this, she is saturating in the 92-97%. Desaturations to low 80s with any movement. Inflammatory markers are quite elevated with D-dimer of 20 and CRP of 20. Although patient does have stridor, wheezing can be heard in bilateral bases; will add Albuterol and Ipratropium inhalers PRN.   - Continue Baricitinib Day 1/14  - Continue Remdesivir Day 1/5 - Continue Decadron Day 1/10 - Trend CMP, inflammatory markers - Supplemental O2 as  need to keep oxygen saturation >88 % - Early ambulation, proning as tolerated, incentive spirometry - Added Tussionex q12h PRN for cough - Continue Robitussin q4h PRN for cough - Added Albuterol and Atrovent PRN for wheezing   # Diabetes Ketoacidosis Secondary to medication non-compliance and infection. Bicarb and AG improving overnight with continued insulin gtt. Morning labs show near normalization. Beta-hydroxybutyrate acid normalized. Given patient's respiratory status, will wait to transition to [facility], as patient will have difficulty eating with NRB on. Afternoon labs still pending.   - Continue insulin gtt - Normal saline at 150 mL/hr until CBG  reaches 250 - Switch to D5-1/2 NS when CBG less than 250 - BMP every 4 hours, CBG Q1H - Will wait to transition to [facility] until patient is able to eat - Continue insulin drip for 1-2 more hours after eating, then discontinue - Tentative [place] regimen: Lantus 33 units + Novolog 10 units TID with meals + SSI (moderate)  # E. Coli Bacteremia Source is currently unknown; likely urinary versus GI (given abdominal pain). Urinalysis pending. Will also obtain imaging of abdomen once more stable from a respiratory stand point   - Ceftriaxone 2 g q24h - Urinalysis pending - CT A/P w/wo contrast once stable   # Sinus Tachycardia Fluctuates between 110-120 at rest with increases to 130-140. Given multiple infectious processes and DKA, tachycardia is not unexpected. However if no improvements are made with fluids/abx, or HR continues to worsen or hypotension occurs, would obtain CTA chest for PE rule out.   - Telemetry   # Pneumomediastinum Noted on CT chest to be primarily in the anterior mediastinum but tracks along the thoracic inlet. Overnight team discussed with Radiology who stated this has been commonly seen with COVID-19 pneumonia. Will need to be careful with any positive pressure; high risk if patient will need bipap or intubation. No crepitus on exam.  - Continue to monitor  # Esophageal Candidiasis  # Oral Thrush - Continue fluconazole 400mg  daily for 7-14 days - Consider adding viscous Lidocaine if continued pain  # Hypertension On amlodipine 5mg  daily at home. Current bp at 110/77  - Hold home anti-hypertensives for now  # Acute Kidney Injury: Resolved   Diet: NPO VTE: Enoxaparin IVF: D5-1/2 NS Code: Full  Dispo: Pending further medical evaluation and stabilization.   Dr. Internal Medicine PGY-2  Pager: [phone] After 5pm on weekdays and 1pm on weekends: On Call pager [phone]  04/13/2020, 8:00 AM

## 2020-04-13 NOTE — TOC Progression Note (Addendum)
Transition of Care [facility]) - Progression Note    Patient Details  Name: Rhanda Lemire MRN: [id number] Date of Birth: [birth year]  Transition of [facility] CM/SW Contact  Nance Pear, RN Phone Number: 04/13/2020, 2:23 PM  Clinical Narrative:    Case manager set up appointment for COVID clinic for patient on 04/26/20 at 9:30 am who will also make appointment for [facility].  Patient unable to do much talking due to COVID SOB so reached sister, Liliane Bade and her spouse [facility] interpreted for her about this appointment.  Discussed that this appointment is very important and not to miss it.  Also discussed about discharge planning and [facility] will be transporting patient home and if oxygen is needed will be able to open house for supplier.  Case manager continuing to monitor for additional needs for patient.   Expected Discharge Plan: Home w Home Health Services Barriers to Discharge: Continued Medical Work up  Expected Discharge Plan and Services Expected Discharge Plan: Home w Home Health Services   Discharge Planning Services: CM Consult   Living arrangements for the past 2 months: Single Family Home                  Readmission Risk Interventions No flowsheet data found.

## 2020-04-13 NOTE — ED Notes (Signed)
Attempted report x1. 

## 2020-04-13 NOTE — Plan of Care (Signed)
  Problem: Education: Goal: Knowledge of risk factors and measures for prevention of condition will improve Outcome: Progressing   Problem: Respiratory: Goal: Will maintain a patent airway Outcome: Progressing   Problem: Education: Goal: Knowledge of General Education information will improve Description: Including pain rating scale, medication(s)/side effects and non-pharmacologic comfort measures Outcome: Progressing   Problem: Clinical Measurements: Goal: Respiratory complications will improve Outcome: Progressing   Problem: Nutrition: Goal: Adequate nutrition will be maintained Outcome: Progressing   Problem: Education: Goal: Ability to describe self-care measures that may prevent or decrease complications (Diabetes Survival Skills Education) will improve Outcome: Progressing   Problem: Health Behavior/Discharge Planning: Goal: Ability to manage health-related needs will improve Outcome: Not Progressing   Problem: Clinical Measurements: Goal: Diagnostic test results will improve Outcome: Not Progressing   Problem: Health Behavior/Discharge Planning: Goal: Ability to identify and utilize available resources and services will improve Outcome: Not Progressing Goal: Ability to manage health-related needs will improve Outcome: Not Progressing   Problem: Metabolic: Goal: Ability to maintain appropriate glucose levels will improve Outcome: Not Progressing

## 2020-04-13 NOTE — Plan of Care (Signed)
  Problem: Education: Goal: Knowledge of risk factors and measures for prevention of condition will improve Outcome: Progressing   Problem: Respiratory: Goal: Will maintain a patent airway Outcome: Progressing   Problem: Education: Goal: Knowledge of General Education information will improve Description: Including pain rating scale, medication(s)/side effects and non-pharmacologic comfort measures Outcome: Progressing   Problem: Health Behavior/Discharge Planning: Goal: Ability to manage health-related needs will improve Outcome: Progressing   Problem: Clinical Measurements: Goal: Diagnostic test results will improve Outcome: Progressing Goal: Respiratory complications will improve Outcome: Progressing   Problem: Nutrition: Goal: Adequate nutrition will be maintained Outcome: Progressing   Problem: Education: Goal: Ability to describe self-care measures that may prevent or decrease complications (Diabetes Survival Skills Education) will improve Outcome: Progressing   Problem: Health Behavior/Discharge Planning: Goal: Ability to identify and utilize available resources and services will improve Outcome: Progressing Goal: Ability to manage health-related needs will improve Outcome: Progressing   Problem: Metabolic: Goal: Ability to maintain appropriate glucose levels will improve Outcome: Progressing

## 2020-04-13 NOTE — Progress Notes (Signed)
PHARMACY - PHYSICIAN COMMUNICATION CRITICAL VALUE ALERT - BLOOD CULTURE IDENTIFICATION (BCID)  Tonya Simmons is an 39 y.o. female who presented to [facility] on 04/12/2020 with a chief complaint of fever, cough, weakness, and anorexia.  Assessment:  Pt admitted for Covid hypoxia, now w/ 2/4 blood cx bottles growing E.coli; UA ordered but not yet reported.  Name of physician (or Provider) Contacted: Dr Kirke Corin via secure chat  Current antibiotics: None; on remdesivir for Covid and fluconazole for esophageal candidiasis.  Changes to prescribed antibiotics recommended:  Recommendations accepted by provider -- start Rocephin 2g IV Q24H.  Results for orders placed or performed during the hospital encounter of 04/12/20  Blood Culture ID Panel (Reflexed) (Collected: 04/12/2020  3:27 PM)  Result Value Ref Range   Enterococcus faecalis NOT DETECTED NOT DETECTED   Enterococcus Faecium NOT DETECTED NOT DETECTED   Listeria monocytogenes NOT DETECTED NOT DETECTED   Staphylococcus species NOT DETECTED NOT DETECTED   Staphylococcus aureus (BCID) NOT DETECTED NOT DETECTED   Staphylococcus epidermidis NOT DETECTED NOT DETECTED   Staphylococcus lugdunensis NOT DETECTED NOT DETECTED   Streptococcus species NOT DETECTED NOT DETECTED   Streptococcus agalactiae NOT DETECTED NOT DETECTED   Streptococcus pneumoniae NOT DETECTED NOT DETECTED   Streptococcus pyogenes NOT DETECTED NOT DETECTED   A.calcoaceticus-baumannii NOT DETECTED NOT DETECTED   Bacteroides fragilis NOT DETECTED NOT DETECTED   Enterobacterales DETECTED (A) NOT DETECTED   Enterobacter cloacae complex NOT DETECTED NOT DETECTED   Escherichia coli DETECTED (A) NOT DETECTED   Klebsiella aerogenes NOT DETECTED NOT DETECTED   Klebsiella oxytoca NOT DETECTED NOT DETECTED   Klebsiella pneumoniae NOT DETECTED NOT DETECTED   Proteus species NOT DETECTED NOT DETECTED   Salmonella species NOT DETECTED NOT DETECTED   Serratia marcescens  NOT DETECTED NOT DETECTED   Haemophilus influenzae NOT DETECTED NOT DETECTED   Neisseria meningitidis NOT DETECTED NOT DETECTED   Pseudomonas aeruginosa NOT DETECTED NOT DETECTED   Stenotrophomonas maltophilia NOT DETECTED NOT DETECTED   Candida albicans NOT DETECTED NOT DETECTED   Candida auris NOT DETECTED NOT DETECTED   Candida glabrata NOT DETECTED NOT DETECTED   Candida krusei NOT DETECTED NOT DETECTED   Candida parapsilosis NOT DETECTED NOT DETECTED   Candida tropicalis NOT DETECTED NOT DETECTED   Cryptococcus neoformans/gattii NOT DETECTED NOT DETECTED   CTX-M ESBL NOT DETECTED NOT DETECTED   Carbapenem resistance IMP NOT DETECTED NOT DETECTED   Carbapenem resistance KPC NOT DETECTED NOT DETECTED   Carbapenem resistance NDM NOT DETECTED NOT DETECTED   Carbapenem resist OXA 48 LIKE NOT DETECTED NOT DETECTED   Carbapenem resistance VIM NOT DETECTED NOT DETECTED    Vernard Gambles, PharmD, BCPS  04/13/2020  6:13 AM

## 2020-04-14 ENCOUNTER — Inpatient Hospital Stay (HOSPITAL_COMMUNITY): Payer: HRSA Program

## 2020-04-14 DIAGNOSIS — U071 COVID-19: Secondary | ICD-10-CM

## 2020-04-14 DIAGNOSIS — R7989 Other specified abnormal findings of blood chemistry: Secondary | ICD-10-CM

## 2020-04-14 LAB — COMPREHENSIVE METABOLIC PANEL
ALT: 38 U/L (ref 0–44)
AST: 42 U/L — ABNORMAL HIGH (ref 15–41)
Albumin: 2 g/dL — ABNORMAL LOW (ref 3.5–5.0)
Alkaline Phosphatase: 105 U/L (ref 38–126)
Anion gap: 14 (ref 5–15)
BUN: 21 mg/dL — ABNORMAL HIGH (ref 6–20)
CO2: 20 mmol/L — ABNORMAL LOW (ref 22–32)
Calcium: 8.3 mg/dL — ABNORMAL LOW (ref 8.9–10.3)
Chloride: 107 mmol/L (ref 98–111)
Creatinine, Ser: 0.77 mg/dL (ref 0.44–1.00)
GFR calc Af Amer: >60 mL/min (ref >60)
GFR calc non Af Amer: >60 mL/min (ref >60)
Glucose, Bld: 287 mg/dL — ABNORMAL HIGH (ref 70–99)
Potassium: 3.8 mmol/L (ref 3.5–5.1)
Sodium: 141 mmol/L (ref 135–145)
Total Bilirubin: 0.8 mg/dL (ref 0.3–1.2)
Total Protein: 6 g/dL — ABNORMAL LOW (ref 6.5–8.1)

## 2020-04-14 LAB — CBC
HCT: 33.6 % — ABNORMAL LOW (ref 36.0–46.0)
Hemoglobin: 11.1 g/dL — ABNORMAL LOW (ref 12.0–15.0)
MCH: 27.8 pg (ref 26.0–34.0)
MCHC: 33 g/dL (ref 30.0–36.0)
MCV: 84.2 fL (ref 80.0–100.0)
Platelets: 194 10*3/uL (ref 150–400)
RBC: 3.99 MIL/uL (ref 3.87–5.11)
RDW: 13.2 % (ref 11.5–15.5)
WBC: 13.1 10*3/uL — ABNORMAL HIGH (ref 4.0–10.5)
nRBC: 0 % (ref 0.0–0.2)

## 2020-04-14 LAB — GLUCOSE, CAPILLARY
Glucose-Capillary: 275 mg/dL — ABNORMAL HIGH (ref 70–99)
Glucose-Capillary: 256 mg/dL — ABNORMAL HIGH (ref 70–99)
Glucose-Capillary: 289 mg/dL — ABNORMAL HIGH (ref 70–99)
Glucose-Capillary: 359 mg/dL — ABNORMAL HIGH (ref 70–99)
Glucose-Capillary: 311 mg/dL — ABNORMAL HIGH (ref 70–99)

## 2020-04-14 LAB — C-REACTIVE PROTEIN: CRP: 18.9 mg/dL — ABNORMAL HIGH (ref <1.0)

## 2020-04-14 LAB — D-DIMER, QUANTITATIVE: D-Dimer, Quant: 5.67 ug/mL-FEU — ABNORMAL HIGH (ref 0.00–0.50)

## 2020-04-14 MED ORDER — LIVING WELL WITH DIABETES BOOK - IN SPANISH
Freq: Once | Status: AC
  Filled 2020-04-14: qty 1

## 2020-04-14 MED ORDER — INSULIN STARTER KIT- SYRINGES (SPANISH)
1.0000 | Freq: Once | Status: DC
  Filled 2020-04-14: qty 1

## 2020-04-14 MED ORDER — INSULIN GLARGINE 100 UNIT/ML ~~LOC~~ SOLN
41.0000 [IU] | Freq: Every day | SUBCUTANEOUS | Status: DC
  Administered 2020-04-14: 41 [IU] via SUBCUTANEOUS
  Filled 2020-04-14 (×2): qty 0.41

## 2020-04-14 MED ORDER — INSULIN ASPART 100 UNIT/ML ~~LOC~~ SOLN
10.0000 [IU] | Freq: Once | SUBCUTANEOUS | Status: AC
  Administered 2020-04-14: 10 [IU] via SUBCUTANEOUS

## 2020-04-14 NOTE — Progress Notes (Signed)
Inpatient Diabetes Program Recommendations  AACE/ADA: New Consensus Statement on Inpatient Glycemic Control  Target Ranges:  Prepandial:   less than 140 mg/dL      Peak postprandial:   less than 180 mg/dL (1-2 hours)      Critically ill patients:  140 - 180 mg/dL   Results for Tonya Simmons, Tonya Simmons (MRN [id number]) as of 04/14/2020 10:49  Ref. Range 04/13/2020 14:42 04/13/2020 16:04 04/13/2020 16:44 04/13/2020 17:55 04/13/2020 18:49 04/13/2020 20:16 04/13/2020 21:26 04/13/2020 22:43 04/14/2020 04:34 04/14/2020 07:51  Glucose-Capillary Latest Ref Range: 70 - 99 mg/dL 158 (H) 131 (H) 132 (H) 138 (H) 159 (H) 158 (H) 183 (H) 187 (H) 275 (H) 256 (H)   Review of Glycemic Control  Diabetes history: DM2 Outpatient Diabetes medications: None Current orders for Inpatient glycemic control: Lantus 33 units QHS, Novolog 0-20 units TID with meals, Novolog 0-5 units QHS, Novolog 10 units TID with meals; Decadron 6 mg daily  Inpatient Diabetes Program Recommendations:    Insulin: If steroids continued as ordered, please consider increasing Lantus to 43 units QHS.  NOTE: A1C 12% on 04/12/20 indicating an average glucose of 298 mg/dl over the past 2-3 months. Per chart, TOC is planning to make her an appointment at [facility] for follow up. Spoke with patient over the phone with interpreter [phone]) about diabetes. Patient reports that she was dx with DM over 4 years ago and that she was taking oral DM medication but she does not remember the name of the medication. Patient states that she has no insurance or PCP. Patient reports that she was never educated on diabetes or carb modified diet. Patient reports that she can read some in Spanish and very little in [place]. Informed patient that a Living Well with DM book in Spanish has been ordered. Encouraged patient to have a family member read through the entire book with her.   Discussed A1C results (12% on 04/12/20) and explained what an A1C is  and informed patient that current A1C indicates an average glucose of 298 mg/dl over the past 2-3 months. Discussed basic pathophysiology of DM Type 2, basic home care, importance of checking CBGs and maintaining good CBG control to prevent long-term and short-term complications. Reviewed glucose and A1C goals.  Reviewed signs and symptoms of hyperglycemia and hypoglycemia along with treatment for both. Discussed impact of nutrition, exercise, stress, sickness, and medications on diabetes control. Discussed carb modified diet and encouraged patient to eliminate sugary beverages and eat carbohydrates in moderation.  Explained to patient that she is currently ordered steroids which are contributing to hyperglycemia.  Explained to patient that at this time, it is not clear if she will need to be discharged on insulin or not. Informed patient that I would order an insulin starter kit in Spanish and I will ask nursing to work with her on insulin administration and allow her to self inject insulin.  Asked patient to check glucose 3-4 times per day (before meals and at bedtime) and to keep a log book of glucose readings and insulin taken. Explained how the doctor she follows up with can use the log book to continue to make adjustments with DM medications if needed. Informed patient that a consult for RD would be ordered for further education on Carb Modified diet.   Informed patient that TOC would be assisting with arranging follow up and with medication needs.   Patient verbalized understanding of information discussed and she states that she has no further questions at  this time related to diabetes.   RNs to provide ongoing basic DM education at bedside with this patient and engage patient to actively check blood glucose and administer insulin injections.    Thanks, Barnie Alderman, RN, MSN, CDE Diabetes Coordinator Inpatient Diabetes Program [phone] (Team Pager from 8am to 5pm)

## 2020-04-14 NOTE — Progress Notes (Signed)
   04/14/20 0800  Assess: MEWS Score  Temp 98.9 F (37.2 C)  BP (!) 152/111  Pulse Rate (!) 107  ECG Heart Rate (!) 107  Resp (!) 26  SpO2 91 %  O2 Device HFNC  O2 Flow Rate (L/min) 10 L/min  Assess: MEWS Score  MEWS Temp 0  MEWS Systolic 0  MEWS Pulse 1  MEWS RR 2  MEWS LOC 0  MEWS Score 3  MEWS Score Color Yellow  Assess: if the MEWS score is Yellow or Red  Were vital signs taken at a resting state? Yes  Focused Assessment No change from prior assessment  Early Detection of Sepsis Score *See Row Information* High  MEWS guidelines implemented *See Row Information* Yes  Treat  MEWS Interventions Administered scheduled meds/treatments;Administered prn meds/treatments  Pain Scale 0-10  Pain Score 0  Take Vital Signs  Increase Vital Sign Frequency  Yellow: Q 2hr X 2 then Q 4hr X 2, if remains yellow, continue Q 4hrs  Escalate  MEWS: Escalate Yellow: discuss with charge nurse/RN and consider discussing with provider and RRT  Notify: Charge Nurse/RN  Name of Charge Nurse/RN Notified Elisa, RN  Date Charge Nurse/RN Notified 04/14/20  Time Charge Nurse/RN Notified 0900

## 2020-04-14 NOTE — Discharge Instructions (Addendum)
Insuficiencia respiratoria aguda en los adultos (Acute Respiratory Failure, Adult) La insuficiencia respiratoria aguda ocurre cuando no pasa suficiente oxgeno de los pulmones al cuerpo. Cuando esto sucede, los pulmones tienen dificultad para eliminar el dixido de carbono de la [place]. A medida que el dixido de carbono se acumula, el nivel de oxgeno en la sangre disminuye considerablemente. La insuficiencia respiratoria aguda es una emergencia mdica. Puede aparecer rpidamente, pero es temporaria si se trata pronto. La capacidad pulmonar, o cunto aire pueden Advanced Micro Devices, puede mejorar con el [place], el ejercicio y Scientist, research (medical). CAUSAS Hay muchas causas posibles de la insuficiencia respiratoria aguda, entre ellas:  Lesin pulmonar.  Lesin torcica o dao en las costillas o los tejidos cercanos a los pulmones.  Afecciones pulmonares que afectan el flujo de aire y [place] y desde los pulmones, como la neumona, [place] sndrome de dificultad respiratoria aguda y la fibrosis [place].  Afecciones mdicas, como ictus y lesiones en la mdula espinal, que afectan los msculos y los nervios que controlan la respiracin.  Infeccin en la sangre (sepsis).  Inflamacin del pncreas (pancreatitis).  Un cogulo de [employer] pulmones (embolia pulmonar).  Una transfusin de sangre de gran volumen.  Quemaduras.  Ahogamiento inminente.  Convulsiones.  Inhalacin de humo.  Reacciones a medicamentos.  Sobredosis de alcohol o drogas. FACTORES DE RIESGO Es ms probable que esta afeccin se manifieste en las personas que tienen:  Una obstruccin en las vas respiratorias.  Asma.  Una afeccin o una enfermedad que daa o Consolidated Edison, los nervios, los huesos o los tejidos que participan en la respiracin.  Una infeccin grave.  Un problema de salud que bloquea el reflejo inconsciente que est involucrado en la respiracin, como el hipotiroidismo o la apnea del  sueo.  Un traumatismo o lesin pulmonar. SNTOMAS La dificultad para respirar es el sntoma principal de la insuficiencia respiratoria aguda. Otros sntomas son:  Respiracin rpida.  Ansiedad o inquietud.  Piel, labios o yemas de los dedos de color azulado (cianosis).  Frecuencia cardaca acelerada.  Ritmo cardaco anormal (arritmia).  Confusin o cambios en la conducta.  Cansancio o prdida de Water engineer.  Somnolencia o prdida de Warden/ranger. DIAGNSTICO El mdico puede diagnosticar insuficiencia respiratoria aguda en funcin de su historia clnica y de un examen fsico. Durante el examen, el mdico escuchar el corazn y tratar de Engineer, manufacturing sonidos crepitantes o sibilancias en los pulmones. Tambin pueden hacerle estudios para confirmar el diagnstico y Chief Strategy Officer qu est produciendo la insuficiencia respiratoria. Estos estudios pueden Johnson & Johnson siguientes:  Medicin de la cantidad de oxgeno en la sangre (oximetra de pulso). La medicin se obtiene con un dispositivo pequeo que se coloca en el dedo, el lbulo o un dedo del pie.  Otros anlisis de sangre para medir los gases en la sangre y para buscar signos de infeccin.  Toma de muestras del lquido cefalorraqudeo o del lquido traqueal para Engineer, manufacturing infecciones.  Radiografa de trax para buscar lquidos en espacios que deberan estar llenos de aire.  Electrocardiograma (ECG) para observar la actividad elctrica del corazn. TRATAMIENTO El tratamiento para esta afeccin suele realizarse en la unidad de cuidados intensivos (UCI) de un hospital. El tratamiento depende de la causa. Se pueden necesitar uno o ms tratamientos para que los sntomas mejoren. El tratamiento puede incluir lo siguiente:  Oxgeno complementario. Para administrar oxgeno extra, se [place] un tubo en la Darene Lamer, una mascarilla o una campana de oxgeno.  Un dispositivo como una mquina de  presin positiva continua (CPAP) o de presin positiva de  dos niveles (BiPAP o BPAP) en las vas respiratorias. Este tratamiento utiliza una presin de aire leve para Pharmacologist las vas respiratorias abiertas. Se le colocar Primary school teacher u otro dispositivo en la nariz o la boca. Un tuvo conectado a TEFL teacher oxgeno a travs de Nurse, adult.  Respirador. Este tratamiento ayuda a transportar aire desde y [employer]. Esto se puede realizar con Neomia Dear bolsa y [address] o [place]. Para este tratamiento, se coloca un tubo en la trquea para que el aire y el oxgeno Geophysicist/field seismologist a los pulmones.  Oxigenacin por membrana extracorprea (OMEC). Este tratamiento asume la funcin del corazn y los pulmones, de forma temporaria, para suministrar oxgeno y Pharmacologist el dixido de carbono. A travs de la OMEC, los pulmones tienen la posibilidad de recuperarse. Este tratamiento se puede utilizar cuando el ventilador no es [place].  Traqueostoma. En este procedimiento se hace un orificio en el cuello en el que se inserta un tubo para que pueda respirar.  Recibir lquidos y [employer].  Mecer la cama para ayudar a Industrial/product designer. INSTRUCCIONES PARA EL CUIDADO EN EL HOGAR  Tome los medicamentos de venta libre y los recetados solamente como se lo haya indicado el mdico.  Retome sus actividades normales como se lo haya indicado el mdico. Pregntele al mdico qu actividades son seguras para usted.  Concurra a todas las visitas de control como se lo haya indicado el mdico. Esto es importante. PREVENCIN Tratar infecciones y afecciones mdicas que pueden producir insuficiencia respiratoria aguda puede contribuir a IT sales professional de esta afeccin. SOLICITE ATENCIN MDICA SI:  Lance Muss.  Los sntomas no mejoran o empeoran. SOLICITE ATENCIN MDICA DE INMEDIATO SI:  Presenta dificultades respiratorias sbitas.  Pierde la conciencia.  Tiene cianosis o se torna de un color azulado.  Se le acelera la frecuencia cardaca.  Se  siente confundido. Estos sntomas pueden representar un problema grave que constituye Radio broadcast assistant. No espere hasta que los sntomas desaparezcan. Solicite atencin mdica de inmediato. Comunquese con el servicio de emergencias de su localidad (911 en los [place]). No conduzca por sus propios medios OfficeMax Incorporated. Esta informacin no tiene Theme park manager el consejo del mdico. Asegrese de hacerle al mdico cualquier pregunta que tenga. Document Revised: 05/21/2016 Document Reviewed: 01/17/2016 Elsevier Patient Education  2020 Elsevier Inc.   Preguntas frecuentes sobre el COVID-19 COVID-19 Frequently Asked Questions El COVID-19 (enfermedad por coronavirus) es una infeccin causada por una gran familia de virus. Algunos virus causan [place] y otros causan enfermedades en animales tales como los camellos, los gatos y los murcilagos. En algunos casos, los virus que causan [employer] pueden transmitirse a los seres humanos. De dnde provino el coronavirus? En diciembre de 2019, [place] le inform a Chief Technology Officer (Organizacin Mundial de la [place], Best boy) acerca de varios casos de enfermedad pulmonar (enfermedad respiratoria humana). Estos casos estaban vinculados con un mercado abierto de frutos de mar y [place] en la ciudad de [place]. El vnculo con el mercado de ganado y Liberty Global sugiere que el virus puede haberse propagado de los animales a los [place]. Sin embargo, desde Chiropodist brote en diciembre, tambin se ha demostrado que el virus se contagia de [place] persona a Educational psychologist. Cul es el nombre de la enfermedad y del virus? Nombre de la enfermedad Al principio, esta enfermedad se llam nuevo coronavirus. Esto se debe a que los cientficos  determinaron que la enfermedad era causada por un nuevo virus respiratorio. Brunswick Corporation (Organizacin Mundial de la [place], [place]) ahora ha dado a la enfermedad el nombre de COVID-19, o  enfermedad por coronavirus. Nombre del virus El virus causante de la enfermedad se conoce como coronavirus de tipo 2 causante del sndrome respiratorio agudo grave (SARS-CoV-2). Ms informacin sobre el nombre de la enfermedad y el virus Work[facility] Organization (Organizacin Mundial de la [place]) (OMS): www.who.int/emergencies/diseases/novel-coronavirus-2019/technical-guidance/naming-the-coronavirus-disease-(covid-2019)-and-the-virus-that-causes-it Quines estn en riesgo de sufrir complicaciones debido a la enfermedad por coronavirus? Algunas personas pueden tener un riesgo ms alto de tener complicaciones debido a la enfermedad por coronavirus. Entre ellas se encuentran los ONEOK y las personas que tienen enfermedades crnicas, como enfermedad cardaca, diabetes y enfermedad pulmonar. Si tiene un riesgo ms alto de Sales executive, tome estas precauciones adicionales:  Personal assistant en su casa todo lo que sea posible.  Evitar las reuniones sociales y los viajes.  Evitar el contacto cercano con Economist. Permanecer a una distancia de al menos 6 pies (2 m) de las Nucor Corporation, si es posible.  Lavarse las manos frecuentemente con agua y [place] durante al menos 20segundos.  Evitar tocarse la cara, la boca, la nariz y los ojos.  Tener a H. J. Heinz su casa, como alimentos, medicamentos y productos de limpieza.  Si debe salir de su casa, use un barbijo de tela o una mascarilla facial. Asegrese de que le cubra la nariz y la boca. Cmo se transmite la enfermedad causada por el coronavirus? El virus que causa la enfermedad por coronavirus se transmite fcilmente de Neomia Dear persona a otra (es contagioso). Usted puede contagiarse con este virus:  Al inspirar las gotitas de una persona infectada. Las BJ's pueden diseminarse cuando una persona respira, habla, canta, tose o estornuda.  Al tocar algo, como una mesa o el picaportes de [place], que estuvo expuesto al  virus (contaminado) y luego tocarse la boca, nariz o los ojos. Puedo contraer al virus al tocar superficies u objetos? Todava hay mucho que no se conoce acerca del virus que causa la enfermedad por coronavirus. Los cientficos basan gran parte de la informacin en lo que saben sobre virus similares, por ejemplo:  En general, los virus no sobreviven en superficies durante mucho tiempo. Necesitan un cuerpo humano (husped) para sobrevivir.  Es ms probable que el virus se contagie por contacto cercano con personas que estn enfermas (contacto directo), por ejemplo: ? Al estrechar las manos o abrazarse. ? Al inhalar las gotitas respiratorias que se desplazan por el aire. Las BJ's pueden diseminarse cuando una persona respira, habla, canta, tose o estornuda.  Es menos probable que el virus se propague cuando una persona toca una superficie o un objeto sobre el que est el virus (contacto indirecto). El virus puede ingresar al cuerpo si la persona toca una superficie o un objeto y Express Scripts se toca la cara, los ojos, la nariz o la boca. Una persona puede contagiar el virus sin tener sntomas de la enfermedad? Puede ser posible que el virus se contagie antes de que la persona tenga sntomas de la enfermedad, pero muy probablemente esta no sea la principal forma en que el virus se est propagando. Es ms probable que el virus se propague al estar en contacto estrecho con personas que estn enfermas e inhalar las gotas respiratorias que una persona disemina al respirar, Heritage manager, cantar, toser o estornudar. Cules son los sntomas de la enfermedad causada por el coronavirus? Los sntomas  varan de Neomia Dear persona a otra y pueden variar de leves a graves. Hershey Company, se pueden incluir los siguientes:  Teacher, English as a foreign language o escalofros.  Tos.  Dificultad para respirar o falta de aire.  Dolores de [place], dolores en el cuerpo o dolores musculares.  Secrecin o congestin nasal.  El dolor de garganta.  Nueva  prdida del sentido del gusto o del olfato.  Nuseas, vmitos o diarrea. Estos sntomas pueden aparecer en el trmino de [address] a [address] despus de haber estado expuesto al virus. Algunas personas quizs no tengan sntomas. Si presenta sntomas, llame al mdico. Las personas con sntomas graves pueden necesitar atencin hospitalaria. Debo hacerme un anlisis de deteccin del virus? El mdico decidir si debe realizarse un anlisis en funcin de sus sntomas, antecedentes de exposicin y factores de [place]. Cmo realiza el mdico el anlisis para detectar este virus? Los mdicos obtienen muestras para enviar a Chiropractor. Estas muestras pueden incluir lo siguiente:  Tomar con un hisopo Lauris Poag de lquido de la parte posterior de la nariz y la garganta, la nariz o la garganta.  Pedirle que tosa mucosidad (esputo) para extraer lquido de los pulmones en un recipiente estril.  Tomar una muestra de [place]. Hay algn tratamiento o vacuna para este virus? Actualmente, no existe ninguna vacuna para prevenir la enfermedad por coronavirus. Adems, no existen Colgate Palmolive antibiticos o los antivirales para tratar el virus. Una persona que se enferma recibe tratamiento de apoyo, lo que significa reposo y lquidos. Una persona tambin puede aliviar sus sntomas con medicamentos de venta libre para tratar los estornudos, la tos y el goteo nasal. Son los mismos medicamentos que se toman para el resfro comn. Si presenta sntomas, llame al mdico. Las personas con sntomas graves pueden necesitar atencin hospitalaria. Qu puedo hacer para protegerme y proteger a mi familia de este virus?     Puede protegerse y proteger a su familia tomando las mismas medidas que tomara para prevenir el contagio de otros virus. Johnson & Johnson las siguientes medidas:  Lavarse las manos frecuentemente con agua y [place] durante al menos 20segundos. Usar desinfectante para manos con alcohol si no dispone de [place] y  [place].  Evitar tocarse la cara, la boca, la nariz y los ojos.  Toser o estornudar en un pauelo descartable, sobre su manga o codo. No toser o estornudar al aire ni cubrirse con la [place]. ? Si tose o estornuda en un pauelo de papel, deschelo inmediatamente y Verizon.  Desinfectar los TEPPCO Partners y las superficies que se tocan con frecuencia todos [place].  Aljese de Engelhard Corporation.  Evite salir de su casa, siga las indicaciones de su estado y de las autoridades sanitarias locales.  Evite los espacios interiores repletos de gente. Permanezca a una distancia de al menos 6 pies (2 m) de las Nucor Corporation.  Si debe salir de su casa, use un barbijo de tela o una mascarilla facial. Asegrese de que le cubra la nariz y la boca.  Lennie Hummer en su casa si est enfermo, excepto para obtener atencin mdica. Llame al mdico antes de buscar atencin mdica. El mdico le indicar cunto tiempo debe quedarse en casa.  Asegrese de EchoStar las vacunas al da. Pregntele al mdico qu vacunas necesita. Qu debo hacer si tengo que viajar? Siga las recomendaciones relacionadas con los viajes de la autoridad de Psychiatrist, los CDC y Engineer, civil (consulting). Informacin y consejos para Nurse, adult for Disease Control and Prevention (CDC) (Centros para el  Control y la Prevencin de Event organisernfermedades): GeminiCard.glwww.cdc.gov/coronavirus/2019-ncov/travelers/index.html  Organizacin Mundial de Radiographer, therapeuticla Salud (OMS): PreviewDomains.sewww.who.int/emergencies/diseases/novel-coronavirus-2019/travel-advice Fisher ScientificConozca los riesgos y tome medidas para proteger su salud  El riesgo de contraer la enfermedad por coronavirus es ms alto si viaja a zonas con un brote o si est en contacto con viajeros que provienen de zonas donde hay un brote.  Lvese las manos con frecuencia y Spainmantenga una higiene [place] May[place] Islandsadecuada para reducir el riesgo de contagiarse o transmitir el virus. Qu debo hacer si estoy enfermo? Instrucciones generales para detener la propagacin de la  infeccin  Lavarse las manos frecuentemente con agua y jabn durante al menos 20segundos. Usar desinfectante para manos con alcohol si no dispone de [place] y [place]jabn.  Toser o estornudar en un pauelo descartable, sobre su manga o codo. No toser o estornudar al aire ni cubrirse con la [place].  Si tose o estornuda en un pauelo de papel, deschelo inmediatamente y Verizonlvese las manos.  Lanny HurstQudese en su casa a menos que deba recibir Computer Sciences Corporationatencin mdica. Llame al mdico o a la autoridad de salud local antes de buscar atencin mdica.  Evite las zonas pblicas. No viaje en transporte pblico, de ser posible.  Si puede, use un barbijo si debe salir de la casa o si est en contacto cercano con alguien que no est enfermo. Asegrese de que le cubra la nariz y la boca. Mantenga su casa limpia  Desinfecte los objetos y las superficies que se tocan con frecuencia todos [place] Springslos das. Pueden incluir: ? Encimeras y Emorymesas. ? Picaportes e interruptores de luz. ? Lavabos, fregaderos y grifos. ? Aparatos electrnicos tales como telfonos, controles remotos, teclados, computadoras y tabletas.  Lave los platos con agua jabonosa caliente o en el lavavajillas. Deje los platos para que se sequen al aire.  Lave la ropa con agua caliente. Evite infectar a otros miembros de la familia  Permita que los miembros de la familia sanos cuiden a los nios y las Mar[place] Cliffmascotas, si es posible. Si tiene que cuidar a los nios o las mascotas, lvese las manos con frecuencia y use un barbijo.  Duerma en una habitacin o cama diferentes, si es posible.  No comparta elementos personales, como afeitadoras, cepillos de dientes, desodorantes, peines, cepillos, toallas y [address] Mellado Finaltoallitas de [address]mano. Dnde buscar ms informacin Centers for Disease Control and Prevention (CDC)  Actualizaciones de informacin y novedades: CardRetirement.czwww.cdc.gov/coronavirus/2019-ncov Organizacin Mundial de la Salud (OMS)  Actualizaciones de informacin y novedades:  AffordableSalon.eswww.who.int/emergencies/diseases/novel-coronavirus-2019  Tema de salud relacionado con el coronavirus: https://thompson-craig.com/www.who.int/health-topics/coronavirus  Preguntas y Environmental health practitionerrespuestas sobre COVID-19: kruiseway.comwww.who.int/news-room/q-a-detail/q-a-coronaviruses  Registro mundial: who.sprinklr.com American Academy of Pediatrics (AAP) (Academia Estadounidense de Pediatra)  Informacin para familias: www.healthychildren.org/English/health-issues/conditions/chest-lungs/Pages/2019-Novel-Coronavirus.aspx La situacin del coronavirus cambia rpidamente. Consulte el sitio web de su autoridad de Psychiatristsalud local o los sitios web de los CDC y la OMS para enterarse de las novedades y noticias. Cundo debo comunicarme con un mdico?  Comunquese con su mdico si tiene sntomas de infeccin, como fiebre o tos, y: ? Arlean HoppingHa estado cerca de alguien que sabe que tiene la enfermedad por coronavirus. ? Arlean HoppingHa estado en contacto con una persona que presuntamente sufra de la enfermedad por coronavirus. ? Ha viajado a una zona donde hay un brote de COVID-19. Cundo debo buscar asistencia mdica inmediata?  Busque ayuda de inmediato llamando al servicio de emergencias de su localidad (911 en los [place]) si tiene lo siguiente: ? Dificultad para respirar. ? Dolor u opresin en el pecho. ? Confusin. ? Labios y uas de Tenet Healthcarecolor azulado. ? Dificultad para  despertarse. ? Sntomas que empeoran. Informe al personal mdico de emergencias si cree que tiene la enfermedad por coronavirus. Resumen  Un nuevo virus respiratorio se propaga de Neomia Dear persona a otra y causa COVID-19 (enfermedad por coronavirus).  El virus que causa el COVID-19 parece diseminarse fcilmente. Se transmite de [place] persona a otra a travs de las YUM[employer] Brands se despiden al respirar, Heritage manager, cantar, toser o estornudar.  Los ONEOK y las personas que tienen enfermedades crnicas tienen mayor riesgo de Writer enfermedad. Si tiene un riesgo ms alto de tener  complicaciones, tome Engineer, materials.  Actualmente, no existe ninguna vacuna para prevenir la enfermedad por coronavirus. No existen medicamentos, como los antibiticos o los antivirales, para tratar el virus.  Puede protegerse y proteger a su familia al lavarse las manos con frecuencia, evitar tocarse la cara y cubrirse al toser y Engineering geologist. Esta informacin no tiene Theme park manager el consejo del mdico. Asegrese de hacerle al mdico cualquier pregunta que tenga. Document Revised: 05/06/2019 Document Reviewed: 11/09/2018 Elsevier Patient Education  2020 Elsevier Inc.   Cetoacidosis diabtica Diabetic Ketoacidosis La cetoacidosis diabtica es una complicacin grave de la diabetes. Esta afeccin aparece cuando el cuerpo no tiene la cantidad suficiente de insulina. La insulina es una hormona que regula los niveles de azcar en la [place]. Normalmente, la insulina permite que la glucosa ingrese en las clulas del cuerpo. Las clulas degradan la glucosa para Psychiatrist. Sin la insulina suficiente, el cuerpo no puede Licensed conveyancer, por lo que en su lugar degrada las grasas. Esto provoca altos niveles de glucemia y la produccin de cidos llamados cetonas. En niveles altos, las cetonas son txicas. Si la cetoacidosis diabtica no se trata, puede causar deshidratacin grave y llevar al coma o a la muerte. Cules son las causas? Esta afeccin se desarrolla cuando la falta de insulina hace que el cuerpo degrade grasas en lugar de glucosa. Esta situacin puede generarse por diversos motivos como, por ejemplo:  Neurosurgeon. Este tipo de estrs es generado por una enfermedad.  Infeccin.  Medicamentos que OfficeMax Incorporated niveles de glucemia.  No tomar medicamentos para la diabetes.  Nueva aparicin de diabetes mellitus tipo 1. Cules son los signos o los sntomas? Los sntomas de esta afeccin Baxter International siguientes:  [place].  Prdida de peso.  Sed  excesiva.  Sensacin de desvanecimiento.  Aliento con Charles Schwab a fruta o McDonald.  Miccin excesiva.  Cambios en la visin.  Confusin o irritabilidad.  Nuseas.  Vmitos.  Respiracin rpida.  Dolor abdominal.  Sensacin de sofoco. Cmo se diagnostica? Esta afeccin se diagnostica en funcin de los antecedentes mdicos, un examen fsico y [place]. Tambin pueden hacerle un anlisis de orina para Engineer, manufacturing la presencia de cetonas. Cmo se trata? El tratamiento de esta afeccin puede incluir lo siguiente:  Reposicin de lquidos. Puede realizarse como solucin para la deshidratacin.  Inyecciones de insulina. Pueden administrarse a travs de la piel o mediante un tubo (catter) intravenoso.  Reposicin de electrolitos. Los electrolitos son minerales de Risk manager. Los electrolitos, como el potasio y [place], pueden administrarse en forma de comprimidos o a travs de un tubo (catter) intravenoso.  Antibiticos. Pueden recetarle antibiticos si el trastorno se debe a una infeccin. La cetoacidosis diabtica es una enfermedad grave. Tal vez necesite tratamiento de emergencia para controlar su afeccin. Siga estas indicaciones en su casa: Comida y bebida  Beba suficiente lquido para mantener la orina clara o de color amarillo plido.  Si  no puede comer, beba lquidos claros en pequeas cantidades segn le sea posible. Los lquidos claros incluyen agua, cubitos de hielo, [place] deportivas bajas en caloras y [place] de fruta rebajado con agua (diluido). Tambin puede comer gelatina o helados de agua sin azcar.  Si puede comer, siga su dieta habitual y beba lquidos sin azcar, como agua. Medicamentos  Baxter International de venta libre y los recetados solamente como se lo haya indicado el mdico.  Contine administrndose insulina y otros medicamentos para el tratamiento de la diabetes como se lo haya indicado el mdico.  Si le recetaron un antibitico, tmelo como  se lo haya indicado el mdico. No deje de tomar los antibiticos aunque comience a Actor. Instrucciones generales   Contrlese las cetonas en la orina cuando est enfermo y como se lo haya indicado el mdico. ? Si la glucemia es superior o igual a 240mg /dl ( ), supervise las cetonas en la orina cada 4a 6horas.  Contrlese la glucemia todos los [place], con la frecuencia que le haya indicado el mdico. ? Si la glucemia es elevada, beba gran cantidad de lquidos. Esto ayuda a KALIX. ? Comunquese con el mdico si la glucemia est por encima del nivel ideal en 2anlisis seguidos.  Lleve consigo una tarjeta, o use un brazalete o una medalla que indiquen que es diabtico.  Haga reposo y ejercicios solamente como se lo haya indicado el mdico. No haga ejercicios cuando su glucemia est alta y tenga cetonas en la orina.  Si se descompone, llame al mdico y comience el tratamiento de inmediato. El cuerpo suele necesitar insulina adicional para luchar contra una enfermedad. Controle su nivel de glucemia cada 4a6horas cuando est enfermo.  Concurra a todas las visitas de control como se lo haya indicado el mdico. Esto es importante. Comunquese con un mdico si:  La glucemia est por encima de 240mg /dl ([employer]) durante 2das seguidos.  Tiene un nivel moderado o grande de cetonas en la .  Tiene fiebre.  No puede comer ni beber sin vomitar.  Ha vomitado durante ms de 2 horas.  Sigue teniendo sntomas de cetoacidosis diabtica.  Presenta nuevos sntomas. Solicite ayuda de inmediato si:  El monitor indica un nivel "alto" de glucemia incluso cuando se est administrando insulina.  Se desmaya.  Siente dolor en el pecho.  Tiene dificultad para respirar.  Tiene problemas repentinos para hablar o tragar.  Tiene vmitos o diarrea que empeoran despus de 3horas.  No puede permanecer despierto.  Tiene dificultad para pensar.  Est muy  deshidratado. Los sntomas de deshidratacin intensa incluyen lo siguiente: ? Mucha sed. ? 10,9NATF/T. ? Respiracin rpida. Estos sntomas pueden representar un problema grave que constituye [place]. No espere hasta que los sntomas desaparezcan. Solicite atencin mdica de inmediato. Comunquese con el servicio de emergencias de su localidad (911 en los [place]). No conduzca por sus propios medios [employer]. Resumen  La cetoacidosis diabtica es una complicacin grave de la diabetes. Esta afeccin aparece cuando el cuerpo no tiene la cantidad suficiente de insulina.  Esta afeccin se diagnostica en funcin de los antecedentes mdicos, un examen fsico y Radio broadcast assistant de [place]. Tambin pueden hacerle un anlisis de orina para [place] la presencia de cetonas.  La cetoacidosis diabtica es una enfermedad grave. Tal vez necesite tratamiento de emergencia para controlar su afeccin.  Comunquese con el mdico si la glucemia es superior a 240mg /dl durante 2das seguidos o si tiene [place] cantidad grande o moderada de [place] en  la [place]. Esta informacin no tiene Theme park manager el consejo del mdico. Asegrese de hacerle al mdico cualquier pregunta que tenga. Document Revised: 01/02/2017 Document Reviewed: 01/02/2017 Elsevier Patient Education  2020 [employer].   Contar carbohidratos y la diabetes  Por qu es importante el conteo de carbohidratos?  [employer] las porciones de carbohidratos ayuda a Sales executive nivel de glucosa (azcar) en su sangre para que se sienta mejor.  . El equilibrio entre los carbohidratos que come y Dietitian determina el nivel de glucosa que tendr en la sangre despus de comer.  [employer] carbohidratos tambin le ayudar a planificar sus comidas.   Qu alimentos contienen carbohidratos?  Entre los alimentos con carbohidratos se incluyen:  . Panes, galletas saladas y cereales  . Pastas, arroz y granos  . Vegetales (verduras) con almidn,  como papas, elote (maz o choclo) y chcharos (guisantes o arvejas)  . Frijoles (habichuelas) y legumbres  . Leche, leche de soya y Dentist  . Nils Pyle y jugos de fruta  . Dulces como pasteles, galletas, helados, mermeladas y jaleas   Porciones de carbohidratos  Al planificar comidas para la diabetes, recuerde que un alimento con 1 porcin de carbohidratos contiene aproximadamente 15 gramos de carbohidratos:  . Revise el tamao de las porciones con tazas y cucharas de medir o con una pesa de alimentos.  Clint Lipps los Datos de Nutricin en las etiquetas de los alimentos para saber cuntos gramos de carbohidratos contienen los alimentos que come.   Los [employer] de este folleto muestran porciones que contienen cerca de 15 gramos de carbohidratos. Copyright Academy of Nutrition and Dietetics. This handout may be duplicated for client education. Carbohydrate Counting for Diabetes (Spanish) - 2   Consejos para planificar sus comidas  . Un Plan de Alimentacin indica cuntas porciones de carbohidratos consumir en sus comidas y refrigerios (snacks). Para muchos adultos es adecuado comer 3 a 5 porciones de carbohidratos en cada comida y de 1 a 2 porciones de carbohidratos, en cada refrigerio.  . En un Plan de Alimentacin diaria saludable, la mayora de los carbohidratos provienen de:  o Al menos 6 porciones de frutas y vegetales sin almidn  o Al menos 6 porciones de Forensic scientist, frijoles y Sports administrator con almidn, con al menos 3 de estas porciones de granos integrales (enteros)  o Al menos 2 porciones de Teaching laboratory technician o productos lcteos  . Revise regularmente su nivel de glucosa en la sangre. Esto puede indicarle si necesita ajustar las horas a las que consume carbohidratos.  . [place] alimentos que contienen [place], como granos [place], y comer muy pocos alimentos salados es bueno para su salud.  . Coma 4 a 6 onzas de carne u otros alimentos con protenas (como hamburguesas de soya) cada da. Elija fuentes  de protena bajas en grasa, como carne de res y de cerdo bajas en grasa, pollo, pescado, queso bajo en grasa o alimentos vegetarianos como la soya.  . Coma algunas grasas saludables, como aceite de [place], de canola y nueces.  . Coma muy pocas grasas saturadas. Estas grasas no son saludables y se Merchandiser, retail, la crema y las carnes con mucha grasa, como el tocino (tocineta) y las salchichas o Advertising copywriter.  . Coma muy pocas o nada de grasas trans. Estas grasas no son saludables y se encuentran en todos los alimentos que contienen aceites "parcialmente hidrogenados" en su lista de ingredientes.   Consejos para leer etiquetas  En los Datos de Nutricin  de las etiquetas aparece una lista con el total de gramos de carbohidratos en una porcin estndar. La porcin estndar puede ser mayor o menor que 1 porcin de carbohidratos. Para saber cuntas porciones de carbohidratos hay en un alimento:  . Primero mire el tamao de la porcin estndar de la etiqueta.  Tia Alert verifique el total de gramos de carbohidratos. Esta es la cantidad de carbohidratos en 1 porcin estndar. Divida el total de gramos de carbohidratos por 15. Este nmero equivale al nmero de porciones de carbohidratos en 1 porcin estndar. Recuerde: 1 porcin de carbohidratos equivale a 15 gramos de carbohidratos.  . Nota: Puede ignorar los gramos de [employer] Datos de Nutricin, ya que estn incluidos en el total de gramos de carbohidratos.  Copyright Academy of Nutrition and Dietetics. This handout may be duplicated for client education. Carbohydrate Counting for Diabetes (Spanish) - 3   Listas de alimentos para el conteo de carbohidratos  1 porcin = cerca de 15 gramos de carbohidratos  Almidones  . 1 rebanada de pan (1 onza)  . 1 tortilla (6 pulgadas)  .  rosca de pan (bagel) grande (1 onza)  . 2 tortillas para taco (5 pulgadas)  .  pan para hamburguesa o para salchicha (hot dog) (3/4 onza)  .  taza de cereal listo  para comer sin endulzar  .  taza de cereal cocido  . 1 taza de sopa a base de caldo  . 4-6 galletitas saladas  . ? taza de pasta o arroz (cocidos)  .  taza de frijoles, chcharos, granos de elote, camotes (batatas, boniatos), calabaza (zapallo), pur de papas o papas hervidas (cocidos)  .  papa grande asada (3 onzas)  .  onza de pretzels, papitas o totopos (tortilla chips)  . 3 tazas de palomitas de maz Best boy) (ya preparadas)   Nils Pyle  . 1 fruta fresca pequea ( a 1 taza)  .  taza de fruta enlatada o congelada  . 17 uvas pequeas (3 onzas)  . 1 taza de meln, bayas (moras)  .  vaso de jugo de fruta  . 2 cucharadas de frutas secas (arndanos azules/blueberries, cerezas, arndanos rojos/cranberries, frutas surtidas, uvas pasas/pasitas)  Leche  . 1 taza de [employer] o reducida en grasa  . 1 taza de leche de soya  . ? taza de yogur descremado endulzado con un edulcorante sin azcar (6 onzas)   Dulces y postres  . pastel cuadrado de 2 pulgadas (sin betn/cobertura)  . 2 galletitas dulces (? onzas)  .  taza de helado o yogur congelado  .  taza de sorbete (sherbet) o nieve (sorbet)  . 1 cucharada de jarabe (sirope), mermelada, jalea, azcar o miel  . 2 cucharadas de jarabe bajo en caloras  Copyright Academy of Nutrition and Dietetics. This handout may be duplicated for client education. Carbohydrate Counting for Diabetes (Spanish) - 4   Otros alimentos  . Cuente 1 taza de vegetales crudos o  taza de vegetales sin almidn, cocidas, como porciones de alimentos con cero (0) carbohidratos o "sin restriccin." Si come 3 o ms porciones en una comida, cuntelas como 1 porcin de carbohidratos.  . Los alimentos que contienen menos de 20 caloras en cada porcin tambin pueden contarse como porciones con cero carbohidratos o alimentos "sin restriccin."  . Cuente 1 taza de guiso (estofado) u otros alimentos combinados como 2 porciones de carbohidratos.   Notas: Copyright  Academy of Nutrition and Dietetics. This handout may be duplicated for client  education. Carbohydrate Counting for Diabetes (Spanish) - 5   Contar carbohidratos y la diabetes: Ejemplo de men para 1 da [place]  1 pltano/banana pequeo (1 carbohidrato)   taza de hojuelas de maz (cornflakes) (1 carbohidrato)  1 taza de leche descremada o baja en grasa (1 carbohidrato)  1 rebanada de pan de trigo integral (1 carbohidrato)  1 cucharadita de margarina   Almuerzo  2 onzas de rebanadas de [place]  2 rebanadas de pan de trigo integral (2 carbohidratos)  2 hojas de Company secretary  4 palitos de apio  4 palitos de zanahoria  1 manzana mediana (1 carbohidrato)  1 taza de leche descremada o baja en grasa (1 carbohidrato)   Refrigerio  2 cucharadas de uvas pasas/pasitas (1 carbohidrato)   onzas de mini pretzels sin sal (1 carbohidrato)   Cena  3 onzas de carne asada de res, magra   papa grande asada (2 carbohidratos)  1 cucharada de crema agria reducida en grasa   taza de ejotes/habichuelas verdes/chauchas  1 taza de ensalada de vegetales  1 cucharada de aderezo para ensaladas reducido en caloras  1 panecillo de trigo integral (1 carbohidrato)  1 cucharadita de margarina  1 taza de bolitas de meln (1 carbohidrato)   Refrigerio  6 onzas de yogur de frutas bajo en grasa, sin azcar (1 carbohidrato)  2 cucharadas de nueces sin sal

## 2020-04-14 NOTE — Progress Notes (Signed)
   04/14/20 1022  Assess: MEWS Score  Temp 98.9 F (37.2 C)  BP (!) 148/111  Pulse Rate (!) 112  ECG Heart Rate (!) 112  Resp (!) 30  SpO2 96 %  O2 Device HFNC  O2 Flow Rate (L/min) 10 L/min  Assess: MEWS Score  MEWS Temp 0  MEWS Systolic 0  MEWS Pulse 2  MEWS RR 2  MEWS LOC 0  MEWS Score 4  MEWS Score Color Red  Assess: if the MEWS score is Yellow or Red  Were vital signs taken at a resting state? Yes  Focused Assessment No change from prior assessment  Early Detection of Sepsis Score *See Row Information* High  MEWS guidelines implemented *See Row Information* Yes  Treat  MEWS Interventions Administered scheduled meds/treatments;Administered prn meds/treatments;Consulted Respiratory Therapy  Pain Scale 0-10  Pain Score 0  Take Vital Signs  Increase Vital Sign Frequency  Red: Q 1hr X 4 then Q 4hr X 4, if remains red, continue Q 4hrs  Escalate  MEWS: Escalate Red: discuss with charge nurse/RN and provider, consider discussing with RRT  Notify: Charge Nurse/RN  Name of Charge Nurse/RN Notified [id number], RN  Date Charge Nurse/RN Notified 04/14/20  Time Charge Nurse/RN Notified 1100  Notify: Provider  Provider Name/Title Dolan Amen, MD  Date Provider Notified 04/14/20  Time Provider Notified 1210  Notification Type Page  Notification Reason Change in status;Other (Comment) (Red mews)  Response No new orders  Date of Provider Response 04/14/20  Time of Provider Response 1330  Notify: Rapid Response  Date Rapid Response Notified 04/14/20  Time Rapid Response Notified 1230  Document  Patient Outcome Other (Comment) (No new interventions, pt remains stable)  Progress note created (see row info) Yes

## 2020-04-14 NOTE — Progress Notes (Addendum)
Nutrition Education Note   RD consulted for nutrition education regarding diabetes.   Lab Results  Component Value Date   HGBA1C 12.0 (H) 04/12/2020   PTA DM medications are none.   Labs reviewed: CBGS: 256-289 (inpatient orders for glycemic control are 0-20 units insulin aspart TID with meals, 0-5 units insulin aspart daily at bedtime, 10 units insulin aspart TID with meals, and 41 units insulin glargine daily at bedtime).   Attempted to speak with pt via phone call to hospital room phone; unable to to reach.   RD provided "Carbohydrate Counting for People with Diabetes" handout from the Academy of Nutrition and Dietetics (inpreferred language of Spanish). Attached to AVS/ discharge instructions.    Body mass index is 45.64 kg/m. Pt meets criteria for obesity, class III based on current BMI. Obesity is a complex, chronic medical condition that is optimally managed by a multidisciplinary care team. Weight loss is not an ideal goal for an acute inpatient hospitalization. However, if further work-up for obesity is warranted, consider outpatient referral to outpatient bariatric service and/or [place].   Current diet order is carb modified, patient is consuming approximately 100% of meals at this time. Labs and medications reviewed. No further nutrition interventions warranted at this time. RD contact information provided. If additional nutrition issues arise, please re-consult RD.  Levada Schilling, RD, LDN, CDCES Registered Dietitian II Certified Diabetes Care and Education Specialist Please refer to [facility] for RD and/or RD on-call/weekend/after hours pager

## 2020-04-14 NOTE — Progress Notes (Signed)
Bilateral lower extremity venous duplex completed. Refer to "CV Proc" under chart review to view preliminary results.  04/14/2020 3:20 PM Eula Fried., MHA, RVT, RDCS, RDMS

## 2020-04-14 NOTE — Progress Notes (Addendum)
Subjective:   [name] states she is feeling better today. She notes feeling less short of breath and is not hypoxic with only nasal cannula as oxygen supplementation. Non-rebreather mask was sitting on the patient's bed, she noted that she did not need it at this time.   She denies chest pain, abdominal pain. She denies any difficulty eating/drinking. She denies back pain or pain/burning with urination.   She has no other complaints at the time of my examination.    Objective:  Vital signs in last 24 hours: Vitals:   04/14/20 1200 04/14/20 1300 04/14/20 1436 04/14/20 1531  BP: (!) 157/119 (!) 140/109 (!) 138/104 (!) 147/101  Pulse: (!) 117 (!) 104 (!) 105 87  Resp: (!) 26 (!) 23 (!) 24 (!) 27  Temp: 98.8 F (37.1 C) 98.6 F (37 C) 98.5 F (36.9 C) 98.4 F (36.9 C)  TempSrc: Oral Oral Oral Oral  SpO2: 94% 98% 98% 94%  Weight:      Height:        Physical Exam Vitals and nursing note reviewed.  Constitutional:      Appearance: She is obese. She is ill-appearing. She is not toxic-appearing or diaphoretic.  HENT:     Head: Normocephalic and atraumatic.     Nose:     Comments: Nasal cannula in place, 10 L    Mouth/Throat:     Comments: Able to swallow secretions without difficulty Cardiovascular:     Rate and Rhythm: Regular rhythm. Tachycardia present.     Heart sounds: No murmur heard.   Pulmonary:     Effort: Tachypnea present. No accessory muscle usage or respiratory distress.     Breath sounds: Wheezing present. No rales.     Comments: Decreased breath sounds bilaterally.  Abdominal:     General: Bowel sounds are normal. There is no distension.     Palpations: Abdomen is soft.     Tenderness: There is no abdominal tenderness. There is no guarding or rebound.  Skin:    General: Skin is warm and dry.  Neurological:     General: No focal deficit present.     Mental Status: She is alert and oriented to person, place, and time. Mental status is at baseline.    Psychiatric:        Mood and Affect: Mood normal.        Behavior: Behavior normal.    Assessment/Plan:  Principal Problem:   Pneumonia due to COVID-19 virus Active Problems:   DKA (diabetic ketoacidosis) (HCC)   E. coli bacteremia   Tonya Simmons is a 39 y.o. with pertinent PMH of T2DM, HTN  who presented with fever and admited for DKA and COVID pneumonia   # COVID-19 Pneumonia Oxygen requirements have decreased to 10 L HFNC from 15L HFNC and 15L non-rebreather. With this, she is saturating above 90%. Inflammatory markers have improved with D-dimer of 5.67 from 20 and CRP of 18.9 from 20.3. Patient with mild wheezing and decreased breath sounds on examination, no stridor appreciated. In comparison to exam from yesterday, she has improved. Due to pyelonephritis, will discontinue baricitinib as it can lead to immunosuppression and potentially worsen her pyelonephritis.   - Discontinue Baricitinib - Continue Remdesivir Day 3/5 - Continue Decadron Day 3/10 - Trend CMP, inflammatory markers - Supplemental O2 as need to keep oxygen saturation >88 % - Early ambulation, proning as tolerated, incentive spirometry - Continue Tussionex q12h PRN for cough - Continue Robitussin q4h PRN for cough -  Continue Albuterol and Atrovent PRN for wheezing   # Diabetes Ketoacidosis Secondary to medication non-compliance and infection. Patient tolerating PO diet yesterday evening, insulin drip was stopped two hours post PO intake. Beta-hydroxybutyrate acid normalized. Lantus was increased to 41 as patient was continuing to have episodes of hyperglycemia. Will continue to monitor and adjust insulin regimen as needed.   - Sliding scale insulin, patient tolerating PO intake.   [place] regimen: Lantus 41 units + Novolog 10 units TID with meals + SSI (moderate)  # E. Coli Bacteremia Pyelonephritis seen on CT a/p, suspect this is the source of patient's E.coli bacteremia at this time as no other  etiology suspected. Patient stable with no complaints of CVA tenderness. Low suspicion for complicated pyelonephritis or sepsis at this time.  - Ceftriaxone 2 g q24h, day 2 - Urinalysis yesterday with negative nitrites and leukocytes.  - Will continue to monitor for worsening pyelonephritis as well as further complications such as sepsis.   # Sinus Tachycardia Fluctuates between 110-120 at rest with increases to 130-140. Given multiple infectious processes and DKA, tachycardia is not unexpected. However, due to persistent tachycardia patient received CTA chest yesterday evening that was negative for acute pulmonary embolism.   Vascular ultrasound was performed of the lower extremities. Preliminary results state no DVT present, however, our team received a message stating that the patient did have a positive right lower extremity DVT. Despite conflicting information, patient is currently therapeutic on lovenox and will continue lovenox treatment. Will have low threshold for repeat CTA of the chest if patient becomes short of breath, tachycardic, or hypoxic. Will contact ultrasound team for further clarification.   - Continue lovenox - Continue telemetry - If patient becomes acutely short of breath, tachycardic, hypoxic, or complains of chest pain, will have low thresh-hold to repeat imaging for pulmonary embolism.  # Pneumomediastinum Noted on CT chest to be primarily in the anterior mediastinum but tracks along the thoracic inlet. Overnight team discussed with Radiology who stated this has been commonly seen with COVID-19 pneumonia. Will need to be careful with any positive pressure; high risk if patient will need bipap or intubation. No crepitus on exam.  - Continue to monitor  # Esophageal Candidiasis  # Oral Thrush - Continue fluconazole 400mg  daily for 7-14 days - Consider adding viscous Lidocaine if continued pain  # Hypertension On amlodipine 5mg  daily at home. Current bp at  110/77  - Hold home anti-hypertensives for now  # Acute Kidney Injury: Resolved   Diet: NPO VTE: Enoxaparin IVF: D5-1/2 NS Code: Full  Dispo: Pending further medical evaluation and stabilization.   DO Internal Medicine Resident, PGY-1  04/14/2020, 5:37 PM

## 2020-04-15 LAB — CBC
HCT: 34.7 % — ABNORMAL LOW (ref 36.0–46.0)
Hemoglobin: 11.1 g/dL — ABNORMAL LOW (ref 12.0–15.0)
MCH: 27.1 pg (ref 26.0–34.0)
MCHC: 32 g/dL (ref 30.0–36.0)
MCV: 84.8 fL (ref 80.0–100.0)
Platelets: 198 10*3/uL (ref 150–400)
RBC: 4.09 MIL/uL (ref 3.87–5.11)
RDW: 13.2 % (ref 11.5–15.5)
WBC: 8.3 10*3/uL (ref 4.0–10.5)
nRBC: 0 % (ref 0.0–0.2)

## 2020-04-15 LAB — CULTURE, BLOOD (ROUTINE X 2): Special Requests: ADEQUATE

## 2020-04-15 LAB — COMPREHENSIVE METABOLIC PANEL
ALT: 28 U/L (ref 0–44)
AST: 21 U/L (ref 15–41)
Albumin: 2 g/dL — ABNORMAL LOW (ref 3.5–5.0)
Alkaline Phosphatase: 97 U/L (ref 38–126)
Anion gap: 9 (ref 5–15)
BUN: 21 mg/dL — ABNORMAL HIGH (ref 6–20)
CO2: 24 mmol/L (ref 22–32)
Calcium: 8.5 mg/dL — ABNORMAL LOW (ref 8.9–10.3)
Chloride: 105 mmol/L (ref 98–111)
Creatinine, Ser: 0.71 mg/dL (ref 0.44–1.00)
GFR calc Af Amer: >60 mL/min (ref >60)
GFR calc non Af Amer: >60 mL/min (ref >60)
Glucose, Bld: 242 mg/dL — ABNORMAL HIGH (ref 70–99)
Potassium: 3.6 mmol/L (ref 3.5–5.1)
Sodium: 138 mmol/L (ref 135–145)
Total Bilirubin: 0.6 mg/dL (ref 0.3–1.2)
Total Protein: 5.9 g/dL — ABNORMAL LOW (ref 6.5–8.1)

## 2020-04-15 LAB — C-REACTIVE PROTEIN: CRP: 11.9 mg/dL — ABNORMAL HIGH (ref <1.0)

## 2020-04-15 LAB — GLUCOSE, CAPILLARY
Glucose-Capillary: 221 mg/dL — ABNORMAL HIGH (ref 70–99)
Glucose-Capillary: 245 mg/dL — ABNORMAL HIGH (ref 70–99)
Glucose-Capillary: 186 mg/dL — ABNORMAL HIGH (ref 70–99)
Glucose-Capillary: 145 mg/dL — ABNORMAL HIGH (ref 70–99)

## 2020-04-15 LAB — D-DIMER, QUANTITATIVE: D-Dimer, Quant: 5.24 ug/mL-FEU — ABNORMAL HIGH (ref 0.00–0.50)

## 2020-04-15 MED ORDER — INSULIN ASPART 100 UNIT/ML ~~LOC~~ SOLN
15.0000 [IU] | Freq: Three times a day (TID) | SUBCUTANEOUS | Status: DC
  Administered 2020-04-15 – 2020-04-18 (×10): 15 [IU] via SUBCUTANEOUS

## 2020-04-15 MED ORDER — INSULIN GLARGINE 100 UNIT/ML ~~LOC~~ SOLN
45.0000 [IU] | Freq: Every day | SUBCUTANEOUS | Status: DC
  Administered 2020-04-15 – 2020-04-17 (×3): 45 [IU] via SUBCUTANEOUS
  Filled 2020-04-15 (×4): qty 0.45

## 2020-04-15 NOTE — Progress Notes (Signed)
Subjective:   [name] was seen and evaluated at bedside this AM. Patient states that she is doing well on her high flow oxygen this morning. She states that she is feeling a little bit better than yesterday. We discussed her lab results this morning, and her continued medical course. Patient was appreciative. All questions and concerns were addressed at bedside.   Objective:  Vital signs in last 24 hours: Vitals:   04/15/20 0302 04/15/20 0438 04/15/20 0728 04/15/20 1152  BP:  (!) 115/102 (!) 155/106 (!) 149/108  Pulse:  87 86 90  Resp: (!) 23 (!) 25 (!) 21 17  Temp:  97.8 F (36.6 C) 98.2 F (36.8 C) 97.8 F (36.6 C)  TempSrc:  Oral Oral Oral  SpO2:  97% 91% 96%  Weight:      Height:       Physical Exam Constitutional:      General: She is not in acute distress.    Appearance: Normal appearance. She is not toxic-appearing or diaphoretic.     Comments: Patient sitting up straight in bed. Nonlabored breathing. Able to answer questions appropriately.   HENT:     Head: Normocephalic and atraumatic.  Cardiovascular:     Rate and Rhythm: Normal rate and regular rhythm.     Pulses: Normal pulses.     Heart sounds: Normal heart sounds. No murmur heard.  No friction rub. No gallop.   Pulmonary:     Comments: Decreased breath sounds bilaterally. Tachypneic. 10L HFNC Abdominal:     General: Abdomen is flat. Bowel sounds are normal.     Tenderness: There is no abdominal tenderness. There is no guarding.  Neurological:     Mental Status: She is alert and oriented to person, place, and time.    Assessment/Plan:  Principal Problem:   Pneumonia due to COVID-19 virus Active Problems:   DKA (diabetic ketoacidosis) (HCC)   E. coli bacteremia   Tonya Simmons is a 39 y.o. with pertinent PMH of T2DM, HTN  who presented with fever and admited for DKA and COVID pneumonia   # COVID-19 Pneumonia Oxygen requirements have decreased to 10 L HFNC. With this, she  is saturating above 90%. Inflammatory markers continue to decline. Currently on day 4 of remdesivir and decadron.    - Continue Remdesivir Day 4/5 - Continue Decadron Day 4/10 - Trend CMP, inflammatory markers - Supplemental O2 as need to keep oxygen saturation >88 % - Early ambulation, proning as tolerated, incentive spirometry - Continue Tussionex q12h PRN for cough - Continue Robitussin q4h PRN for cough - Continue Albuterol and Atrovent PRN for wheezing   # Diabetes Ketoacidosis Secondary to medication non-compliance and infection. Patient tolerating PO diet yesterday evening, insulin drip was stopped two hours post PO intake. Beta-hydroxybutyrate acid normalized. Lantus was increased to 45 as patient was continuing to have episodes of hyperglycemia. Will continue to monitor and adjust insulin regimen as needed.  - Sliding scale insulin, patient tolerating PO intake.   [place] regimen: increased Lantus 45 units + Novolog 15 units TID with meals + SSI  # E. Coli Bacteremia Pyelonephritis seen on CT a/p, suspect this is the source of patient's E.coli bacteremia at this time as no other etiology suspected. Patient stable with no complaints of CVA tenderness. Leukocytosis resolved.  - Ceftriaxone 2 g q24h, day 3/7. Sensitivities back sensitive to ceftriaxone.  # Sinus Tachycardia Fluctuates between 110-120 at rest with increases to 130-140. Given multiple infectious processes and DKA, tachycardia is  not unexpected. However, due to persistent tachycardia patient received CTA chest yesterday evening that was negative for acute pulmonary embolism. Patient's vascular U/S shows DVT in the R posterior tibial vein.  - Therapeutic Lovenox 55 mg BID - Continue telemetry - If patient becomes acutely short of breath, tachycardic, hypoxic, or complains of chest pain, will have low thresh-hold to repeat imaging for pulmonary embolism.  # Pneumomediastinum Noted on CT chest to be primarily in the anterior  mediastinum but tracks along the thoracic inlet. Overnight team discussed with Radiology who stated this has been commonly seen with COVID-19 pneumonia. Will need to be careful with any positive pressure; high risk if patient will need bipap or intubation. No crepitus on exam.  - Continue to monitor  # Esophageal Candidiasis  # Oral Thrush - Continue fluconazole 400mg  daily for 7-14 days - Consider adding viscous Lidocaine if continued pain  # Hypertension On amlodipine 5mg  daily at home. Current bp at 110/77  - Hold home anti-hypertensives for now  # Acute Kidney Injury: Resolved   Diet: NPO VTE: Enoxaparin IVF: D5-1/2 NS Code: Full  Dispo: Pending further medical evaluation and stabilization.   , MD IMTS, PGY-2 Pager: [phone] 04/15/2020,4:18 PM  After 5pm on weekdays and 1pm on weekends: On Call pager [phone]

## 2020-04-15 NOTE — Progress Notes (Signed)
Inpatient Diabetes Program Recommendations  AACE/ADA: New Consensus Statement on Inpatient Glycemic Control   Target Ranges:  Prepandial:   less than 140 mg/dL      Peak postprandial:   less than 180 mg/dL (1-2 hours)      Critically ill patients:  140 - 180 mg/dL   Results for DONINIQUE, LWIN (MRN [id number]) as of 04/15/2020 09:50  Ref. Range 04/14/2020 07:51 04/14/2020 12:29 04/14/2020 16:40 04/14/2020 21:19 04/15/2020 07:36  Glucose-Capillary Latest Ref Range: 70 - 99 mg/dL 675 (H) 449 (H) 201 (H) 311 (H) 221 (H)   Review of Glycemic Control  Diabetes history: DM2 Outpatient Diabetes medications: None Current orders for Inpatient glycemic control: Lantus 41 units QHS, Novolog 0-20 units TID with meals, Novolog 0-5 units QHS, Novolog 10 units TID with meals; Decadron 6 mg daily  Inpatient Diabetes Program Recommendations:    Insulin: If steroids continued as ordered, please consider increasing Lantus to 45 units QHS and meal coverage to Novolog 15 units TID with meals.  Thanks, Orlando Penner, RN, MSN, CDE Diabetes Coordinator Inpatient Diabetes Program [phone] (Team Pager from 8am to 5pm)

## 2020-04-15 NOTE — Progress Notes (Signed)
   04/15/20 2010  Assess: MEWS Score  Temp 98.5 F (36.9 C)  BP (!) 154/105  Pulse Rate (!) 108  ECG Heart Rate (!) 108  Resp (!) 28  Level of Consciousness Alert  SpO2 99 %  O2 Flow Rate (L/min) 8 L/min

## 2020-04-15 NOTE — Progress Notes (Signed)
   04/15/20 1631  Assess: MEWS Score  Temp 98.3 F (36.8 C)  BP (!) 155/112  Pulse Rate 95  ECG Heart Rate 98  Resp (!) 28  SpO2 100 %  Assess: MEWS Score  MEWS Temp 0  MEWS Systolic 0  MEWS Pulse 0  MEWS RR 2  MEWS LOC 0  MEWS Score 2  MEWS Score Color Yellow  Assess: if the MEWS score is Yellow or Red  Were vital signs taken at a resting state? Yes  Focused Assessment No change from prior assessment  Early Detection of Sepsis Score *See Row Information* Low  MEWS guidelines implemented *See Row Information* Yes  Treat  MEWS Interventions Escalated (See documentation below)  Pain Scale 0-10  Pain Score 0  Take Vital Signs  Increase Vital Sign Frequency  Yellow: Q 2hr X 2 then Q 4hr X 2, if remains yellow, continue Q 4hrs  Escalate  MEWS: Escalate Yellow: discuss with charge nurse/RN and consider discussing with provider and RRT  Notify: Charge Nurse/RN  Name of Charge Nurse/RN Notified Elisa, RN  Date Charge Nurse/RN Notified 04/15/20  Time Charge Nurse/RN Notified 1633  Notify: Provider  Provider Name/Title Dr. Oswaldo Done  Date Provider Notified 04/15/20  Time Provider Notified [phone]  Notify: Rapid Response  Name of Rapid Response RN Notified N/A  Document  Progress note created (see row info) Yes

## 2020-04-16 LAB — CBC
HCT: 35.3 % — ABNORMAL LOW (ref 36.0–46.0)
Hemoglobin: 11.7 g/dL — ABNORMAL LOW (ref 12.0–15.0)
MCH: 27.9 pg (ref 26.0–34.0)
MCHC: 33.1 g/dL (ref 30.0–36.0)
MCV: 84 fL (ref 80.0–100.0)
Platelets: 180 10*3/uL (ref 150–400)
RBC: 4.2 MIL/uL (ref 3.87–5.11)
RDW: 13.1 % (ref 11.5–15.5)
WBC: 4.3 10*3/uL (ref 4.0–10.5)
nRBC: 0 % (ref 0.0–0.2)

## 2020-04-16 LAB — COMPREHENSIVE METABOLIC PANEL
ALT: 28 U/L (ref 0–44)
AST: 37 U/L (ref 15–41)
Albumin: 2 g/dL — ABNORMAL LOW (ref 3.5–5.0)
Alkaline Phosphatase: 92 U/L (ref 38–126)
Anion gap: 10 (ref 5–15)
BUN: 14 mg/dL (ref 6–20)
CO2: 26 mmol/L (ref 22–32)
Calcium: 8.2 mg/dL — ABNORMAL LOW (ref 8.9–10.3)
Chloride: 100 mmol/L (ref 98–111)
Creatinine, Ser: 0.72 mg/dL (ref 0.44–1.00)
GFR calc Af Amer: >60 mL/min (ref >60)
GFR calc non Af Amer: >60 mL/min (ref >60)
Glucose, Bld: 145 mg/dL — ABNORMAL HIGH (ref 70–99)
Potassium: 3.5 mmol/L (ref 3.5–5.1)
Sodium: 136 mmol/L (ref 135–145)
Total Bilirubin: 0.5 mg/dL (ref 0.3–1.2)
Total Protein: 5.8 g/dL — ABNORMAL LOW (ref 6.5–8.1)

## 2020-04-16 LAB — D-DIMER, QUANTITATIVE: D-Dimer, Quant: 4.97 ug/mL-FEU — ABNORMAL HIGH (ref 0.00–0.50)

## 2020-04-16 LAB — GLUCOSE, CAPILLARY
Glucose-Capillary: 149 mg/dL — ABNORMAL HIGH (ref 70–99)
Glucose-Capillary: 289 mg/dL — ABNORMAL HIGH (ref 70–99)
Glucose-Capillary: 336 mg/dL — ABNORMAL HIGH (ref 70–99)
Glucose-Capillary: 262 mg/dL — ABNORMAL HIGH (ref 70–99)

## 2020-04-16 LAB — C-REACTIVE PROTEIN: CRP: 6.6 mg/dL — ABNORMAL HIGH (ref <1.0)

## 2020-04-16 MED ORDER — APIXABAN 5 MG PO TABS
10.0000 mg | ORAL_TABLET | Freq: Two times a day (BID) | ORAL | Status: DC
  Administered 2020-04-16 – 2020-04-18 (×5): 10 mg via ORAL
  Filled 2020-04-16 (×5): qty 2

## 2020-04-16 MED ORDER — APIXABAN 5 MG PO TABS: 5.0000 mg | ORAL_TABLET | Freq: Two times a day (BID) | ORAL | Status: DC

## 2020-04-16 MED ORDER — NYSTATIN 100000 UNIT/ML MT SUSP
5.0000 mL | Freq: Four times a day (QID) | OROMUCOSAL | Status: DC
  Administered 2020-04-16 – 2020-04-18 (×10): 500000 [IU] via ORAL
  Filled 2020-04-16 (×8): qty 5

## 2020-04-16 NOTE — Progress Notes (Signed)
ANTICOAGULATION CONSULT NOTE - Initial Consult  Pharmacy Consult for Apixaban Indication: DVT  No Known Allergies  Patient Measurements: Height: 5' (152.4 cm) Weight: 106 kg (233 lb 11 oz) IBW/kg (Calculated) : 45.5  Vital Signs: Temp: 98.4 F (36.9 C) (10/03 1100) Temp Source: Oral (10/03 1100) BP: 134/92 (10/03 1100) Pulse Rate: 91 (10/03 1100)  Labs: Recent Labs    04/14/20 0650 04/14/20 0650 04/15/20 0500 04/16/20 0500  HGB 11.1*   < > 11.1* 11.7*  HCT 33.6*  --  34.7* 35.3*  PLT 194  --  198 180  CREATININE 0.77  --  0.71 0.72   < > = values in this interval not displayed.    Estimated Creatinine Clearance: 104.9 mL/min (by C-G formula based on SCr of 0.72 mg/dL).   Medical History: Past Medical History:  Diagnosis Date  . Diabetes mellitus without complication (HCC)   . Hypertension in pregnancy, antepartum 12/11/2011   Borderline dx: no antenatal testing at this time (01/23/12)    Assessment: 39 years of age female with COVID on Lovenox therapy now to transition to Apixaban therapy with R-distal DVT. Last dose of Lovenox was at 0600 AM at 0.5 mg/kg dose. CBC is stable for admission. D-dimer is trending down. CRP trending down. No bleeding noted.   Patient is on fluconazole therapy - DDI, contacted Dr. Sande Brothers - changing patient to nystatin oral suspension for esophageal candidiasis and oral thrush to help avoid this major drug interaction.   Goal of Therapy:  Monitor platelets by anticoagulation protocol: Yes   Plan:  Apixaban 10mg  po BID x7 days then on Sunday 04/23/20 reduce to 5mg  po BID.  Monitor CBC and for any signs of bleeding.   06/23/20, PharmD, BCPS, BCCCP Clinical Pharmacist Please refer to [facility] for [facility] numbers 04/16/2020,12:55 PM

## 2020-04-16 NOTE — Progress Notes (Addendum)
Subjective:   Patient seen and evaluated at bedside today. She states that she is doing well, and breathing better. We discussed her lab results today. All questions and concerns addressed at bedside.    Objective:  Vital signs in last 24 hours: Vitals:   04/15/20 1755 04/15/20 2010 04/16/20 0000 04/16/20 0405  BP: (!) 155/117 (!) 154/105 (!) 160/110 (!) 155/108  Pulse: 100 (!) 108 (!) 102 (!) 105  Resp: (!) 25 (!) 28 (!) 24 (!) 28  Temp: 98.7 F (37.1 C) 98.5 F (36.9 C) 98.6 F (37 C) 98.8 F (37.1 C)  TempSrc: Oral Oral Oral Oral  SpO2: 95% 99% 100% 99%  Weight:      Height:       Physical Exam Vitals and nursing note reviewed.  Constitutional:      General: She is not in acute distress.    Appearance: Normal appearance. She is not ill-appearing or toxic-appearing.  HENT:     Head: Normocephalic and atraumatic.  Cardiovascular:     Rate and Rhythm: Normal rate and regular rhythm.     Pulses: Normal pulses.     Heart sounds: Normal heart sounds. No murmur heard.  No friction rub. No gallop.   Pulmonary:     Comments: Non tachypneic, unlabored breathing.  Abdominal:     General: Abdomen is flat. Bowel sounds are normal.     Tenderness: There is no abdominal tenderness. There is no guarding.  Musculoskeletal:     Right lower leg: No edema.     Left lower leg: No edema.  Skin:    General: Skin is warm.  Neurological:     Mental Status: She is alert and oriented to person, place, and time.  Psychiatric:        Mood and Affect: Mood normal.        Behavior: Behavior normal.     Assessment/Plan:  Principal Problem:   Pneumonia due to COVID-19 virus Active Problems:   DKA (diabetic ketoacidosis) (HCC)   E. coli bacteremia   Tonya Simmons is a 39 y.o. with pertinent PMH of T2DM, HTN  who presented with fever and admited for DKA and COVID pneumonia   # COVID-19 Pneumonia Oxygen requirements have decreased to 6L HFNC. With this, she is  saturating above 90%. Inflammatory markers continue to decline.  - Finished 5 day course of remdesivir today - Continue Decadron while admitted - Supplemental O2 as need to keep oxygen saturation >88 % - Early ambulation, proning as tolerated, incentive spirometry - Continue Tussionex q12h PRN for cough - Continue Robitussin q4h PRN for cough - Continue Albuterol and Atrovent PRN for wheezing   # Diabetes Ketoacidosis Secondary to medication non-compliance and infection. Now resolved after insulin infusion.  - Sliding scale insulin, patient tolerating PO intake.   [place] regimen: increased Lantus 45 units + Novolog 15 units TID with meals + SSI  # E. Coli Bacteremia due to likely Pyelonephritis Pyelonephritis seen on CT a/p, suspect this is the source of patient's E.coli bacteremia at this time as no other etiology suspected. Patient stable with no complaints of CVA tenderness. Leukocytosis resolved.  - Ceftriaxone 2 g q24h, day 4/7. Sensitivities back sensitive to ceftriaxone. Can transition to oral cefdinir for discharge when ready  #Provoked DVT of Right Posterior Tibial Vein This is a distal provoked DVT. Anticoagulation considered optional for these, but given covid19 I would favor 3 months of anticoagulation. Will switch lovenox to a DOAC today.  - Therapeutic  Lovenox 55 mg BID to doac today in anticipation of discharge.   # Esophageal Candidiasis  # Oral Thrush - Continue fluconazole 400mg  daily for 7-14 days - Consider adding viscous Lidocaine if continued pain  # Hypertension On amlodipine 5mg  daily at home.   Diet: Carb modified VTE: Enoxaparin IVF: D5-1/2 NS Code: Full  Dispo: Pending further medical evaluation and stabilization.   For questions or orders, please call: , MD IMTS, PGY-2 Pager: [phone] 04/16/2020,6:32 AM  After 5pm on weekdays and 1pm on weekends: On Call pager [phone]

## 2020-04-16 NOTE — Plan of Care (Signed)
  Problem: Education: Goal: Knowledge of risk factors and measures for prevention of condition will improve Outcome: Progressing   Problem: Respiratory: Goal: Will maintain a patent airway Outcome: Progressing   Problem: Clinical Measurements: Goal: Diagnostic test results will improve Outcome: Progressing Goal: Respiratory complications will improve Outcome: Progressing   Problem: Nutrition: Goal: Adequate nutrition will be maintained Outcome: Progressing   Problem: Education: Goal: Ability to describe self-care measures that may prevent or decrease complications (Diabetes Survival Skills Education) will improve Outcome: Progressing   Problem: Health Behavior/Discharge Planning: Goal: Ability to identify and utilize available resources and services will improve Outcome: Progressing Goal: Ability to manage health-related needs will improve Outcome: Progressing   Problem: Metabolic: Goal: Ability to maintain appropriate glucose levels will improve Outcome: Progressing

## 2020-04-17 DIAGNOSIS — J1282 Pneumonia due to coronavirus disease 2019: Secondary | ICD-10-CM

## 2020-04-17 LAB — COMPREHENSIVE METABOLIC PANEL
ALT: 30 U/L (ref 0–44)
AST: 29 U/L (ref 15–41)
Albumin: 2.2 g/dL — ABNORMAL LOW (ref 3.5–5.0)
Alkaline Phosphatase: 86 U/L (ref 38–126)
Anion gap: 8 (ref 5–15)
BUN: 22 mg/dL — ABNORMAL HIGH (ref 6–20)
CO2: 26 mmol/L (ref 22–32)
Calcium: 8.8 mg/dL — ABNORMAL LOW (ref 8.9–10.3)
Chloride: 102 mmol/L (ref 98–111)
Creatinine, Ser: 0.74 mg/dL (ref 0.44–1.00)
GFR calc Af Amer: >60 mL/min (ref >60)
GFR calc non Af Amer: >60 mL/min (ref >60)
Glucose, Bld: 235 mg/dL — ABNORMAL HIGH (ref 70–99)
Potassium: 4.4 mmol/L (ref 3.5–5.1)
Sodium: 136 mmol/L (ref 135–145)
Total Bilirubin: 0.4 mg/dL (ref 0.3–1.2)
Total Protein: 6 g/dL — ABNORMAL LOW (ref 6.5–8.1)

## 2020-04-17 LAB — BASIC METABOLIC PANEL
Anion gap: 12 (ref 5–15)
BUN: 24 mg/dL — ABNORMAL HIGH (ref 6–20)
CO2: 24 mmol/L (ref 22–32)
Calcium: 9.6 mg/dL (ref 8.9–10.3)
Chloride: 100 mmol/L (ref 98–111)
Creatinine, Ser: 1.02 mg/dL — ABNORMAL HIGH (ref 0.44–1.00)
GFR calc Af Amer: >60 mL/min (ref >60)
GFR calc non Af Amer: >60 mL/min (ref >60)
Glucose, Bld: 291 mg/dL — ABNORMAL HIGH (ref 70–99)
Potassium: 5.1 mmol/L (ref 3.5–5.1)
Sodium: 136 mmol/L (ref 135–145)

## 2020-04-17 LAB — CULTURE, BLOOD (ROUTINE X 2)
Culture: NO GROWTH
Special Requests: ADEQUATE

## 2020-04-17 LAB — CBC
HCT: 36.7 % (ref 36.0–46.0)
HCT: 41.8 % (ref 36.0–46.0)
Hemoglobin: 12.2 g/dL (ref 12.0–15.0)
Hemoglobin: 13.6 g/dL (ref 12.0–15.0)
MCH: 28.4 pg (ref 26.0–34.0)
MCH: 27.7 pg (ref 26.0–34.0)
MCHC: 33.2 g/dL (ref 30.0–36.0)
MCHC: 32.5 g/dL (ref 30.0–36.0)
MCV: 85.5 fL (ref 80.0–100.0)
MCV: 85.1 fL (ref 80.0–100.0)
Platelets: 200 10*3/uL (ref 150–400)
Platelets: 258 10*3/uL (ref 150–400)
RBC: 4.29 MIL/uL (ref 3.87–5.11)
RBC: 4.91 MIL/uL (ref 3.87–5.11)
RDW: 13.2 % (ref 11.5–15.5)
RDW: 13 % (ref 11.5–15.5)
WBC: 6.3 10*3/uL (ref 4.0–10.5)
WBC: 6.5 10*3/uL (ref 4.0–10.5)
nRBC: 0 % (ref 0.0–0.2)
nRBC: 0 % (ref 0.0–0.2)

## 2020-04-17 LAB — GLUCOSE, CAPILLARY
Glucose-Capillary: 224 mg/dL — ABNORMAL HIGH (ref 70–99)
Glucose-Capillary: 334 mg/dL — ABNORMAL HIGH (ref 70–99)
Glucose-Capillary: 341 mg/dL — ABNORMAL HIGH (ref 70–99)
Glucose-Capillary: 186 mg/dL — ABNORMAL HIGH (ref 70–99)

## 2020-04-17 LAB — C-REACTIVE PROTEIN: CRP: 4.5 mg/dL — ABNORMAL HIGH (ref <1.0)

## 2020-04-17 LAB — D-DIMER, QUANTITATIVE: D-Dimer, Quant: 3.45 ug/mL-FEU — ABNORMAL HIGH (ref 0.00–0.50)

## 2020-04-17 NOTE — Plan of Care (Signed)
  Problem: Education: Goal: Knowledge of risk factors and measures for prevention of condition will improve Outcome: Progressing   Problem: Respiratory: Goal: Will maintain a patent airway Outcome: Progressing   Problem: Clinical Measurements: Goal: Diagnostic test results will improve Outcome: Progressing Goal: Respiratory complications will improve Outcome: Progressing   Problem: Nutrition: Goal: Adequate nutrition will be maintained Outcome: Progressing   Problem: Education: Goal: Ability to describe self-care measures that may prevent or decrease complications (Diabetes Survival Skills Education) will improve Outcome: Progressing   Problem: Health Behavior/Discharge Planning: Goal: Ability to identify and utilize available resources and services will improve Outcome: Progressing Goal: Ability to manage health-related needs will improve Outcome: Progressing   Problem: Metabolic: Goal: Ability to maintain appropriate glucose levels will improve Outcome: Progressing   

## 2020-04-17 NOTE — Progress Notes (Addendum)
Subjective:   [name] is doing well this morning. She states she is breathing well on 2L O2 nasal cannula. She denies feeling short of breath while eating or walking to and from bathroom on the oxygen supplementation. She denies chest pain. She denies back pain or dysuria. She states she is ready to be discharged home.   It was also discussed with the patient that she is still eligible to receive the COVID vaccine and that patient's can be reinfected with COVID despite having antibodies up to 3 months after. We recommended she receive the vaccine prior to the three month period.   She has no other complaints at this time.   Objective:  Vital signs in last 24 hours: Vitals:   04/16/20 2150 04/16/20 2203 04/17/20 0035 04/17/20 0440  BP:   (!) 131/96 (!) 152/111  Pulse:   73 86  Resp:   17 20  Temp:   97.9 F (36.6 C) 98.2 F (36.8 C)  TempSrc:   Oral Oral  SpO2: 99% 93% 98% 96%  Weight:      Height:       Physical Exam Vitals and nursing note reviewed.  Constitutional:      Appearance: Normal appearance.     Comments: Nasal cannula  Cardiovascular:     Rate and Rhythm: Normal rate and regular rhythm.     Pulses: Normal pulses.     Heart sounds: Normal heart sounds. No murmur heard.  No friction rub. No gallop.   Pulmonary:     Effort: Pulmonary effort is normal. No respiratory distress.     Breath sounds: Normal breath sounds.  Skin:    General: Skin is warm and dry.  Neurological:     General: No focal deficit present.     Mental Status: She is alert and oriented to person, place, and time.  Psychiatric:        Mood and Affect: Mood normal.        Behavior: Behavior normal.    Assessment/Plan:  Principal Problem:   Pneumonia due to COVID-19 virus Active Problems:   DKA (diabetic ketoacidosis) (HCC)   E. coli bacteremia  Tonya Simmons a 39 y.o.with pertinent PMH of T2DM, HTNwho presented with feverand admited for DKA and COVID  pneumonia. Patient continuing to require oxygen therapy, 2L nasal cannula. Because of this continued oxygen requirement, believe patient would benefit from continued steroid therapy.   Because of this, along with patient's DKA during admission, would like to continue to monitor patient in the hospital. Will continue to monitor oxygenation and glucose levels. Prior to discharge will ambulate patient and assess her O2 saturation. Will consider discharge tomorrow if patient continues to improve.   # COVID-19 Pneumonia Oxygen requirements have decreased to 2L HFNC. With this, she is saturating above 90%. Inflammatory markers continue to decline, CRP of 4.5 today from 20.3. WBC of 6.3.  - Finished 5 day course of remdesivir today - Continue Decadron while admitted - Supplemental O2 as need to keep oxygen saturation >88 % - Early ambulation, proning as tolerated, incentive spirometry - Continue Tussionex q12h PRN for cough - Continue Robitussin q4h PRN for cough - Continue Albuterol and Atrovent PRN for wheezing  # Diabetes Ketoacidosis Secondary to medication non-compliance and infection. Now resolved after insulin infusion. PO diet, tolerating well. Patient on decadron for COVID infection, will continue to monitor effects of steroid on glucose levels.  - Sliding scale insulin, patient tolerating PO intake. - [place] regimen: Lantus  45 units + Novolog 15 units TID with meals + SSI  # E. Coli Bacteremia due to likely Pyelonephritis Pyelonephritis seen on CT a/p, suspect this is the source of patient's E.coli bacteremia at this time as no other etiology suspected. Patient stable with no complaints of CVA tenderness. Leukocytosis resolved.  - Ceftriaxone 2 g q24h, day 5/7. Sensitivities back sensitive to ceftriaxone. Can transition to oral cefdinir for discharge when ready  #Provoked DVT of Right Posterior Tibial Vein This is a distal provoked DVT. Anticoagulation considered optional for these, but  given covid19 I would favor 3 months of anticoagulation. Switched from lovenox to [place]  # Esophageal Candidiasis  # Oral Thrush - Continue fluconazole 400mg  daily for 7-14 days - Consider adding viscous Lidocaine if continued pain  # Hypertension On amlodipine 5mg  daily at home.  Diet:Carb modified IVF: D5-1/2 NS Code:Full Prior to Admission Living Arrangement: Home Anticipated Discharge Location: Home Barriers to Discharge: O2 Supplementation, Steroid In Light of Diabetes Dispo: Anticipated discharge in approximately 1-2 day(s).   , MD 04/17/2020, 6:27 AM

## 2020-04-18 ENCOUNTER — Other Ambulatory Visit (HOSPITAL_COMMUNITY): Payer: Self-pay | Admitting: Emergency Medicine

## 2020-04-18 LAB — BASIC METABOLIC PANEL
Anion gap: 13 (ref 5–15)
BUN: 26 mg/dL — ABNORMAL HIGH (ref 6–20)
CO2: 25 mmol/L (ref 22–32)
Calcium: 9.6 mg/dL (ref 8.9–10.3)
Chloride: 100 mmol/L (ref 98–111)
Creatinine, Ser: 0.89 mg/dL (ref 0.44–1.00)
GFR calc non Af Amer: >60 mL/min (ref >60)
Glucose, Bld: 224 mg/dL — ABNORMAL HIGH (ref 70–99)
Potassium: 4.2 mmol/L (ref 3.5–5.1)
Sodium: 138 mmol/L (ref 135–145)

## 2020-04-18 LAB — GLUCOSE, CAPILLARY
Glucose-Capillary: 123 mg/dL — ABNORMAL HIGH (ref 70–99)
Glucose-Capillary: 213 mg/dL — ABNORMAL HIGH (ref 70–99)
Glucose-Capillary: 354 mg/dL — ABNORMAL HIGH (ref 70–99)

## 2020-04-18 MED ORDER — NOVOLIN 70/30 (70-30) 100 UNIT/ML ~~LOC~~ SUSP: SUBCUTANEOUS | 3 refills | Status: DC

## 2020-04-18 MED ORDER — APIXABAN 5 MG PO TABS
5.0000 mg | ORAL_TABLET | Freq: Two times a day (BID) | ORAL | 2 refills | Status: DC
Start: 2020-04-26 — End: 2020-10-10

## 2020-04-18 MED ORDER — APIXABAN 5 MG PO TABS: 5.0000 mg | ORAL_TABLET | Freq: Two times a day (BID) | ORAL | 2 refills | Status: DC

## 2020-04-18 MED ORDER — APIXABAN 5 MG PO TABS: 10.0000 mg | ORAL_TABLET | Freq: Two times a day (BID) | ORAL | 0 refills | Status: DC

## 2020-04-18 MED ORDER — GUAIFENESIN-DM 100-10 MG/5ML PO SYRP: 5.0000 mL | ORAL_SOLUTION | ORAL | 0 refills | Status: DC | PRN

## 2020-04-18 MED ORDER — CEFDINIR 300 MG PO CAPS: 300.0000 mg | ORAL_CAPSULE | Freq: Two times a day (BID) | ORAL | 0 refills | Status: AC

## 2020-04-18 MED ORDER — APIXABAN 5 MG PO TABS: 10.0000 mg | ORAL_TABLET | Freq: Two times a day (BID) | ORAL | 2 refills | Status: DC

## 2020-04-18 MED ORDER — CEFDINIR 300 MG PO CAPS: 300.0000 mg | ORAL_CAPSULE | Freq: Two times a day (BID) | ORAL | 0 refills | Status: DC

## 2020-04-18 MED ORDER — APIXABAN 5 MG PO TABS
10.0000 mg | ORAL_TABLET | Freq: Two times a day (BID) | ORAL | 0 refills | Status: DC
Start: 2020-04-18 — End: 2020-04-18

## 2020-04-18 NOTE — Progress Notes (Signed)
SATURATION QUALIFICATIONS: (This note is used to comply with regulatory documentation for home oxygen)  Patient Saturations on Room Air at Rest =94%  Patient Saturations on Room Air while Ambulating = 90%  Patient Saturations on0 Liters of oxygen while Ambulating =90%  Please briefly explain why patient needs home oxygen: 

## 2020-04-18 NOTE — Care Management (Deleted)
Benefits check sent for Eliquis 5 mg BID and Eliquis 10 mg BID.

## 2020-04-18 NOTE — TOC Transition Note (Signed)
Transition of Care [facility]) - CM/SW Discharge Note   Patient Details  Name: Tonya Simmons MRN: [id number] Date of Birth: 12/11/1980  [facility] CM/SW Contact:  Nance Pear, RN Phone Number: 04/18/2020, 4:21 PM   Clinical Narrative:    Case manager noted need for medication assistance. >Patient has appointment with COVID clinic then [facility] >Eliquis card sent to room and explained to significant other [facility] letter sent to patient room for discharge and hard copy of Eliquis relayed to [facility]    Final next level of care: Home w Home Health Services Barriers to Discharge: No Barriers Identified   Patient Goals and CMS Choice Patient states their goals for this hospitalization and ongoing recovery are:: to go home      Discharge Placement                       Discharge Plan and Services   Discharge Planning Services: CM Consult                                 Social Determinants of Health (SDOH) Interventions     Readmission Risk Interventions No flowsheet data found.

## 2020-04-18 NOTE — Progress Notes (Signed)
Inpatient Diabetes Program Recommendations  AACE/ADA: New Consensus Statement on Inpatient Glycemic Control (2015)  Target Ranges:  Prepandial:   less than 140 mg/dL      Peak postprandial:   less than 180 mg/dL (1-2 hours)      Critically ill patients:  140 - 180 mg/dL   Lab Results  Component Value Date   GLUCAP 213 (H) 04/18/2020   HGBA1C 12.0 (H) 04/12/2020    Review of Glycemic Control  Inpatient Diabetes Program Recommendations:   Received consult regarding insulin dosing for discharge home. Discussed with Dr. Mikey Bussing and plans to discontinue Decadron prior to discharge home. Consider: Novolog [place] insulin 70/30 15 units bid ac breakfast & supper. Secure chat with RN Dickie La and Dr. Mikey Bussing.  Thank you, Billy Fischer. Jearlene Bridwell, RN, MSN, CDE  Diabetes Coordinator Inpatient Glycemic Control Team Team Pager [phone] (8am-5pm) 04/18/2020 3:20 PM

## 2020-04-18 NOTE — Progress Notes (Addendum)
Subjective:   Spanish interpretor was used during this encounter.   Ms. Dianey states she is feeling better today. She is sitting up in the recliner without oxygen supplementation. She is saturating over 90% and is in no respiratory distress. She denies feeling short of breath when walking to and from the bathroom. She denies chest pain. She states she is ready to be discharged.   She also enquired about when she can receive the covid vaccine and it was relayed to the patient that she has formed immunity to covid, however, this does not prevent her from still becoming reinfected. It was recommended she receive the vaccine within the next three months. The patient voiced understanding.   Objective:  Vital signs in last 24 hours: Vitals:   04/18/20 0016 04/18/20 0400 04/18/20 0851 04/18/20 1206  BP: (!) 121/96 130/90 (!) 126/96 (!) 144/105  Pulse: 86 80 100 97  Resp: 20 (!) 21 (!) 22 (!) 23  Temp: 97.9 F (36.6 C) 97.8 F (36.6 C) 97.9 F (36.6 C) 98.1 F (36.7 C)  TempSrc: Oral Oral Oral Oral  SpO2: 94% 96% 99% 97%  Weight:      Height:       Physical Exam Vitals and nursing note reviewed.  Constitutional:      General: She is not in acute distress.    Appearance: Normal appearance. She is not ill-appearing, toxic-appearing or diaphoretic.  Cardiovascular:     Rate and Rhythm: Normal rate and regular rhythm.     Pulses: Normal pulses.     Heart sounds: Normal heart sounds. No murmur heard.  No friction rub. No gallop.   Pulmonary:     Effort: Pulmonary effort is normal. No respiratory distress.     Breath sounds: Normal breath sounds.  Neurological:     General: No focal deficit present.     Mental Status: She is alert and oriented to person, place, and time. Mental status is at baseline.  Psychiatric:        Mood and Affect: Mood normal.        Behavior: Behavior normal.     CBC Latest Ref Rng & Units 04/17/2020 04/17/2020 04/16/2020  WBC 4.0 - 10.5 K/uL 6.5 6.3 4.3    Hemoglobin 12.0 - 15.0 g/dL 03.2 12.2 11.7(L)  Hematocrit 36 - 46 % 41.8 36.7 35.3(L)  Platelets 150 - 400 K/uL 258 200 180     BMP Latest Ref Rng & Units 04/18/2020 04/17/2020 04/17/2020  Glucose 70 - 99 mg/dL 482(N) 003(B) 048(G)  BUN 6 - 20 mg/dL 89(V) 69(I) 50(T)  Creatinine 0.44 - 1.00 mg/dL 8.88 2.80(K) 3.49  Sodium 135 - 145 mmol/L 138 136 136  Potassium 3.5 - 5.1 mmol/L 4.2 5.1 4.4  Chloride 98 - 111 mmol/L 100 100 102  CO2 22 - 32 mmol/L 25 24 26   Calcium 8.9 - 10.3 mg/dL 9.6 9.6 )     Assessment/Plan:  Principal Problem:   Pneumonia due to COVID-19 virus Active Problems:   DKA (diabetic ketoacidosis) (HCC)   E. coli bacteremia Setareh Espinoza-Palominois a 39 y.o.with pertinent PMH of T2DM, HTNwho presented with feverand admited for DKA and COVID pneumonia. Patient no longer requiring oxygen supplementation and no longer in respiratory distress. Will message clinic to have follow up appointment scheduled for the patient.   # COVID-19 Pneumonia Oxygen requirements have decreased to room air from 2L HFNC yesterday. With this, she is saturating above 90%. Inflammatory markers continued to decline as well as  white blood cell count.  - Finished 5 day course of remdesivir  - Continue Decadronwhile admitted, discontinued at discharge - Oxygen saturation >88 % on room air.  #Diabetes Ketoacidosis Patient unable to afford diabetes medications per prior documentation and infection.Now resolved after insulin infusion. PO diet, tolerating well. Patient on decadron for COVID infection, will continue to monitor effects of steroid on glucose levels.   - Diabetes regimen during admission of   Sliding scale insulin, patient tolerating PO intake.   [place] regimen: Lantus 45 units + Novolog 15 units TID with meals + SSI - Diabetes coordinator to meet with patient prior to discharge.   # E. Coli Bacteremiadue to likely Pyelonephritis Pyelonephritis seen on CT a/p, suspect  this is the source of patient's E.coli bacteremia at this time as no other etiology suspected. Patient stable with no complaints of CVA tenderness. Leukocytosis resolved.  - Ceftriaxone 2 g q24h, day6/7. Sensitivities back sensitive to ceftriaxone.Will transition to one dose of oral cefdinir  #Provoked DVT of Right Posterior Tibial Vein This is a distal provoked DVT. Anticoagulation considered optional for these, but given covid19 I would favor 3 months of anticoagulation. Switched from lovenox to [place]  # Esophageal Candidiasis  # Oral Thrush - Nystatin used during hospitalization.   # Hypertension On amlodipine 5mg  daily at home.  Diet: Carb modified IV Fluids: None DVT Prophylaxis: Eliquis Code Status: Full  Prior to Admission Living Arrangement: Home Anticipated Discharge Location: Home Barriers to Discharge: None Dispo: Anticipated discharge today   , MD Internal Medicine Resident, PGY-1 Pager: [phone] 04/18/2020, 2:46 PM  After 5pm on weekdays and 1pm on weekends: On Call pager [phone]

## 2020-04-18 NOTE — Progress Notes (Signed)
Tonya Simmons to be D/C'd home per MD order. Discussed with the patient (via interpreter) and all questions fully answered.   VVS, Skin clean, dry and intact without evidence of skin break down, no evidence of skin tears noted.  IV catheter discontinued intact. Site without signs and symptoms of complications. Dressing and pressure applied.  An After Visit Summary was printed and given to the patient.  Patient escorted via WC, and D/C home via private auto.  Dickie La  04/18/2020 6:15 PM

## 2020-04-19 ENCOUNTER — Encounter: Payer: Self-pay | Admitting: *Deleted

## 2020-04-19 NOTE — Progress Notes (Unsigned)
HOSPITAL FOLLOWUP APPOINTMENT 04/25/20.

## 2020-04-20 ENCOUNTER — Telehealth: Payer: Self-pay | Admitting: Student

## 2020-04-20 MED FILL — ELIQUIS 5 MG TABLET: 5 | 30 days supply | Qty: 60 | Fill #0

## 2020-04-20 NOTE — Telephone Encounter (Signed)
TOC HFU APPT [facility] ON 04/25/2020 @ 2:15 PM  WITH DR SPEAKMAN PER DR Einar Crow

## 2020-04-21 NOTE — Telephone Encounter (Signed)
Transition Care Management Unsuccessful Follow-up Telephone Call  Date of discharge and from where:  10/5/2021From [facility]  Attempts:  1st Attempt  Reason for unsuccessful TCM follow-up call:  Left voice message Via [address], [place] [phone] with HFU appt date/time and where our clinic is located and our call back number. Kinnie Feil, BSN, RN-BC

## 2020-04-23 NOTE — Discharge Summary (Addendum)
Name: Tonya Simmons MRN: [id number] DOB: 15-Feb-1981 39 y.o. PCP: Patient, No Pcp Per  Date of Admission: 04/12/2020  1:45 PM Date of Discharge: 04/18/2020 Attending Physician: Dr. Angelia Mould  Discharge Diagnosis:    Pneumonia due to COVID-19 virus   DKA (diabetic ketoacidosis) ([place])  Sepsis due to  E. coli bacteremia 2/2 Pyleonephritis  Discharge Medications: Allergies as of 04/18/2020   No Known Allergies      Medication List     STOP taking these medications    famotidine 20 MG tablet Commonly known as: PEPCID   loperamide 2 MG capsule Commonly known as: IMODIUM   ondansetron 4 MG disintegrating tablet Commonly known as: Zofran ODT       TAKE these medications    amLODipine 5 MG tablet Commonly known as: NORVASC Take 1 tablet (5 mg total) by mouth daily for 30 days.   apixaban 5 MG Tabs tablet Commonly known as: ELIQUIS Take 2 tablets (10 mg total) by mouth 2 (two) times daily for 7 days.   apixaban 5 MG Tabs tablet Commonly known as: ELIQUIS Take 1 tablet (5 mg total) by mouth 2 (two) times daily. Start taking on: April 26, 2020   guaiFENesin-dextromethorphan 100-10 MG/5ML syrup Commonly known as: ROBITUSSIN DM Take 5 mLs by mouth every 4 (four) hours as needed for cough.   NovoLIN 70/30 (70-30) 100 UNIT/ML injection Generic drug: insulin NPH-regular Human 15 units once in the morning, once in the evening.       ASK your doctor about these medications    cefdinir 300 MG capsule Commonly known as: OMNICEF Take 1 capsule (300 mg total) by mouth 2 (two) times daily for 1 day. Ask about: Should I take this medication?        Disposition and follow-up:   [name] was discharged from [facility] in Stable condition.  At the hospital follow up visit please address:  1.  Follow-up:  A. Dyspnea    B. Diabetes Managment   C. DVT Right Posterior Tibial Vein   D. Oral Thrush/Esophageal  Candidiasis   2.  Labs / imaging needed at time of follow-up: None  3.  Pending labs/ test needing follow-up: None  Follow-up Appointments:  Follow-up [facility]. Go on 04/26/2020.   Specialty: Family Medicine Why: Appointment made for [facility] on 04/26/20 at 9:30 am.  YOU MUST ATTEND THIS APPOINTMENT Contact information: [address] [employer] [place] Hospital Course by problem list:  1. Acute Hypoxic Respiratory Failure Secondary To COVID Pneumonia  The patient presented to the ED on 09/29 with dyspnea worse with exertion,fevers and non-productive cough. She denied sick contacts and was unvaccinated for COVID. Upon admission, the patient was saturating 92% on 5 L nasal cannula with a respiratory rate of 42. Upon examination she was found to have strioder, tachypnea, bilateral crackles without wheezing. She was diaphoretic with poor skin turgor. Her inflammatory markers were elevated; CRP 17, LDH 437. Abg results; pH 7.48, pCO2 23.1, pO2 51, bicarb 17.2. delta pH 0.08, delta pCo2 17 consistent w/ acute respiratory alkalosis with concurrent gap metabolic acidosis secondary to diabetic ketoacidosis. X-ray imaging revealed multi-focal opacities. She was found to be COVID 19 positive. Solumedrol was administered while in the ED and she was admitted to the internal medicine team for treatment of her respiratory failure secondary  to COVID pneumonia. She was considered high risk for decompensation considering her concurrent diabetic ketoacidosis. Critical care team was consulted who recommended medical floor admission.  The patient was started on baricitinb, remdesivir, and dexamethasone. Her oxygen requirements increased to 8L HFNC which eventually increased further to 15L HFNC with overlying non-rebreather at 15L. She continued to have stridor with wheezing. Her inflammatory markers increased; CRP of  20 and d-dimer of 20. Her respiratory distress improved with the increased respiratory support, bronchodilators, cough medicine, and treatment with dexamethasone, remdesivir, and baractinib. She was subsequently found to have a pneumomediastinum that was not suspected as contributing to her respiratory distress.   Her oxygen requirements began decreasing to 10 L HFNC, with this she was saturating above 90%. Her inflammatory markers continued to improve and her respiratory exam improved to mild wheezing with decreased breath sounds. Baricitinb was discontinued in light of the patient being found to have pyelonephritis. She completed 5 days of remdesivir treatment, continued decadron treatment, and her oxygen requirements decreased to 6L HFNC and she continued to saturate above 90%. The following days her oxygen requirements decreased to 2L HFNC and her respiratory status continued to improve. The day of her discharge the patient had O2 saturation of 93-94% on room air, comfortably conversing with [place] in her recliner. She was able to ambulate to the restroom without a drop in her oxygen saturation, this was witnessed by the nursing staff. The decadron was discontinued and the patient was discharged home with follow up in the [facility] clinic.   2. Diabetic Ketoacidosis  Upon admission patient did endorse feelings of malaise. Her glucose was found to be 563 with a bicarb of 17, anion gap of 18, betahydroxybuytrate of 4. As such, she met diagnostic criteria for diabetic ketoacidosis. The patient has a history of uncontrolled diabetes and did not have a PCP due to being uninsured, thus she was not taking her medications regularly. She had prior emergency department visits for hyperglycemia. Suspected etiology was combination of both the patient's uncontrolled diabetes and COVID infection. Additionally she was found to have a potassium of 3.0. She was started on insulin drip, IV KCl for potassium supplementation, and  placed on normal saline at 150 mL/hr until blood glucose less than 250 and would be switched to D5/half normal saline. Blood glucose levels were drawn hourly and and BMET's were performed every 4 hours.   On hospital day 2, the patient had significant improvement of her DKA. She was taken off of the insulin drip and placed on subcutaneous insulin (Lantus 33 units + Novolog 10 units TID with meals + SSI) after her anion gap closed twice and her beta-hydroxybutyrate normalized. She tolerated a PO diet without complications. Her lantus was increased to 45 and novolog to 15 units as she continued to have episodes of hyperglycemia. The patient continued to have frequent glucose checks because of the decadron given for his concurrent COVID infection. Stable at discharge and was sent home on home medications with follow up with [facility] clinic.   3. Sepsis due to E. coli Bacteremia 2/2 Pyelonephritis  Blood cultures were positive for E.coli bacteremia, suspected source being pyelonephritis as seen on CT scan. No other etiology found. Patient was treated with ceftriaxone. Sensitives confirmed bacteria was susceptible to ceftriaxone, was transitioned to one dose of cefdinir at discharge.   4. Deep Vein Thrombosis  Patient was found to have transient episodes of tachycardia during her hospital admission and because of her multiple risk factors for a  DVT, lower extremity ultrasound was performed as well as CTA of the chest. Ultrasound revealed a right lower extremity DVT. CT chest was negative for a pulmonary embolism. Patient was continued on lovenox treatment. Distal provoked DVT's normally not treated, but in light of hypercoagulable state with COVID infections, she was switched to a DOAC at discharge and planned to be treated for 3 months. She will follow up with the [facility] clinic.   5. Pneumomediastinum  Noted to have pneumomediastinum on CT chest. No crepitus on examination. Repeat imaging showed continuous  resolution. Patient remained asymptotic and will follow up with [facility] clinic to assure the area did not worsen.   6. Esophageal Candidaiss & Oral Thrush  Patient with odynophagia and neck pain upon admission. Evidence of thrush on oral mucosa thought to be due to esophageal candidosis in light of uncontrolled diabetes. Received nystatin oral suspension and fluconazole IV during her hospital admission.   Discharge Vitals:   BP (!) 141/110   Pulse 97   Temp 98.1 F (36.7 C) (Oral)   Resp (!) 23   Ht 5' (1.524 m)   Wt 106 kg   SpO2 97%   BMI 45.64 kg/m   Pertinent Labs, Studies, and Procedures:  CBC Latest Ref Rng & Units 04/17/2020 04/17/2020 04/16/2020  WBC 4.0 - 10.5 K/uL 6.5 6.3 4.3  Hemoglobin 12.0 - 15.0 g/dL 13.6 12.2 11.7(L)  Hematocrit 36 - 46 % 41.8 36.7 35.3(L)  Platelets 150 - 400 K/uL 258 200 180    CMP Latest Ref Rng & Units 04/18/2020 04/17/2020 04/17/2020  Glucose 70 - 99 mg/dL 224(H) 291(H) 235(H)  BUN 6 - 20 mg/dL 26(H) 24(H) 22(H)  Creatinine 0.44 - 1.00 mg/dL 0.89 1.02(H) 0.74  Sodium 135 - 145 mmol/L 138 136 136  Potassium 3.5 - 5.1 mmol/L 4.2 5.1 4.4  Chloride 98 - 111 mmol/L 100 100 102  CO2 22 - 32 mmol/L '25 24 26  ' Calcium 8.9 - 10.3 mg/dL 9.6 9.6 8.8(L)  Total Protein 6.5 - 8.1 g/dL - - 6.0(L)  Total Bilirubin 0.3 - 1.2 mg/dL - - 0.4  Alkaline Phos 38 - 126 U/L - - 86  AST 15 - 41 U/L - - 29  ALT 0 - 44 U/L - - 30   CT SOFT TISSUE NECK WO CONTRAST  Result Date: 04/12/2020 CLINICAL DATA:  COVID-19. EXAM: CT NECK WITHOUT CONTRAST TECHNIQUE: Multidetector CT imaging of the neck was performed following the standard protocol without intravenous contrast. COMPARISON:  None. FINDINGS: PHARYNX AND LARYNX: The nasopharynx, oropharynx and larynx are normal. Visible portions of the oral cavity, tongue base and floor of mouth are normal. Normal epiglottis, vallecula and pyriform sinuses. The larynx is normal. No retropharyngeal abscess, effusion or lymphadenopathy.  SALIVARY GLANDS: Normal parotid, submandibular and sublingual glands. THYROID: Normal. LYMPH NODES: No enlarged or abnormal density lymph nodes. VASCULAR: Major cervical vessels are patent. LIMITED INTRACRANIAL: Normal. VISUALIZED ORBITS: Normal. MASTOIDS AND VISUALIZED PARANASAL SINUSES: No fluid levels or advanced mucosal thickening. No mastoid effusion. SKELETON: No bony spinal canal stenosis. No lytic or blastic lesions. UPPER CHEST: Bilateral peripheral predominant ground-glass airspace opacities. OTHER: None. IMPRESSION: 1. Normal epiglottis.  No acute abnormality of the neck. 2. Bilateral peripheral predominant ground-glass airspace opacities, consistent with viral pneumonia. Electronically Signed   By: Ulyses Jarred M.D.   On: 04/12/2020 21:49   CT CHEST WO CONTRAST  Result Date: 04/12/2020 CLINICAL DATA:  Pneumonia, effusion or abscess suspected, COVID-19 positive EXAM: CT CHEST WITHOUT CONTRAST TECHNIQUE:  Multidetector CT imaging of the chest was performed following the standard protocol without IV contrast. COMPARISON:  Radiograph 04/12/2020 FINDINGS: Motion degraded imaging. Cardiovascular: Limited evaluation the absence of contrast media. Normal heart size. No pericardial effusion. No clear pericardial gas is seen to suggest pneumopericardium. The aorta is normal caliber. Central pulmonary arteries are normal caliber. Luminal evaluation of the vasculature precluded in the absence of contrast media. Mediastinum/Nodes: Pneumoperitoneum predominantly within the anterior mediastinum and tracking along the proximal great vessels into the thoracic inlet. Findings in the neck are better detailed on dedicated CT performed independently. No mediastinal fluid is seen. No discernible acute abnormality or abnormal thickening of the esophagus. Trachea is unremarkable. Thyroid gland is normal. Lungs/Pleura: Extensive respiratory motion artifact. There heterogeneous multifocal areas of mixed consolidative and  ground-glass opacity including multiple areas of more nodular consolidation in the bilateral lungs. Chronic elevation of the right hemidiaphragm is somewhat more pronounced than on comparison though this may be related to atelectatic changes which are present as well. Diffuse airways thickening is suspected. No visible pneumothorax. Upper Abdomen: No acute abnormalities present in the visualized portions of the upper abdomen. Musculoskeletal: Mild degenerative changes in the spine. No acute or worrisome chest wall lesions. IMPRESSION: 1. Severely motion degraded imaging. 2. Multifocal areas of mixed consolidative and ground-glass opacity including multiple areas of more nodular consolidation in the bilateral lungs, concerning for multifocal pneumonia in the setting of COVID-19 positivity. 3. Pneumoperitoneum predominantly within the anterior mediastinum and tracking along the proximal great vessels into the thoracic inlet. No gross abnormality of the trachea or esophagus is seen within the limitations of this unenhanced, motion degraded exam. This may be related to barotrauma underlying airspace disease particularly as the incidence of spontaneous pneumomediastinum has been shown to have up to a 7 fold increase in patients diagnosed with COVID 19. 4. Chronic elevation of the right hemidiaphragm is somewhat more pronounced than on comparison though this may be related to atelectatic changes which are present as well. Electronically Signed   By: Lovena Le M.D.   On: 04/12/2020 21:53   CT ANGIO CHEST PE W OR WO CONTRAST  Result Date: 04/13/2020 CLINICAL DATA:  Abdominal pain, fever, PE suspected, positive D-dimer EXAM: CT ANGIOGRAPHY CHEST WITH CONTRAST TECHNIQUE: Multidetector CT imaging of the chest was performed using the standard protocol during bolus administration of intravenous contrast. Multiplanar CT image reconstructions and MIPs were obtained to evaluate the vascular anatomy. CONTRAST:  12m  OMNIPAQUE IOHEXOL 350 MG/ML SOLN COMPARISON:  Contemporary CT of the abdomen and pelvis, CT chest 04/12/2020 FINDINGS: Cardiovascular: Satisfactory opacification the pulmonary arteries to the segmental level. No pulmonary artery filling defects are identified. Central pulmonary arteries are top normal caliber. Normal heart size. No pericardial effusion. No convincing evidence of pneumopericardium. The aortic root is suboptimally assessed given cardiac pulsation artifact. The aorta is normal caliber. No acute luminal abnormality of the imaged aorta. No periaortic stranding or hemorrhage. Shared origin of the brachiocephalic and left common carotid arteries. Proximal great vessels are otherwise unremarkable. Mediastinum/Nodes: Small foci of pneumomediastinum predominantly along the anterior and left middle mediastinum. No mediastinal fluid. No acute abnormality of the trachea or esophagus. Thyroid gland and thoracic inlet is unremarkable. No worrisome mediastinal, hilar or axillary adenopathy. Few scattered subcentimeter low-attenuation mediastinal and hilar nodes are likely reactive. Lungs/Pleura: Heterogeneous areas of mixed consolidative and ground-glass opacity are present again throughout both lungs with minimal interval change from the comparison 1 day prior. Redemonstrated elevation of the right hemidiaphragm.  No pneumothorax or effusion. Upper Abdomen: Dedicated CT of the abdomen and pelvis was performed and dictated separately, please see report for further details. Appearance on these chest images suggests the presence of hepatic steatosis and layering biliary sludge and or gallstones within the gallbladder. Additional asymmetric right perinephric stranding and pelvic fullness, could be seen with ascending tract infection or passage of a calculus. Musculoskeletal: Multilevel degenerative changes in the spine. No acute or worrisome osseous lesions. Review of the MIP images confirms the above findings.  IMPRESSION: 1. No evidence of acute pulmonary artery embolism. 2. Heterogeneous areas of mixed consolidative and ground-glass opacity throughout both lungs with minimal interval change from the comparison 1 day prior, concerning for a multifocal pneumonia including potential atypical viral etiologies such as COVID-19. 3. Few persistent small foci of pneumomediastinum similar to slightly decreased from comparison, of uncertain etiology but possibly reflecting a spontaneous pneumothorax in the setting of COVID-19 with detailed discussion on comparison CT. 4. Dedicated CT of the abdomen and pelvis was performed, please see report for further details. Electronically Signed   By: Lovena Le M.D.   On: 04/13/2020 21:47   CT ABDOMEN PELVIS W CONTRAST  Result Date: 04/13/2020 CLINICAL DATA:  Abdominal pain and fever. EXAM: CT ABDOMEN AND PELVIS WITH CONTRAST TECHNIQUE: Multidetector CT imaging of the abdomen and pelvis was performed using the standard protocol following bolus administration of intravenous contrast. CONTRAST:  136m OMNIPAQUE IOHEXOL 350 MG/ML SOLN COMPARISON:  None. FINDINGS: Lower chest: Patchy bilateral airspace opacities at the lung bases. Upper normal heart size. Hepatobiliary: Diffusely decreased hepatic density consistent with steatosis. No evidence of focal hepatic lesion. Heterogeneous density in the gallbladder which may represent sludge or noncalcified stones. There is no biliary dilatation. Pancreas: Mild motion artifact through the distal pancreas. No ductal dilatation or inflammation. Spleen: Normal in size without focal abnormality. Adrenals/Urinary Tract: No adrenal nodule. There is heterogeneous enhancement of the right kidney with mild perinephric edema. There is mild proximal periureteric stranding. No evidence of renal fluid collection. Small cyst in the mid upper left kidney. No hydronephrosis. Partially distended urinary bladder with mild wall thickening about the dome.  Stomach/Bowel: Ingested material within the stomach. There is no gastric wall thickening. No evidence of small bowel obstruction or inflammatory change. Normal appendix. Minimal liquid stool in the proximal colon. There is formed stool in the more distal colon. No colonic wall thickening or pericolonic edema. There is stool in the rectum. Vascular/Lymphatic: Normal caliber abdominal aorta. Intact IVC. No bulky abdominopelvic adenopathy. Reproductive: Uterus and bilateral adnexa are unremarkable. Other: Small fat containing umbilical hernia. No ascites. No free air. No evidence of intra-abdominal abscess. Musculoskeletal: There are no acute or suspicious osseous abnormalities. Transitional lumbosacral anatomy. Sclerotic density in the right proximal femur is typical of bone island. IMPRESSION: 1. Heterogeneous enhancement of the right kidney with mild perinephric edema and proximal periureteric stranding, suspicious for pyelonephritis. No renal fluid collection. 2. Mild wall thickening about the dome of the urinary bladder, can be seen with cystitis. 3. Hepatic steatosis. 4. Heterogeneous density in the gallbladder may represent sludge or noncalcified stones. Electronically Signed   By: MKeith RakeM.D.   On: 04/13/2020 21:43   DG Chest Port 1 View  Result Date: 04/12/2020 CLINICAL DATA:  Shortness of breath. EXAM: PORTABLE CHEST 1 VIEW COMPARISON:  September 15, 2018. FINDINGS: The heart size and mediastinal contours are within normal limits. No pneumothorax or effusion is noted. Patchy airspace opacities are noted in both lungs, left  greater than right, consistent multifocal pneumonia. The visualized skeletal structures are unremarkable. IMPRESSION: Bilateral multifocal pneumonia, left greater than right. Electronically Signed   By: Marijo Conception M.D.   On: 04/12/2020 14:41     Discharge Instructions: Discharge Instructions     Call MD for:  difficulty breathing, headache or visual disturbances    Complete by: As directed    Call MD for:  persistant dizziness or light-headedness   Complete by: As directed    Call MD for:  persistant nausea and vomiting   Complete by: As directed    Call MD for:  redness, tenderness, or signs of infection (pain, swelling, redness, odor or green/yellow discharge around incision site)   Complete by: As directed    Call MD for:  severe uncontrolled pain   Complete by: As directed    Call MD for:  temperature >100.4   Complete by: As directed    Diet - low sodium heart healthy   Complete by: As directed    Diet Carb Modified   Complete by: As directed    Increase activity slowly   Complete by: As directed        Signed: Riesa Pope, MD 04/23/2020, 7:58 PM   Pager: [phone]

## 2020-04-25 ENCOUNTER — Ambulatory Visit (INDEPENDENT_AMBULATORY_CARE_PROVIDER_SITE_OTHER): Payer: HRSA Program | Admitting: Student

## 2020-04-25 ENCOUNTER — Other Ambulatory Visit: Payer: Self-pay

## 2020-04-25 ENCOUNTER — Encounter: Payer: Self-pay | Admitting: Student

## 2020-04-25 ENCOUNTER — Telehealth: Payer: Self-pay | Admitting: *Deleted

## 2020-04-25 DIAGNOSIS — B370 Candidal stomatitis: Secondary | ICD-10-CM | POA: Diagnosis not present

## 2020-04-25 DIAGNOSIS — U071 COVID-19: Secondary | ICD-10-CM | POA: Diagnosis not present

## 2020-04-25 DIAGNOSIS — E1310 Other specified diabetes mellitus with ketoacidosis without coma: Secondary | ICD-10-CM

## 2020-04-25 DIAGNOSIS — E1365 Other specified diabetes mellitus with hyperglycemia: Secondary | ICD-10-CM | POA: Diagnosis not present

## 2020-04-25 DIAGNOSIS — E119 Type 2 diabetes mellitus without complications: Secondary | ICD-10-CM

## 2020-04-25 DIAGNOSIS — E1110 Type 2 diabetes mellitus with ketoacidosis without coma: Secondary | ICD-10-CM | POA: Insufficient documentation

## 2020-04-25 DIAGNOSIS — B9620 Unspecified Escherichia coli [E. coli] as the cause of diseases classified elsewhere: Secondary | ICD-10-CM

## 2020-04-25 DIAGNOSIS — N12 Tubulo-interstitial nephritis, not specified as acute or chronic: Secondary | ICD-10-CM | POA: Diagnosis not present

## 2020-04-25 DIAGNOSIS — K760 Fatty (change of) liver, not elsewhere classified: Secondary | ICD-10-CM

## 2020-04-25 DIAGNOSIS — E1159 Type 2 diabetes mellitus with other circulatory complications: Secondary | ICD-10-CM | POA: Insufficient documentation

## 2020-04-25 DIAGNOSIS — R7881 Bacteremia: Secondary | ICD-10-CM | POA: Insufficient documentation

## 2020-04-25 DIAGNOSIS — J1282 Pneumonia due to coronavirus disease 2019: Secondary | ICD-10-CM

## 2020-04-25 DIAGNOSIS — I82461 Acute embolism and thrombosis of right calf muscular vein: Secondary | ICD-10-CM | POA: Insufficient documentation

## 2020-04-25 DIAGNOSIS — I152 Hypertension secondary to endocrine disorders: Secondary | ICD-10-CM | POA: Insufficient documentation

## 2020-04-25 DIAGNOSIS — I10 Essential (primary) hypertension: Secondary | ICD-10-CM

## 2020-04-25 MED ORDER — NOVOLIN 70/30 (70-30) 100 UNIT/ML ~~LOC~~ SUSP: SUBCUTANEOUS | 3 refills | Status: DC

## 2020-04-25 MED ORDER — NYSTATIN 100000 UNIT/ML MT SUSP: 5.0000 mL | Freq: Four times a day (QID) | OROMUCOSAL | 0 refills | Status: DC

## 2020-04-25 NOTE — Telephone Encounter (Signed)
Pt has an in-person appt today @ 1415 PM; Only 14 days post COVID (dx 9/29 per Epic). Discussed with Dr Oswaldo Done (Attending) - stated appt needs to be changed to a telehealth appt. Called pt via [employer], Paula Compton [phone]. Talked to pt - informed we will be changing her appt today to a telehealth appt, explained the doctor will be calling her, not to come to our office. Stated she understands. Also informed to keep her appt tomorrow at the COVID ctr on [address]. She asked for the address which was given including the telephone#.

## 2020-04-25 NOTE — Assessment & Plan Note (Addendum)
Patient was diagnosed via blood cultures with E. Coli bacteremia on recent admission, thought to be due to pyelonephritis identified on CT scan. There was also questionable cystitis. She was treated with ceftriaxone in the hospital and completed one day of cefdinir as outpatient. She denies any fevers, chills, nausea, vomiting, back or abdominal pain, dysuria, hematuria, or other symptoms.  - Check CBC with differential at follow up appointment  - Check urinalysis next visit if patient is symptomatic.

## 2020-04-25 NOTE — Patient Instructions (Signed)
Patient was given verbal instructions to increase Novalin from 15 units to 20 units twice daily. She was also prescribed nystatin oral suspension to use until her thrush resolves. She was told to try NSAIDs vs tylenol for throat pain and told that [facility] will call her to schedule an in person follow up visit after 05/03/20.   Dr. Laddie Aquas

## 2020-04-25 NOTE — Assessment & Plan Note (Signed)
Patient was incidentally noted to have hepatic steatosis on CT imaging 04/13/20. Triglycerides elevated during admission, although in the setting of COVID-19 infection.  - Patient will require lipid profile once COVID-19 resolves

## 2020-04-25 NOTE — Assessment & Plan Note (Signed)
Patient notes a history of uncontrolled type II DM and states she was not taking glucose-lowering therapy at home. She was found to be in DKA on recent admission with glucose 563, bicarb 17, anion gap 18, and beta hydroxybutyrate ~4. She required Lantus 45 units and Novolog 15 units three times daily while in the hospital, in the setting of concurrent Dexamethasone for COVID-19 pneumonia. She was discharged on Novolin 15 units twice daily, morning and evenings. She states she takes this medication regularly without missing doses. She states she checks her sugar once daily first thing in the morning, ranging from 200-250, without any symptoms of hypoglycemia. Patient denies polydipsia, polyuria, N/V, Abd pain, fatigue. She endorses mild generalized weakness but states this has improved since admission. No blurry vision or LE numbness/tingling.  - Will increase Novolin 15 units twice daily to 20 units twice daily - Reminded patient to take doses 15 minutes prior to breakfast and dinner, respectively  - Reminded patient to continue checking her glucose regularly. - Patient instructed to call if any symptoms of hypoglycemia and to carry snacks as backup - Will follow up when outside Covid window (after 05/03/20)

## 2020-04-25 NOTE — Assessment & Plan Note (Signed)
Patient tested positive for COVID-19 and was found to have multifocal PNA on recent admission. She received remdesivir x 5 days, dexamethasone, and some baricitinib (due to 15L HFNC and 15L NRB O2 demand) although this was discontinued due to concurrent pyelonephritis. Complicated by small pneumomediastinum. She was discharged, stable with O2 saturations 93-94% on room air, with Robitussin. She denies any fevers or chills and states that her dry cough has improved. She endorses sore throat secondary to cough but denies any SOB and is able to walk around without difficulty. Patient has not been vaccinated and denies any history of underlying lung disease.  - Continue to quarantine until 05/03/20  - Will have patient follow up in clinic after this time - continue Robitussin PRN for cough - Encouraged NSAID/tylenol use for throat pain PRN - consider repeat CXR, CBC w/ diff at next appointment

## 2020-04-25 NOTE — Progress Notes (Signed)
Internal Medicine Clinic Attending  Case discussed with Dr. Laddie Aquas at the time of the visit.  We reviewed the resident's history and exam and pertinent patient test results.  I spoke with the patient by phone and agree with the assessment, diagnosis, and plan of care documented in the resident's note.  Jessy Oto, M.D., Ph.D.

## 2020-04-25 NOTE — Assessment & Plan Note (Signed)
See "E. Coli Bacteremia".

## 2020-04-25 NOTE — Assessment & Plan Note (Signed)
Patient was found to have RLE DVT during admission. She states her venous discoloration in her legs has resolved and denies swelling or pain. She has no provoking factors and no known history of DVT/PE. CTA testing last admission was negative. She was started on Lovenox in the hospital and transitioned to Apixaban 10mg  twice daily x 7 days. - Instructed patient to take 10mg  Apixaban tonight then transition to Apixaban 5mg  twice daily starting tomorrow, for a total of 3 months

## 2020-04-25 NOTE — Assessment & Plan Note (Signed)
Patient endorses a history of HTN. Patient had previously been prescribed amlodipine 5mg  in the past, although states she had not been taking this and was not prescribed anything for HTN during admission for discharge. - Will require follow up at next visit.

## 2020-04-25 NOTE — Progress Notes (Signed)
[facility] Internal Medicine Residency Telephone Encounter Continuity Care Appointment  HPI:   This telephone encounter was created for [name] on 04/25/2020 for the following purpose/cc: hospital follow up.  [name] is a 39 y.o. obese, Spanish-speaking female with history of uncontrolled DM and HTN, with telehealth appointment to follow up her recent hospitalization. She was admitted to [facility] 04/12/20-04/18/20 for COVID-19 pneumonia, found to be in DKA with E. Coli bacteremia 2/2 suspected pyelonephritis, RLE DVT, oral thrush and suspected esophageal candidiasis 2/2 uncontrolled sugars, and small pneumomediastinum. She states she has finished the one day of cefdinir for her bacteremia, continues to take Apixaban 10mg  twice daily for her DVT, and has been taking Novolin 15 units in the morning and 15 units in the evening. She states she has checked her blood sugars first thing in the morning (only) and her sugars have ranged from 200-250. She denies any hypoglycemic readings or symptoms. She states that her cough has improved, and continues to be dry, associated with sore throat. Her thrush is still present but significantly improved, and she endorses throat pain with coughing only, not with swallowing. She has been taking Robitussin regularly but has not taken any NSAIDs/Tylenol for her pain and says she was not discharged with medication for her thrush. She denies any fevers, chills, SOB, CP, palpitations, nausea, vomiting, polydipsia, polyuria, abdominal pain, dysuria, lower back pain, hematuria. She states the red veins of her RLE have resolved and she denies any leg pain, swelling, or discoloration. She denies bleeding or any other symptoms.    Past Medical History:  Past Medical History:  Diagnosis Date  . Acute deep vein thrombosis (DVT) of calf muscle vein of right lower extremity (HCC)   . Diabetes mellitus without complication (HCC)   .  Diabetic ketoacidosis (HCC)   . E coli bacteremia   . Hepatic steatosis   . Hypertension   . Hypertension in pregnancy, antepartum 12/11/2011   Borderline dx: no antenatal testing at this time (01/23/12)   . Oral candidiasis   . Pneumonia due to COVID-19 virus   . Pyelonephritis      ROS:   All others negative except as noted above in HPI.   Assessment / Plan / Recommendations:   Please see A&P under problem oriented charting for assessment of the patient's acute and chronic medical conditions.   As always, pt is advised that if symptoms worsen or new symptoms arise, they should go to an urgent care facility or to to ER for further evaluation.   Consent and Medical Decision Making:   Patient seen with Dr. 03/25/12  This is a telephone encounter between Tonya Simmons and Sandre Kitty on 04/25/2020 for hospital follow up. The visit was conducted with the patient located at home and 06/25/2020 at [facility]. The patient's identity was confirmed using their DOB and current address. The patient has consented to being evaluated through a telephone encounter and understands the associated risks (an examination cannot be done and the patient may need to come in for an appointment) / benefits (allows the patient to remain at home, decreasing exposure to coronavirus). I personally spent 30 minutes on medical discussion.    [facility] were used throughout entire phone call with patient.   I connected with  Tonya Simmons on 04/25/20 by a video enabled telemedicine application and verified that I am speaking with the correct person using two identifiers.   I discussed the limitations of evaluation and  management by telemedicine. The patient expressed understanding and agreed to proceed.  Glenford Bayley, MD 04/25/2020, 3:53 PM Pager: [phone]

## 2020-04-25 NOTE — Assessment & Plan Note (Signed)
Patient was diagnosed with oral candidiasis during her recent admission, likely due to uncontrolled blood sugars in the setting of infection. It was thought her throat pain could be due to esophageal candidiasis. She received nystatin oral suspension and fluconazole IV. She states her thrush is still present although significantly improved. Her throat pain continues although she believes this is secondary to cough and denies pain with swallowing or dysphagia.  - Will prescribe nystatin oral suspension for continued use at home until thrush resolves

## 2020-04-26 ENCOUNTER — Ambulatory Visit (INDEPENDENT_AMBULATORY_CARE_PROVIDER_SITE_OTHER): Payer: HRSA Program | Admitting: Nurse Practitioner

## 2020-04-26 VITALS — BP 138/82 | HR 103 | Temp 97.7°F | Wt 228.0 lb

## 2020-04-26 DIAGNOSIS — U071 COVID-19: Secondary | ICD-10-CM | POA: Diagnosis not present

## 2020-04-26 DIAGNOSIS — J1282 Pneumonia due to coronavirus disease 2019: Secondary | ICD-10-CM

## 2020-04-26 NOTE — Progress Notes (Signed)
@Patient  ID: , female    DOB: [birth year], 39 y.o.   MRN: [id number]  Chief Complaint  Patient presents with  . Hospitalization Follow-up    COVID Pos: 9/29 Hosp: 9/29-10/6 Sx: Slight cough. [facility]    Referring provider: No ref. provider found   39 year old female with history of DVT, hypertension, diabetes, hypothyroidism.  Diagnosed with Covid on 04/12/2020.  HPI  Patient presents today for post COVID care clinic visit.  She was hospitalized from [id number] and 621.  Patient states that she has been doing well since hospital discharge.  She denies any significant shortness of breath.  She has followed up with her primary care physician for diabetic management. She states that she does have a follow-up scheduled with her primary care for lab work and follow-up chest x-ray.  Patient states that she is compliant with Eliquis.  She was found to have a DVT to the right lower extremity.  Chest CT was negative for pulmonary embolism. Denies f/c/s, n/v/d, hemoptysis, PND, chest pain or edema.        No Known Allergies  Immunization History  Administered Date(s) Administered  . Influenza Split 08/29/2011  . Influenza,inj,Quad PF,6+ Mos 05/03/2013  . Tdap 02/20/2012, 06/17/2013    Past Medical History:  Diagnosis Date  . Acute deep vein thrombosis (DVT) of calf muscle vein of right lower extremity (HCC)   . Diabetes mellitus without complication (HCC)   . Diabetic ketoacidosis (HCC)   . E coli bacteremia   . Hepatic steatosis   . Hypertension   . Hypertension in pregnancy, antepartum 12/11/2011   Borderline dx: no antenatal testing at this time (01/23/12)   . Oral candidiasis   . Pneumonia due to COVID-19 virus   . Pyelonephritis     Tobacco History: Social History   Tobacco Use  Smoking Status Never Smoker  Smokeless Tobacco Never Used   Counseling given: Not Answered   Outpatient Encounter  Medications as of 04/26/2020  Medication Sig  . apixaban (ELIQUIS) 5 MG TABS tablet Take 1 tablet (5 mg total) by mouth 2 (two) times daily.  04/28/2020 guaiFENesin-dextromethorphan (ROBITUSSIN DM) 100-10 MG/5ML syrup Take 5 mLs by mouth every 4 (four) hours as needed for cough.  . insulin NPH-regular Human (NOVOLIN 70/30) (70-30) 100 UNIT/ML injection 20 units once in the morning, once in the evening.  09-02-1971 apixaban (ELIQUIS) 5 MG TABS tablet Take 2 tablets (10 mg total) by mouth 2 (two) times daily for 7 days.  Marland Kitchen nystatin (MYCOSTATIN) 100000 UNIT/ML suspension Take 5 mLs (500,000 Units total) by mouth 4 (four) times daily. (Patient not taking: Reported on 04/26/2020)   No facility-administered encounter medications on file as of 04/26/2020.     Review of Systems  Review of Systems  Constitutional: Negative.   HENT: Negative.   Respiratory: Negative for cough and shortness of breath.   Cardiovascular: Negative.   Gastrointestinal: Negative.   Allergic/Immunologic: Negative.   Neurological: Negative.   Psychiatric/Behavioral: Negative.        Physical Exam  BP 138/82 (BP Location: Right Arm)   Pulse (!) 103   Temp 97.7 F (36.5 C)   Wt 228 lb 0.1 oz (103.4 kg)   SpO2 91%   BMI 44.53 kg/m   Wt Readings from Last 5 Encounters:  04/26/20 228 lb 0.1 oz (103.4 kg)  04/13/20 233 lb 11 oz (106 kg)  03/10/20 246 lb (111.6 kg)  03/10/20 248 lb  12.8 oz (112.9 kg)  07/11/14 253 lb 1.6 oz (114.8 kg)     Physical Exam Vitals and nursing note reviewed.  Constitutional:      General: She is not in acute distress.    Appearance: She is well-developed.  Cardiovascular:     Rate and Rhythm: Normal rate and regular rhythm.  Pulmonary:     Effort: Pulmonary effort is normal.     Breath sounds: Normal breath sounds.  Neurological:     Mental Status: She is alert and oriented to person, place, and time.       Imaging: CT SOFT TISSUE NECK WO CONTRAST  Result Date: 04/12/2020 CLINICAL  DATA:  COVID-19. EXAM: CT NECK WITHOUT CONTRAST TECHNIQUE: Multidetector CT imaging of the neck was performed following the standard protocol without intravenous contrast. COMPARISON:  None. FINDINGS: PHARYNX AND LARYNX: The nasopharynx, oropharynx and larynx are normal. Visible portions of the oral cavity, tongue base and floor of mouth are normal. Normal epiglottis, vallecula and pyriform sinuses. The larynx is normal. No retropharyngeal abscess, effusion or lymphadenopathy. SALIVARY GLANDS: Normal parotid, submandibular and sublingual glands. THYROID: Normal. LYMPH NODES: No enlarged or abnormal density lymph nodes. VASCULAR: Major cervical vessels are patent. LIMITED INTRACRANIAL: Normal. VISUALIZED ORBITS: Normal. MASTOIDS AND VISUALIZED PARANASAL SINUSES: No fluid levels or advanced mucosal thickening. No mastoid effusion. SKELETON: No bony spinal canal stenosis. No lytic or blastic lesions. UPPER CHEST: Bilateral peripheral predominant ground-glass airspace opacities. OTHER: None. IMPRESSION: 1. Normal epiglottis.  No acute abnormality of the neck. 2. Bilateral peripheral predominant ground-glass airspace opacities, consistent with viral pneumonia. Electronically Signed   By: Deatra Robinson M.D.   On: 04/12/2020 21:49   CT CHEST WO CONTRAST  Result Date: 04/12/2020 CLINICAL DATA:  Pneumonia, effusion or abscess suspected, COVID-19 positive EXAM: CT CHEST WITHOUT CONTRAST TECHNIQUE: Multidetector CT imaging of the chest was performed following the standard protocol without IV contrast. COMPARISON:  Radiograph 04/12/2020 FINDINGS: Motion degraded imaging. Cardiovascular: Limited evaluation the absence of contrast media. Normal heart size. No pericardial effusion. No clear pericardial gas is seen to suggest pneumopericardium. The aorta is normal caliber. Central pulmonary arteries are normal caliber. Luminal evaluation of the vasculature precluded in the absence of contrast media. Mediastinum/Nodes:  Pneumoperitoneum predominantly within the anterior mediastinum and tracking along the proximal great vessels into the thoracic inlet. Findings in the neck are better detailed on dedicated CT performed independently. No mediastinal fluid is seen. No discernible acute abnormality or abnormal thickening of the esophagus. Trachea is unremarkable. Thyroid gland is normal. Lungs/Pleura: Extensive respiratory motion artifact. There heterogeneous multifocal areas of mixed consolidative and ground-glass opacity including multiple areas of more nodular consolidation in the bilateral lungs. Chronic elevation of the right hemidiaphragm is somewhat more pronounced than on comparison though this may be related to atelectatic changes which are present as well. Diffuse airways thickening is suspected. No visible pneumothorax. Upper Abdomen: No acute abnormalities present in the visualized portions of the upper abdomen. Musculoskeletal: Mild degenerative changes in the spine. No acute or worrisome chest wall lesions. IMPRESSION: 1. Severely motion degraded imaging. 2. Multifocal areas of mixed consolidative and ground-glass opacity including multiple areas of more nodular consolidation in the bilateral lungs, concerning for multifocal pneumonia in the setting of COVID-19 positivity. 3. Pneumoperitoneum predominantly within the anterior mediastinum and tracking along the proximal great vessels into the thoracic inlet. No gross abnormality of the trachea or esophagus is seen within the limitations of this unenhanced, motion degraded exam. This may be related to  barotrauma underlying airspace disease particularly as the incidence of spontaneous pneumomediastinum has been shown to have up to a 7 fold increase in patients diagnosed with COVID 19. 4. Chronic elevation of the right hemidiaphragm is somewhat more pronounced than on comparison though this may be related to atelectatic changes which are present as well. Electronically Signed    By: Kreg Shropshire M.D.   On: 04/12/2020 21:53   CT ANGIO CHEST PE W OR WO CONTRAST  Result Date: 04/13/2020 CLINICAL DATA:  Abdominal pain, fever, PE suspected, positive D-dimer EXAM: CT ANGIOGRAPHY CHEST WITH CONTRAST TECHNIQUE: Multidetector CT imaging of the chest was performed using the standard protocol during bolus administration of intravenous contrast. Multiplanar CT image reconstructions and MIPs were obtained to evaluate the vascular anatomy. CONTRAST:  OMNIPAQUE IOHEXOL 350 MG/ML SOLN COMPARISON:  Contemporary CT of the abdomen and pelvis, CT chest 04/12/2020 FINDINGS: Cardiovascular: Satisfactory opacification the pulmonary arteries to the segmental level. No pulmonary artery filling defects are identified. Central pulmonary arteries are top normal caliber. Normal heart size. No pericardial effusion. No convincing evidence of pneumopericardium. The aortic root is suboptimally assessed given cardiac pulsation artifact. The aorta is normal caliber. No acute luminal abnormality of the imaged aorta. No periaortic stranding or hemorrhage. Shared origin of the brachiocephalic and left common carotid arteries. Proximal great vessels are otherwise unremarkable. Mediastinum/Nodes: Small foci of pneumomediastinum predominantly along the anterior and left middle mediastinum. No mediastinal fluid. No acute abnormality of the trachea or esophagus. Thyroid gland and thoracic inlet is unremarkable. No worrisome mediastinal, hilar or axillary adenopathy. Few scattered subcentimeter low-attenuation mediastinal and hilar nodes are likely reactive. Lungs/Pleura: Heterogeneous areas of mixed consolidative and ground-glass opacity are present again throughout both lungs with minimal interval change from the comparison 1 day prior. Redemonstrated elevation of the right hemidiaphragm. No pneumothorax or effusion. Upper Abdomen: Dedicated CT of the abdomen and pelvis was performed and dictated separately, please see  report for further details. Appearance on these chest images suggests the presence of hepatic steatosis and layering biliary sludge and or gallstones within the gallbladder. Additional asymmetric right perinephric stranding and pelvic fullness, could be seen with ascending tract infection or passage of a calculus. Musculoskeletal: Multilevel degenerative changes in the spine. No acute or worrisome osseous lesions. Review of the MIP images confirms the above findings. IMPRESSION: 1. No evidence of acute pulmonary artery embolism. 2. Heterogeneous areas of mixed consolidative and ground-glass opacity throughout both lungs with minimal interval change from the comparison 1 day prior, concerning for a multifocal pneumonia including potential atypical viral etiologies such as COVID-19. 3. Few persistent small foci of pneumomediastinum similar to slightly decreased from comparison, of uncertain etiology but possibly reflecting a spontaneous pneumothorax in the setting of COVID-19 with detailed discussion on comparison CT. 4. Dedicated CT of the abdomen and pelvis was performed, please see report for further details. Electronically Signed   By: Kreg Shropshire M.D.   On: 04/13/2020 21:47   CT ABDOMEN PELVIS W CONTRAST  Result Date: 04/13/2020 CLINICAL DATA:  Abdominal pain and fever. EXAM: CT ABDOMEN AND PELVIS WITH CONTRAST TECHNIQUE: Multidetector CT imaging of the abdomen and pelvis was performed using the standard protocol following bolus administration of intravenous contrast. CONTRAST:  OMNIPAQUE IOHEXOL 350 MG/ML SOLN COMPARISON:  None. FINDINGS: Lower chest: Patchy bilateral airspace opacities at the lung bases. Upper normal heart size. Hepatobiliary: Diffusely decreased hepatic density consistent with steatosis. No evidence of focal hepatic lesion. Heterogeneous density in the gallbladder which may  represent sludge or noncalcified stones. There is no biliary dilatation. Pancreas: Mild motion artifact  through the distal pancreas. No ductal dilatation or inflammation. Spleen: Normal in size without focal abnormality. Adrenals/Urinary Tract: No adrenal nodule. There is heterogeneous enhancement of the right kidney with mild perinephric edema. There is mild proximal periureteric stranding. No evidence of renal fluid collection. Small cyst in the mid upper left kidney. No hydronephrosis. Partially distended urinary bladder with mild wall thickening about the dome. Stomach/Bowel: Ingested material within the stomach. There is no gastric wall thickening. No evidence of small bowel obstruction or inflammatory change. Normal appendix. Minimal liquid stool in the proximal colon. There is formed stool in the more distal colon. No colonic wall thickening or pericolonic edema. There is stool in the rectum. Vascular/Lymphatic: Normal caliber abdominal aorta. Intact IVC. No bulky abdominopelvic adenopathy. Reproductive: Uterus and bilateral adnexa are unremarkable. Other: Small fat containing umbilical hernia. No ascites. No free air. No evidence of intra-abdominal abscess. Musculoskeletal: There are no acute or suspicious osseous abnormalities. Transitional lumbosacral anatomy. Sclerotic density in the right proximal femur is typical of bone island. IMPRESSION: 1. Heterogeneous enhancement of the right kidney with mild perinephric edema and proximal periureteric stranding, suspicious for pyelonephritis. No renal fluid collection. 2. Mild wall thickening about the dome of the urinary bladder, can be seen with cystitis. 3. Hepatic steatosis. 4. Heterogeneous density in the gallbladder may represent sludge or noncalcified stones. Electronically Signed   By: Narda Rutherford M.D.   On: 04/13/2020 21:43   DG Chest Port 1 View  Result Date: 04/12/2020 CLINICAL DATA:  Shortness of breath. EXAM: PORTABLE CHEST 1 VIEW COMPARISON:  September 15, 2018. FINDINGS: The heart size and mediastinal contours are within normal limits. No  pneumothorax or effusion is noted. Patchy airspace opacities are noted in both lungs, left greater than right, consistent multifocal pneumonia. The visualized skeletal structures are unremarkable. IMPRESSION: Bilateral multifocal pneumonia, left greater than right. Electronically Signed   By: Lupita Raider M.D.   On: 04/12/2020 14:41   VAS [place] LOWER EXTREMITY VENOUS (DVT)  Result Date: 04/15/2020  Lower Venous DVTStudy Indications: COVID positive, elevated d-dimer >20.  Comparison Study: No prior study Performing Technologist: Gertie Fey MHA, RDMS, RVT, RDCS  Examination Guidelines: A complete evaluation includes B-mode imaging, spectral Doppler, color Doppler, and power Doppler as needed of all accessible portions of each vessel. Bilateral testing is considered an integral part of a complete examination. Limited examinations for reoccurring indications may be performed as noted. The reflux portion of the exam is performed with the patient in reverse Trendelenburg.  +---------+---------------+---------+-----------+----------+--------------+ RIGHT    CompressibilityPhasicitySpontaneityPropertiesThrombus Aging +---------+---------------+---------+-----------+----------+--------------+ CFV      Full           Yes      Yes                                 +---------+---------------+---------+-----------+----------+--------------+ SFJ      Full                                                        +---------+---------------+---------+-----------+----------+--------------+ FV Prox  Full                                                        +---------+---------------+---------+-----------+----------+--------------+  FV Mid   Full                                                        +---------+---------------+---------+-----------+----------+--------------+ FV DistalFull                                                         +---------+---------------+---------+-----------+----------+--------------+ PFV      Full                                                        +---------+---------------+---------+-----------+----------+--------------+ POP      Full           Yes      Yes                                 +---------+---------------+---------+-----------+----------+--------------+ PTV      None                    No                   Acute          +---------+---------------+---------+-----------+----------+--------------+ PERO     Full                                                        +---------+---------------+---------+-----------+----------+--------------+   +---------+---------------+---------+-----------+----------+--------------+ LEFT     CompressibilityPhasicitySpontaneityPropertiesThrombus Aging +---------+---------------+---------+-----------+----------+--------------+ CFV      Full           Yes      Yes                                 +---------+---------------+---------+-----------+----------+--------------+ SFJ      Full                                                        +---------+---------------+---------+-----------+----------+--------------+ FV Prox  Full                                                        +---------+---------------+---------+-----------+----------+--------------+ FV Mid   Full                                                        +---------+---------------+---------+-----------+----------+--------------+  FV DistalFull                                                        +---------+---------------+---------+-----------+----------+--------------+ PFV      Full                                                        +---------+---------------+---------+-----------+----------+--------------+ POP      Full           Yes      Yes                                  +---------+---------------+---------+-----------+----------+--------------+ PTV      Full                                                        +---------+---------------+---------+-----------+----------+--------------+ PERO     Full                                                        +---------+---------------+---------+-----------+----------+--------------+     Summary: RIGHT: - Findings consistent with acute deep vein thrombosis involving the right posterior tibial veins. - No cystic structure found in the popliteal fossa.  LEFT: - There is no evidence of deep vein thrombosis in the lower extremity.  - No cystic structure found in the popliteal fossa.  *See table(s) above for measurements and observations. Electronically signed by Coral ElseVance Brabham MD on 04/15/2020 at 1:07:00 PM.    Final      Assessment & Plan:   Pneumonia due to COVID-19 virus Stay well hydrated  Stay active  Deep breathing exercises  May take tylenol or fever or pain  May take mucinex DM twice daily    Follow up:  Follow up as needed      Ivonne Andrewonya S Aubrianne Molyneux, NP 04/27/2020

## 2020-04-26 NOTE — Patient Instructions (Addendum)
Covid 19:   Stay well hydrated  Stay active  Deep breathing exercises  May take tylenol or fever or pain  May take mucinex DM twice daily    Follow up:  Follow up as needed

## 2020-04-27 NOTE — Assessment & Plan Note (Signed)
Stay well hydrated  Stay active  Deep breathing exercises  May take tylenol or fever or pain  May take mucinex DM twice daily    Follow up:  Follow up as needed

## 2020-05-08 ENCOUNTER — Ambulatory Visit (INDEPENDENT_AMBULATORY_CARE_PROVIDER_SITE_OTHER): Payer: Self-pay | Admitting: Student

## 2020-05-08 VITALS — BP 167/119 | HR 101 | Temp 98.4°F | Ht 63.0 in | Wt 233.1 lb

## 2020-05-08 DIAGNOSIS — R7881 Bacteremia: Secondary | ICD-10-CM

## 2020-05-08 DIAGNOSIS — U071 COVID-19: Secondary | ICD-10-CM

## 2020-05-08 DIAGNOSIS — Z8639 Personal history of other endocrine, nutritional and metabolic disease: Secondary | ICD-10-CM

## 2020-05-08 DIAGNOSIS — E1365 Other specified diabetes mellitus with hyperglycemia: Secondary | ICD-10-CM

## 2020-05-08 DIAGNOSIS — J1282 Pneumonia due to coronavirus disease 2019: Secondary | ICD-10-CM

## 2020-05-08 DIAGNOSIS — Z23 Encounter for immunization: Secondary | ICD-10-CM

## 2020-05-08 DIAGNOSIS — B9620 Unspecified Escherichia coli [E. coli] as the cause of diseases classified elsewhere: Secondary | ICD-10-CM

## 2020-05-08 DIAGNOSIS — I82461 Acute embolism and thrombosis of right calf muscular vein: Secondary | ICD-10-CM

## 2020-05-08 DIAGNOSIS — E119 Type 2 diabetes mellitus without complications: Secondary | ICD-10-CM

## 2020-05-08 DIAGNOSIS — Z1159 Encounter for screening for other viral diseases: Secondary | ICD-10-CM

## 2020-05-08 DIAGNOSIS — B370 Candidal stomatitis: Secondary | ICD-10-CM

## 2020-05-08 DIAGNOSIS — I159 Secondary hypertension, unspecified: Secondary | ICD-10-CM

## 2020-05-08 DIAGNOSIS — E039 Hypothyroidism, unspecified: Secondary | ICD-10-CM

## 2020-05-08 MED ORDER — AMLODIPINE BESYLATE 5 MG PO TABS: 5.0000 mg | ORAL_TABLET | Freq: Every day | ORAL | 1 refills | Status: DC

## 2020-05-08 MED ORDER — NOVOLIN 70/30 (70-30) 100 UNIT/ML ~~LOC~~ SUSP: SUBCUTANEOUS | 3 refills | Status: DC

## 2020-05-08 NOTE — Patient Instructions (Signed)
Tonya Simmons,  Me alegra mucho ver que te sientes mejor!  Hoy, discutimos que sus azcares estn bajo un mejor control, aunque todava estn ligeramente elevados. Aumente su Novolin a 22 unidades por la maana y 22 unidades por la noche. Contine controlando su nivel de azcar en la sangre a primera hora de la maana antes de la insulina / desayuno, y nuevamente 2 horas despus de cada comida.  Su presin arterial tambin estaba alta hoy, as que Audiological scientist amlodipino a su farmacia. Tome una pastilla por la [employer]. Tambin sera beneficioso si pudiera comprar un monitor de presin arterial en casa para registrar su presin arterial al menos algunas veces a la semana.  Tambin verificaremos sus recuentos sanguneos, colesterol, funcin renal, protena en orina, funcin tiroidea y deteccin de hepatitis C.  Recibi su vacuna contra la gripe hoy.  Llame para programar una cita en 2 semanas al [phone]. Mientras tanto, llame si tiene preguntas o inquietudes.  Gracias y tenga cuidado,  Dr. Laddie Aquas  ---------------------------------------------  Tonya Simmons,  I am so glad to see that you are feeling better!   Today, we discussed that your sugars are getting under much better control, although they are still slightly elevated.  Please increase your Novolin to 22 units in the morning and 22 units in the evening. Please continue checking your blood sugar first thing in the morning before insulin/breakfast, and they again 2 hours after every meal.   Your blood pressure was also high today, so I will prescribe amlodipine to your pharmacy. Please take one pill in the morning each day. It would also be beneficial if you could purchase a blood pressure monitor for home to record your blood pressures at least a few times each week.  We will also check your blood counts, cholesterol, kidney function, urine protein, thyroid function, and screening for  hepatitis C.  You received your flu shot today.  Please call to schedule an appointment in 2 weeks at [phone]. Please call with any questions or concerns in the meantime.   Thank you and take care,  Dr. Laddie Aquas   Diabetes mellitus y nutricin, en adultos Diabetes Mellitus and Nutrition, Adult Si sufre de diabetes (diabetes mellitus), es muy importante tener hbitos alimenticios saludables debido a que sus niveles de Psychologist, counselling sangre (glucosa) se ven afectados en gran medida por lo que come y bebe. [place] alimentos saludables en las cantidades [place], aproximadamente a la [employer], [place] ayudar a:  Scientist, physiological glucemia.  Disminuir el riesgo de sufrir una enfermedad cardaca.  Mejorar la presin arterial.  Barista o mantener un peso saludable. Todas las personas que sufren de diabetes son diferentes y cada una tiene necesidades diferentes en cuanto a un plan de alimentacin. El mdico puede recomendarle que trabaje con un especialista en dietas y nutricin (nutricionista) para Tax adviser plan para usted. Su plan de alimentacin puede variar segn factores como:  Las caloras que necesita.  Los medicamentos que toma.  Su peso.  Sus niveles de glucemia, presin arterial y colesterol.  Su nivel de [place].  Otras afecciones que tenga, como enfermedades cardacas o renales. Cmo me afectan los carbohidratos? Los carbohidratos, o hidratos de carbono, afectan su nivel de glucemia ms que cualquier otro tipo de alimento. La ingesta de carbohidratos naturalmente aumenta la cantidad de [employer]. El recuento de carbohidratos es un mtodo destinado a Midwife un registro de la cantidad de carbohidratos que se consumen.  El recuento de carbohidratos es importante para Pharmacologist la glucemia a un nivel saludable, especialmente si utiliza insulina o toma determinados medicamentos por va oral para la diabetes. Es importante conocer la cantidad de  carbohidratos que se pueden ingerir en cada comida sin correr Surveyor, minerals. Esto es Government social research officer. Su nutricionista puede ayudarlo a calcular la cantidad de carbohidratos que debe ingerir en cada comida y en cada refrigerio. Entre los alimentos que contienen carbohidratos, se incluyen:  Pan, cereal, arroz, pastas y galletas.  Papas y maz.  Guisantes, frijoles y lentejas.  Leche y Dentist.  Nils Pyle y [place].  Postres, como pasteles, galletas, helado y caramelos. Cmo me afecta el alcohol? El alcohol puede provocar disminuciones sbitas de la glucemia (hipoglucemia), especialmente si utiliza insulina o toma determinados medicamentos por va oral para la diabetes. La hipoglucemia es una afeccin potencialmente mortal. Los sntomas de la hipoglucemia (somnolencia, mareos y confusin) son similares a los sntomas de haber consumido demasiado alcohol. Si el mdico afirma que el alcohol es seguro para usted, [place] estas pautas:  Limite el consumo de alcohol a no ms de por da si es mujer y no est [place], y a si es hombre. Una medida equivale a 12oz ( ) de cerveza, 5oz ( ) de vino o 1oz (65ml) de bebidas alcohlicas de alta graduacin.  No beba con el estmago vaco.  Mantngase hidratado bebiendo agua, refrescos dietticos o t helado sin azcar.  Tenga en cuenta que los refrescos comunes, los jugos y otras bebida para Engineer, manufacturing pueden contener mucha azcar y se deben contar como carbohidratos. Cules son algunos consejos para seguir este plan?  Leer las etiquetas de los alimentos  Comience por leer el tamao de la porcin en la "Informacin nutricional" en las etiquetas de los alimentos envasados y las bebidas. La cantidad de caloras, carbohidratos, grasas y otros nutrientes mencionados en la etiqueta se basan en una porcin del alimento. Muchos alimentos contienen ms de una porcin por envase.  Verifique la cantidad total de gramos (g) de  carbohidratos totales en una porcin. Puede calcular la cantidad de porciones de carbohidratos al dividir el total de carbohidratos por 15. Por ejemplo, si un alimento tiene un total de 30g de carbohidratos, equivale a 2 porciones de carbohidratos.  Verifique la cantidad de gramos (g) de grasas saturadas y grasas trans en una porcin. Escoja alimentos que no contengan grasa o que tengan un bajo contenido.  Verifique la cantidad de miligramos (mg) de sal (sodio) en una porcin. La mayora de las personas deben limitar la ingesta de sodio total a menos de 2300mg  por .  Siempre consulte la informacin nutricional de los alimentos etiquetados como "con bajo contenido de grasa" o "sin grasa". Estos alimentos pueden tener un mayor contenido de Futures trader agregada o carbohidratos refinados, y deben evitarse.  Hable con su nutricionista para identificar sus objetivos diarios en cuanto a los nutrientes mencionados en la etiqueta. Al ir de compras  Evite comprar alimentos procesados, enlatados o precocinados. Estos alimentos tienden a International aid/development worker mayor cantidad de [place], sodio y azcar agregada.  Compre en la zona exterior de la tienda de comestibles. Esta zona incluye frutas y verduras frescas, granos a granel, carnes frescas y productos lcteos frescos. Al cocinar  Utilice mtodos de coccin a baja temperatura, como hornear, en lugar de mtodos de coccin a alta temperatura, como frer en abundante aceite.  Cocine con aceites saludables, como el aceite de [place], canola o [place].  Evite cocinar con manteca, crema  o carnes con alto contenido de [place] Planificacin de las comidas  Coma las comidas y los refrigerios regularmente, preferentemente a la misma hora todos [place]. Evite pasar largos perodos de tiempo sin comer.  Consuma alimentos ricos en fibra, como frutas frescas, verduras, frijoles y cereales integrales. Consulte a su nutricionista sobre cuntas porciones de carbohidratos puede  consumir en cada comida.  Consuma entre 4 y 6 onzas (oz) de protenas magras por da, como carnes [place], pollo, pescado, huevos o tofu. Una onza de protena magra equivale a: ? 1 onza de carne, pollo o pescado. ? 1huevo. ?  taza de tofu.  Coma algunos alimentos por da que contengan grasas saludables, como aguacates, frutos secos, semillas y pescado. Estilo de vida  Controle su nivel de glucemia con regularidad.  Haga actividad fsica habitualmente como se lo haya indicado el mdico. Esto puede incluir lo siguiente: ? semanales de ejercicio de intensidad moderada o alta. Esto podra incluir caminatas dinmicas, ciclismo o gimnasia acutica. ? Realizar ejercicios de elongacin y de fortalecimiento, como yoga o levantamiento de pesas, por lo menos 2veces por semana.  Tome los [employer] se lo haya indicado el mdico.  No consuma ningn producto que contenga nicotina o tabaco, como cigarrillos y Administrator, Civil Service. Si necesita ayuda para dejar de fumar, consulte al [employer] con un asesor o instructor en diabetes para identificar estrategias para controlar el estrs y cualquier desafo emocional y social. Preguntas para hacerle al mdico  Es necesario que consulte a IT trainer en el cuidado de la diabetes?  Es necesario que me rena con un nutricionista?  A qu nmero puedo llamar si tengo preguntas?  Cules son los mejores momentos para controlar la glucemia? Dnde encontrar ms informacin:  Asociacin Estadounidense de la Diabetes (American Diabetes Association): diabetes.org  Academia de Nutricin y Pension scheme manager (Academy of Nutrition and Dietetics): www.eatright.org  [employer] de la Diabetes y las Enfermedades Digestivas y [facility] of Diabetes and Digestive and Kidney Diseases, NIH): CarFlippers.tn Resumen  Un plan de alimentacin saludable lo ayudar a Scientist, physiological glucemia y Pharmacologist un estilo de vida  saludable.  Trabajar con un especialista en dietas y nutricin (nutricionista) puede ayudarlo a Designer, television/film set de alimentacin para usted.  Tenga en cuenta que los carbohidratos (hidratos de carbono) y el alcohol tienen efectos inmediatos en sus niveles de glucemia. Es importante contar los carbohidratos que ingiere y consumir alcohol con prudencia. Esta informacin no tiene Theme park manager el consejo del mdico. Asegrese de hacerle al mdico cualquier pregunta que tenga. Document Revised: 03/11/2017 Document Reviewed: 10/21/2016 Elsevier Patient Education  2020 [employer].

## 2020-05-08 NOTE — Assessment & Plan Note (Signed)
Patient continues to experience mild dry cough but without shortness of breath or CP s/p recent COVID-19 infection last month. She is satting at 96% on room air and is slightly tachycardic although afebrile.  - Continue Tylenol or NSAIDs for pain PRN - Continue Robitussin or Mucinex DM for cough PRN

## 2020-05-08 NOTE — Assessment & Plan Note (Signed)
Patient had been taking of levothyroxine daily in the past; however, she has not had TSH testing in many years and is no longer on levothyroxine.  - Will check TSH

## 2020-05-08 NOTE — Assessment & Plan Note (Signed)
Patient was diagnosed with E. Coli bacteremia, thought to be due to pyelonephritis this past admission. She denies any recurrence of fevers or chills or any urinary symptoms.  - Will check CBC with differential

## 2020-05-08 NOTE — Assessment & Plan Note (Signed)
Patient states oral thrush has resolved s/p nystatin swishes. She endorses throat hoarseness but denies throat pain. Likely in the setting of high blood sugars.  - Continue to monitor - Discontinue nystatin swishes for now.

## 2020-05-08 NOTE — Progress Notes (Signed)
   CC: Hyperglycemia  HPI:  Tonya Simmons is a 39 y.o. morbidly obese, Spanish-speaking lady, presenting for follow-up of uncontrolled DM after complicated hospital stay 9/29-10/5/21 (see problem list for all hospital diagnoses) due to COVID-19 pneumonia. She states that she has been taking her increased dose of Novolin 20 units twice daily as prescribed and has been checking her sugars every morning (fasting) and after taking her insulin. She states her sugars first thing in the morning have been about 130-150 and go as high as 170 after eating. She denies any polydipsia, polyuria, headaches, nausea or vomiting, and denies any hypoglycemic episodes. She states that she is also taking Apixaban 5mg  twice daily without missing doses and denies any bleeding, RLE pain, SOB or CP. She continues to endorse mild dry cough, although attributes this to discomfort from voice hoarseness. She states that her throat pain has resolved and she stopped taking oral Nystatin swishes after her thrush went away. She denies any return of fevers, chills, hematuria or dysuria.   Past Medical History:  Diagnosis Date  . Acute deep vein thrombosis (DVT) of calf muscle vein of right lower extremity (HCC)   . Diabetes mellitus without complication (HCC)   . Diabetic ketoacidosis (HCC)   . E coli bacteremia   . Hepatic steatosis   . Hypertension   . Hypertension in pregnancy, antepartum 12/11/2011   Borderline dx: no antenatal testing at this time (01/23/12)   . Oral candidiasis   . Pneumonia due to COVID-19 virus   . Pyelonephritis    Review of Systems:  All others negative except as noted above in HPI.   Physical Exam:  Vitals:   05/08/20 1355 05/08/20 1506  BP: (!) 157/114 (!) 167/119  Pulse: (!) 109 (!) 101  Temp: 98.4 F (36.9 C)   TempSrc: Oral   SpO2: 96%   Weight: 233 lb 1.6 oz (105.7 kg)   Height: 5\' 3"  (1.6 m)    General: Patient is morbidly obese. She appears well. No acute  distress. Eyes: Sclera non-icteric. No conjunctival injection.  Respiratory: Lungs are CTA, bilaterally. No wheezes, rales, or rhonchi.  Cardiovascular: Rate is borderline tachycardic. Rhythm is regular. No murmurs, rubs, or gallops. Trace lower extremity pitting edema. Distal pulses are 2+ in all four extremities. Musculoskeletal: No lower extremity tenderness to palpation. Strength is 5/5 throughout. Neurological: Sensation is intact in bilateral feet to light touch. Alert and oriented. Abdominal: Soft and non-tender to palpation. Bowel sounds intact. No rebound or guarding. Skin: No lesions. No rashes.  Psych: Normal affect. Normal tone of voice.   Assessment & Plan:   See Encounters Tab for problem based charting.  Patient received her flu vaccination today and hepatitis C antibodies to screen for HCV infection. Patient wished to defer pap smear testing until future visit.  Patient seen with Dr. 05/10/20.  , MD 05/08/2020, 7:55 PM Pager: [phone]

## 2020-05-08 NOTE — Assessment & Plan Note (Signed)
Patient was found to have RLE DVT during recent admission earlier this month. She continues to take Eliquis BID without missing any doses. She denies any leg pain, swelling, discoloration, CP or SOB. CTA on admission negative for PE. She denies any history of blood clots and it is believed her PE was likely provoked due to concurrent COVID-19 infection.  - Continue Eliquis 5mg  twice daily until 07/12/20

## 2020-05-08 NOTE — Assessment & Plan Note (Addendum)
Patient was found to have uncontrolled DM with Hgb A1c of 12% on 04/12/20, when she was found to be in DKA. Her Novolin 15 units twice daily was increased to 20 units twice daily. Patient denies missing any doses, although notes that her sugars continue to remain >130 consistently throughout the day without episodes of hypoglycemia.  - Will increase Novolin to 22 units twice daily - Encouraged patient to check sugars fasting before breakfast/insulin dosing in the morning, and again 2 hours after every meal - Will check urine microalbumin:creatinine  - Repeat BMP  - Will check lipid panel  - Patient would likely benefit from addition of Metformin once symptoms stabilize

## 2020-05-08 NOTE — Assessment & Plan Note (Signed)
BP Readings from Last 3 Encounters:  05/08/20 (!) 167/119  04/26/20 138/82  04/18/20 (!) 141/110   Patient has a history of HTN and has not been taking amlodipine 5mg  that she had been prescribed in the past. Initial blood pressure of 157/114 increased to 167/119 on repeat check.  - Restarted amlodipine 5mg   - Given DM history, patient would likely benefit from addition of ACEi/ARB - Will check for proteinuria - Encouraged patient to get BP cuff for home monitoring at least a few times weekly.

## 2020-05-09 LAB — CBC WITH DIFFERENTIAL/PLATELET
Basophils Absolute: 0 10*3/uL (ref 0.0–0.2)
Basos: 1 %
EOS (ABSOLUTE): 0.2 10*3/uL (ref 0.0–0.4)
Eos: 3 %
Hematocrit: 39.1 % (ref 34.0–46.6)
Hemoglobin: 13.3 g/dL (ref 11.1–15.9)
Immature Grans (Abs): 0 10*3/uL (ref 0.0–0.1)
Immature Granulocytes: 1 %
Lymphocytes Absolute: 1.6 10*3/uL (ref 0.7–3.1)
Lymphs: 30 %
MCH: 29.8 pg (ref 26.6–33.0)
MCHC: 34 g/dL (ref 31.5–35.7)
MCV: 88 fL (ref 79–97)
Monocytes Absolute: 0.4 10*3/uL (ref 0.1–0.9)
Monocytes: 8 %
Neutrophils Absolute: 3 10*3/uL (ref 1.4–7.0)
Neutrophils: 57 %
Platelets: 229 10*3/uL (ref 150–450)
RBC: 4.47 x10E6/uL (ref 3.77–5.28)
RDW: 14.9 % (ref 11.7–15.4)
WBC: 5.3 10*3/uL (ref 3.4–10.8)

## 2020-05-09 LAB — BMP8+ANION GAP
Anion Gap: 14 mmol/L (ref 10.0–18.0)
BUN/Creatinine Ratio: 18 (ref 9–23)
BUN: 13 mg/dL (ref 6–20)
CO2: 21 mmol/L (ref 20–29)
Calcium: 9.6 mg/dL (ref 8.7–10.2)
Chloride: 104 mmol/L (ref 96–106)
Creatinine, Ser: 0.71 mg/dL (ref 0.57–1.00)
GFR calc Af Amer: 125 mL/min/{1.73_m2} (ref >59)
GFR calc non Af Amer: 108 mL/min/{1.73_m2} (ref >59)
Glucose: 126 mg/dL — ABNORMAL HIGH (ref 65–99)
Potassium: 4.4 mmol/L (ref 3.5–5.2)
Sodium: 139 mmol/L (ref 134–144)

## 2020-05-09 LAB — MICROALBUMIN / CREATININE URINE RATIO
Creatinine, Urine: 32.5 mg/dL
Microalb/Creat Ratio: 97 mg/g creat — ABNORMAL HIGH (ref 0–29)
Microalbumin, Urine: 31.6 ug/mL

## 2020-05-09 LAB — HEPATITIS C ANTIBODY: Hep C Virus Ab: 0.1 s/co ratio (ref 0.0–0.9)

## 2020-05-09 LAB — LIPID PANEL
Chol/HDL Ratio: 6 ratio — ABNORMAL HIGH (ref 0.0–4.4)
Cholesterol, Total: 268 mg/dL — ABNORMAL HIGH (ref 100–199)
HDL: 45 mg/dL (ref >39)
LDL Chol Calc (NIH): 156 mg/dL — ABNORMAL HIGH (ref 0–99)
Triglycerides: 359 mg/dL — ABNORMAL HIGH (ref 0–149)
VLDL Cholesterol Cal: 67 mg/dL — ABNORMAL HIGH (ref 5–40)

## 2020-05-09 LAB — TSH: TSH: 2.98 u[IU]/mL (ref 0.450–4.500)

## 2020-05-12 ENCOUNTER — Telehealth: Payer: Self-pay | Admitting: Student

## 2020-05-12 DIAGNOSIS — E782 Mixed hyperlipidemia: Secondary | ICD-10-CM

## 2020-05-12 DIAGNOSIS — I10 Essential (primary) hypertension: Secondary | ICD-10-CM

## 2020-05-12 MED ORDER — LISINOPRIL 5 MG PO TABS: 5.0000 mg | ORAL_TABLET | Freq: Every day | ORAL | 11 refills | Status: DC

## 2020-05-12 MED ORDER — ROSUVASTATIN CALCIUM 20 MG PO TABS: 20.0000 mg | ORAL_TABLET | Freq: Every day | ORAL | 11 refills | Status: DC

## 2020-05-12 NOTE — Telephone Encounter (Signed)
Spanish interpreter services were used to leave a voicemail on patient's mobile device, informing her of her lab results. I explained that her cholesterol was high and her urine contained protein, concerning for kidney injury due to uncontrolled diabetes. I explained I am sending rosuvastatin 20mg  nightly and lisinopril 5mg  daily to her pharmacy and told her to anticipate a call from [facility] front desk to schedule a follow up appointment for HTN and DM in 2 weeks.   , PGY1 Internal Medicine [phone]

## 2020-05-15 ENCOUNTER — Telehealth: Payer: Self-pay | Admitting: General Practice

## 2020-05-15 NOTE — Telephone Encounter (Signed)
Please refer to message below.  When patient was here on 05/08/2020 stated she did not want to establish care at our practice.  Had in-house spanish interpreter, Ashby Dawes, call patient to verify.  Patient informed the interpreter again that she would go to another practice for her medical care.  Forwarding back to [place] team on behalf of Dr. Laddie Aquas.

## 2020-05-15 NOTE — Progress Notes (Signed)
Internal Medicine Clinic Attending  I saw and evaluated the patient.  I personally confirmed the key portions of the history and exam documented by Dr. Speakman and I reviewed pertinent patient test results.  The assessment, diagnosis, and plan were formulated together and I agree with the documentation in the resident's note.  

## 2020-05-15 NOTE — Telephone Encounter (Signed)
-----   Message from Glenford Bayley, MD sent at 05/12/2020  5:25 PM EDT ----- Regarding: Appointment Needed Hello,   Could we please call patient to schedule a follow-up clinic appointment in about 2 weeks for HTN and DM follow up?  Thank you,  Glenford Bayley, PGY1

## 2020-05-15 NOTE — Telephone Encounter (Signed)
Thank for you letting us know!

## 2020-10-10 ENCOUNTER — Ambulatory Visit: Payer: Self-pay | Admitting: Student

## 2020-10-10 ENCOUNTER — Other Ambulatory Visit: Payer: Self-pay

## 2020-10-10 ENCOUNTER — Encounter: Payer: Self-pay | Admitting: Student

## 2020-10-10 VITALS — BP 144/105 | HR 113 | Ht 63.0 in | Wt 251.3 lb

## 2020-10-10 DIAGNOSIS — I10 Essential (primary) hypertension: Secondary | ICD-10-CM

## 2020-10-10 DIAGNOSIS — E119 Type 2 diabetes mellitus without complications: Secondary | ICD-10-CM

## 2020-10-10 DIAGNOSIS — Z0000 Encounter for general adult medical examination without abnormal findings: Secondary | ICD-10-CM

## 2020-10-10 DIAGNOSIS — Z7984 Long term (current) use of oral hypoglycemic drugs: Secondary | ICD-10-CM

## 2020-10-10 DIAGNOSIS — Z794 Long term (current) use of insulin: Secondary | ICD-10-CM

## 2020-10-10 DIAGNOSIS — E1365 Other specified diabetes mellitus with hyperglycemia: Secondary | ICD-10-CM

## 2020-10-10 LAB — POCT GLYCOSYLATED HEMOGLOBIN (HGB A1C): Hemoglobin A1C: 7.6 % — AB (ref 4.0–5.6)

## 2020-10-10 LAB — GLUCOSE, CAPILLARY: Glucose-Capillary: 151 mg/dL — ABNORMAL HIGH (ref 70–99)

## 2020-10-10 MED ORDER — NOVOLIN 70/30 (70-30) 100 UNIT/ML ~~LOC~~ SUSP: SUBCUTANEOUS | 3 refills | Status: DC

## 2020-10-10 MED ORDER — METFORMIN HCL 500 MG PO TABS: 500.0000 mg | ORAL_TABLET | Freq: Every day | ORAL | 3 refills | Status: DC

## 2020-10-10 NOTE — Progress Notes (Signed)
   CC: diabetes, hypertension follow-up  HPI:  [name] is a 40 y.o. with medical history as listed below presenting to [facility] for diabetes and HTN follow-up.  Please see problem-based list for further details, assessments, and plans.  Past Medical History:  Diagnosis Date  . Acute deep vein thrombosis (DVT) of calf muscle vein of right lower extremity (HCC)   . Diabetes mellitus without complication (HCC)   . Diabetic ketoacidosis (HCC)   . E coli bacteremia   . Hepatic steatosis   . Hypertension   . Hypertension in pregnancy, antepartum 12/11/2011   Borderline dx: no antenatal testing at this time (01/23/12)   . Oral candidiasis   . Pneumonia due to COVID-19 virus   . Pyelonephritis    Review of Systems:  As per HPI  Physical Exam:  Vitals:   10/10/20 1031  BP: (!) 144/105  Pulse: (!) 113  SpO2: 98%  Weight: 251 lb 4.8 oz (114 kg)  Height: 5\' 3"  (1.6 m)   General: Pleasant, sitting comfortably in no acute distress CV: Tachycardic, regular rhythm. No m/r/g appreciated. Distal pulses 2+ bilaterally. Pulm: Normal work of breathing, clear to auscultation bilaterally. MSK: Normal bulk, tone. No pitting edema bilaterally.  Assessment & Plan:   See Encounters Tab for problem based charting.  Patient discussed with Dr. 

## 2020-10-10 NOTE — Patient Instructions (Addendum)
Tonya Simmons,  Me alegre de verle hoy!  Hoy hablamos sobre su diabetes y su presin arterial. Felicitaciones por su A1c! Recuerde que este es un promedio de sus azcares durante los ltimos tres meses. Eventualmente, me gustara poder detener la insulina y hacer la transicin a solo medicamentos orales. Nos volveremos a ver en dos semanas para su prueba de Papanicolaou y para volver a controlar la presin arterial.  Estamos deseando verle la proxima vez. Llama la clinica a [phone] si tiene preguntas o preocupaciones. El mejor tiempo para llamar es lunes a viernes, 9:00am - 4:00pm, pero hay alguien esta a su disposcion para lo que halga falta a cualquier hora. Si necesita recambio de los [employer], llama su farmacia una semana antes de fecha de finalizacion de la receta. La farmacia nos llamara para la solicitud.  Gracias que nos permite tomar parte en su asistencia medica. Deseamos lo mejor!  Karl Pock, Dr. Evlyn Kanner, MD

## 2020-10-11 DIAGNOSIS — Z0000 Encounter for general adult medical examination without abnormal findings: Secondary | ICD-10-CM | POA: Insufficient documentation

## 2020-10-11 NOTE — Assessment & Plan Note (Addendum)
Patient reports she has been doing well on her diabetes regimen. Notes that she takes her sugars daily, ranging from 130-170. She has not seen a reading below 100 or above 200. Denies malaise, nausea, vomiting, paresthesias, neuropathic pain.   A1c today much improved, down to 7.6% from 12% at last check. Ultimately hope to wean off of injectable and maintain on oral medications. Plan today to continue Novolin BID and start metformin 500mg  once daily. Of note, patient also reports she has not had a recent eye exam. Will place referral today as well. - Continue Novolin 22u BID - Start metformin 500mg  qd - Ophthalmology referral - Return to clinic in 3 months

## 2020-10-11 NOTE — Assessment & Plan Note (Signed)
Patient reports she has not had Pap smear in >5 years. Offered to perform today, but patient prefers female provider. Will have her return to clinic in two weeks for Pap smear.

## 2020-10-11 NOTE — Assessment & Plan Note (Addendum)
BP Readings from Last 3 Encounters:  10/10/20 (!) 144/105  05/08/20 (!) 167/119  04/26/20 138/82   Patient reports compliance with lisinopril. Previously had been prescribed amlodipine, but patient does not remember being prescribed this. She will sometimes take her blood pressure at home, reportedly SBP 120-130's. Denies chest pain, dyspnea, headache, blurry vision.   Encouraged patient to continue taking blood pressure at home and writing them down to return to clinic with. Will continue with lisinopril now, can consider amlodipine addition if elevated again when she returns in two weeks. Of note, patient is not currently on contraception at this time but reports she is not sexually active. Explained that certain medications, including lisinopril, could be harmful to a potential pregnancy. Encouraged her to discuss contraception options if becomes sexually active in the future. - Continue lisinopril 5mg  - BP readings at home - Can consider amlodipine if elevated in two weeks

## 2020-10-18 NOTE — Progress Notes (Signed)
Internal Medicine Clinic Attending ? ?Case discussed with Dr. Braswell  At the time of the visit.  We reviewed the resident?s history and exam and pertinent patient test results.  I agree with the assessment, diagnosis, and plan of care documented in the resident?s note.  ?

## 2020-10-24 ENCOUNTER — Encounter: Payer: Self-pay | Admitting: Student

## 2020-11-07 ENCOUNTER — Ambulatory Visit: Payer: Self-pay

## 2020-11-21 ENCOUNTER — Ambulatory Visit: Payer: Self-pay

## 2020-12-10 IMAGING — CT CT CHEST W/O CM
2 of 5 series · 14 of 36 positions shown, 17 images · non-contrast
Comparison: Radiograph 04/12/2020

CLINICAL DATA: Pneumonia, effusion or abscess suspected, AOOB7-KM
positive

EXAM:
CT CHEST WITHOUT CONTRAST
TECHNIQUE: Multidetector CT imaging of the chest was performed following the
standard protocol without IV contrast.

[Series 4: thorax 2.0 · axial · 0.73mm/px · z∈[-517,-293]mm · 11 of 136 slices shown, 14 images]
[im 12/136  mediastinal]
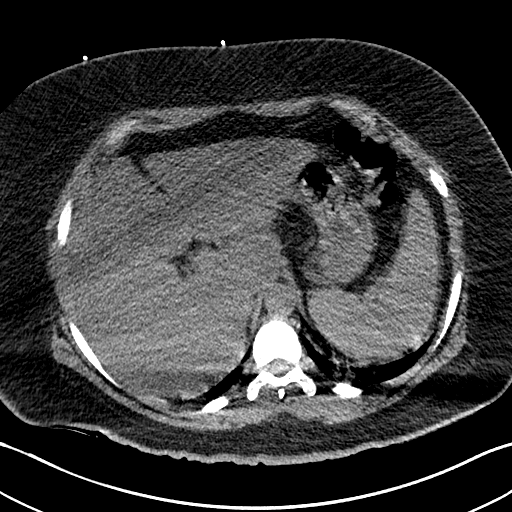
[im 12/136  lung]
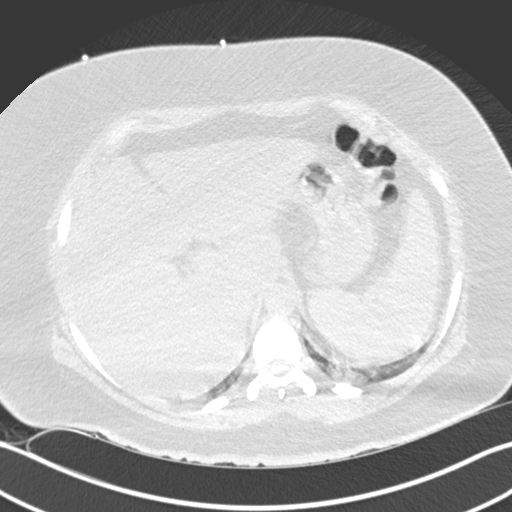
[im 23/136  lung]
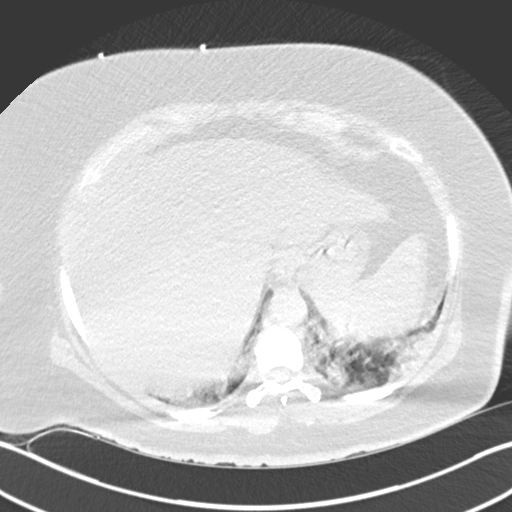
[im 34/136  lung]
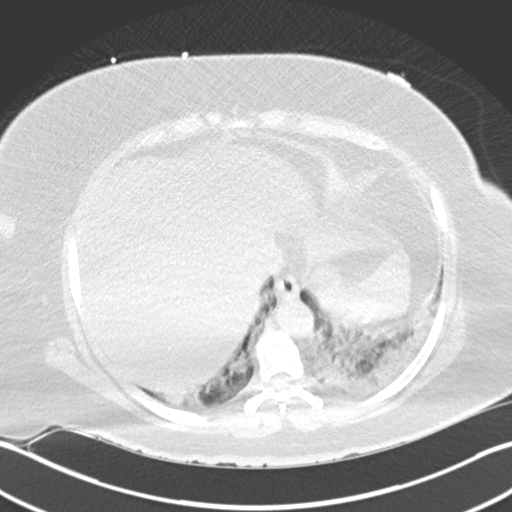
[im 46/136  lung]
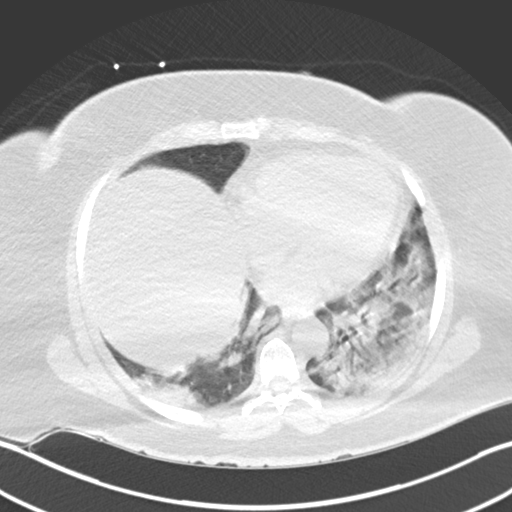
[im 57/136  mediastinal]
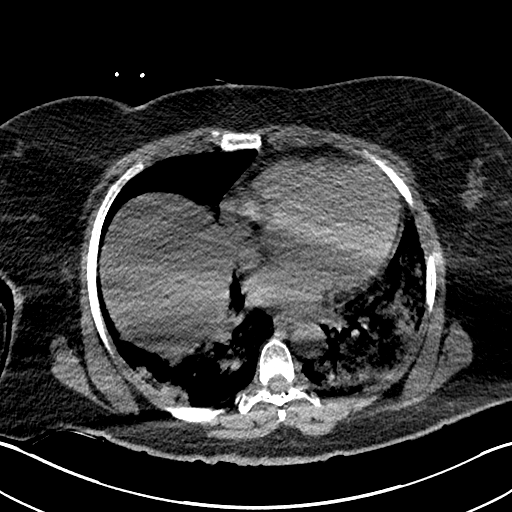
[im 57/136  lung]
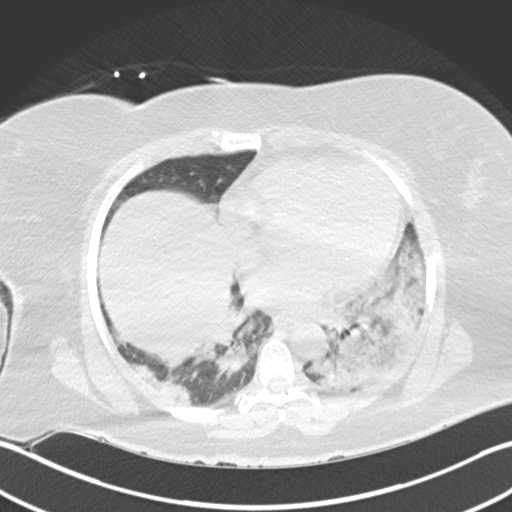
[im 68/136  lung]
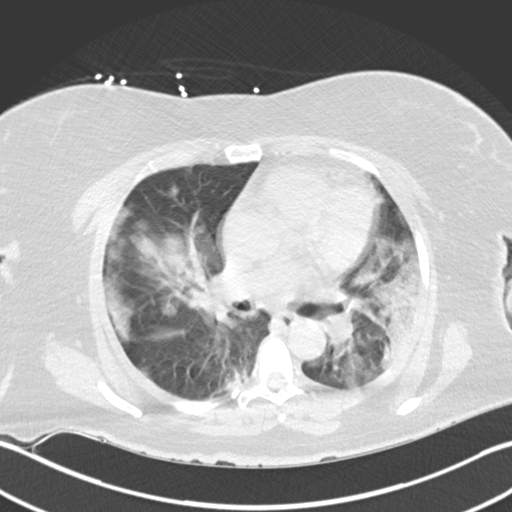
[im 79/136  lung]
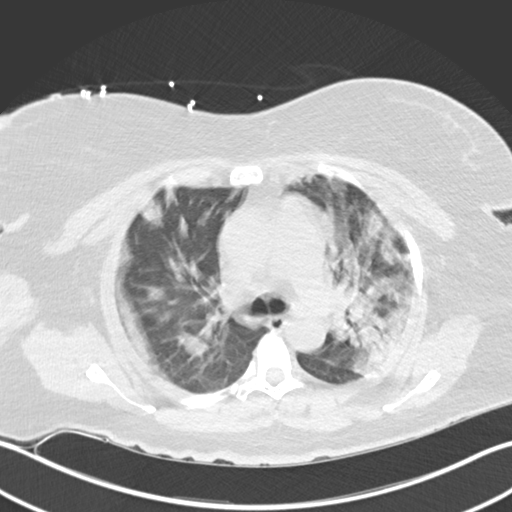
[im 91/136  lung]
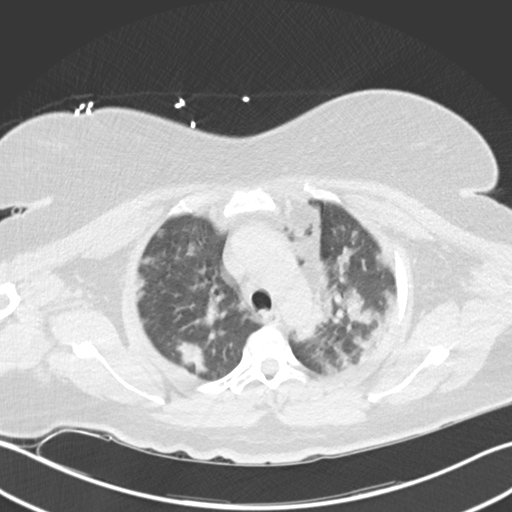
[im 102/136  mediastinal]
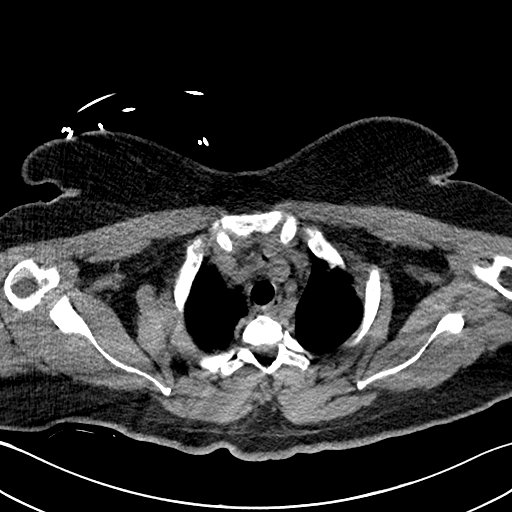
[im 102/136  lung]
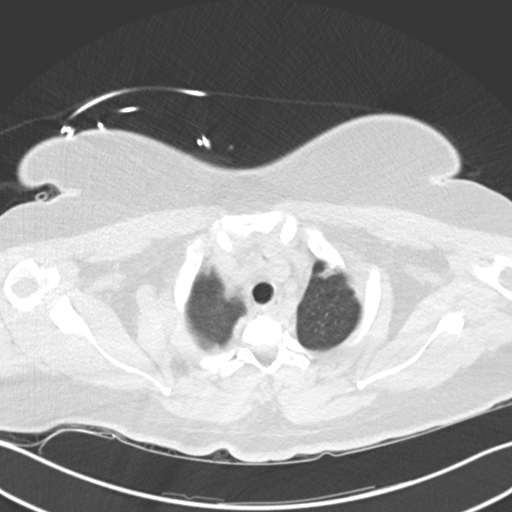
[im 113/136  lung]
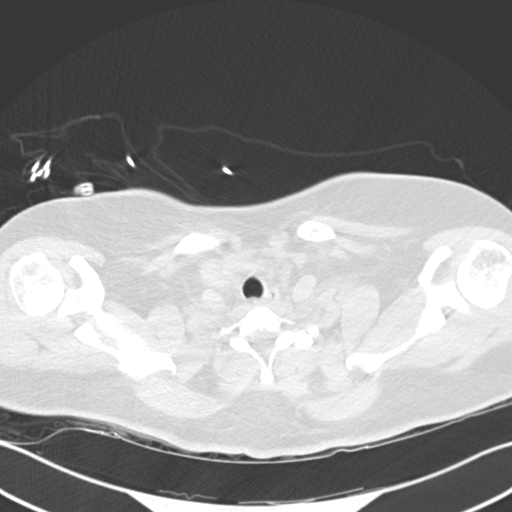
[im 124/136  lung]
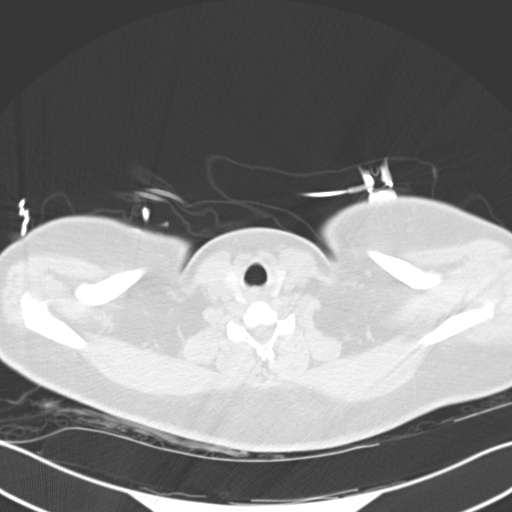

[Series 6: coronal · coronal · 0.59mm/px · 3 of 101 slices shown]
[im 21/101  lung]
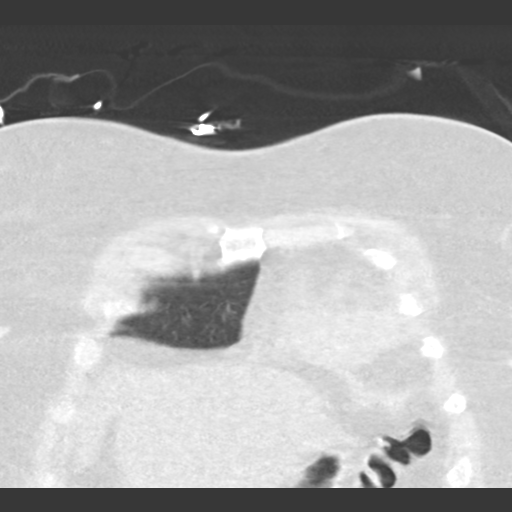
[im 41/101  lung]
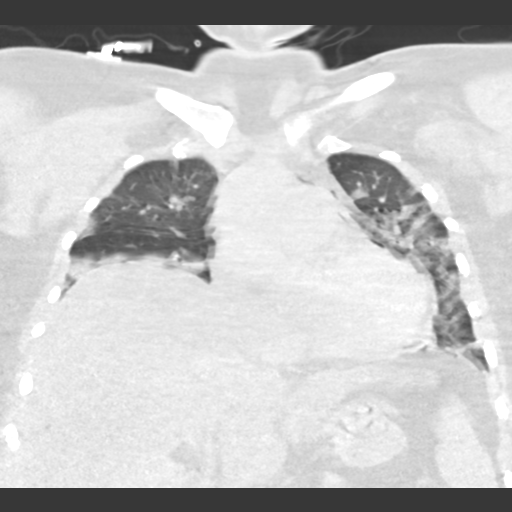
[im 61/101  lung]
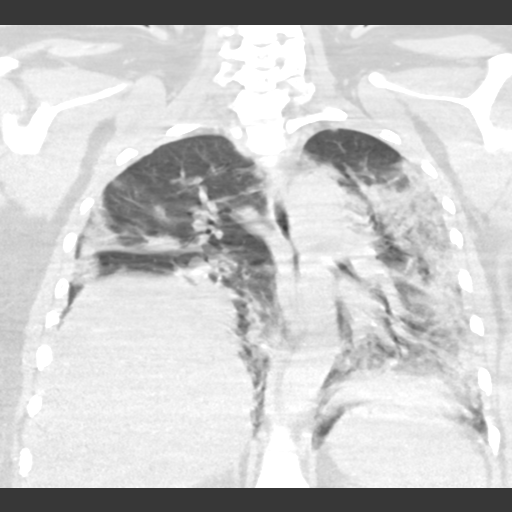

[14 of 36 positions shown; findings below may reference images not displayed]

FINDINGS: Motion degraded imaging.

Cardiovascular: Limited evaluation the absence of contrast media.
Normal heart size. No pericardial effusion. No clear pericardial gas
is seen to suggest pneumopericardium. The aorta is normal caliber.
Central pulmonary arteries are normal caliber. Luminal evaluation of
the vasculature precluded in the absence of contrast media.

Mediastinum/Nodes: Pneumoperitoneum predominantly within the
anterior mediastinum and tracking along the proximal great vessels
into the thoracic inlet. Findings in the neck are better detailed on
dedicated CT performed independently. No mediastinal fluid is seen.
No discernible acute abnormality or abnormal thickening of the
esophagus. Trachea is unremarkable. Thyroid gland is normal.

Lungs/Pleura: Extensive respiratory motion artifact. There
heterogeneous multifocal areas of mixed consolidative and
ground-glass opacity including multiple areas of more nodular
consolidation in the bilateral lungs. Chronic elevation of the right
hemidiaphragm is somewhat more pronounced than on comparison though
this may be related to atelectatic changes which are present as
well. Diffuse airways thickening is suspected. No visible
pneumothorax.

Upper Abdomen: No acute abnormalities present in the visualized
portions of the upper abdomen.

Musculoskeletal: Mild degenerative changes in the spine. No acute or
worrisome chest wall lesions.
IMPRESSION: 1. Severely motion degraded imaging.
2. Multifocal areas of mixed consolidative and ground-glass opacity
including multiple areas of more nodular consolidation in the
bilateral lungs, concerning for multifocal pneumonia in the setting
of AOOB7-KM positivity.
3. Pneumoperitoneum predominantly within the anterior mediastinum
and tracking along the proximal great vessels into the thoracic
inlet. No gross abnormality of the trachea or esophagus is seen
within the limitations of this unenhanced, motion degraded exam.
This may be related to barotrauma underlying airspace disease
particularly as the incidence of spontaneous pneumomediastinum has
been shown to have up to a 7 fold increase in patients diagnosed
with COVID 19.
4. Chronic elevation of the right hemidiaphragm is somewhat more
pronounced than on comparison though this may be related to
atelectatic changes which are present as well.

## 2020-12-27 ENCOUNTER — Encounter: Payer: Self-pay | Admitting: Student

## 2020-12-27 ENCOUNTER — Ambulatory Visit: Payer: Self-pay | Admitting: Student

## 2020-12-27 VITALS — BP 171/116 | HR 94 | Wt 258.8 lb

## 2020-12-27 DIAGNOSIS — I10 Essential (primary) hypertension: Secondary | ICD-10-CM

## 2020-12-27 DIAGNOSIS — E119 Type 2 diabetes mellitus without complications: Secondary | ICD-10-CM

## 2020-12-27 DIAGNOSIS — Z8639 Personal history of other endocrine, nutritional and metabolic disease: Secondary | ICD-10-CM

## 2020-12-27 DIAGNOSIS — Z0000 Encounter for general adult medical examination without abnormal findings: Secondary | ICD-10-CM

## 2020-12-27 DIAGNOSIS — E039 Hypothyroidism, unspecified: Secondary | ICD-10-CM

## 2020-12-27 LAB — GLUCOSE, CAPILLARY: Glucose-Capillary: 208 mg/dL — ABNORMAL HIGH (ref 70–99)

## 2020-12-27 LAB — POCT GLYCOSYLATED HEMOGLOBIN (HGB A1C): Hemoglobin A1C: 8.6 % — AB (ref 4.0–5.6)

## 2020-12-27 MED ORDER — LISINOPRIL 20 MG PO TABS: 20.0000 mg | ORAL_TABLET | Freq: Every day | ORAL | 1 refills | Status: DC

## 2020-12-27 MED ORDER — METFORMIN HCL 500 MG PO TABS: 500.0000 mg | ORAL_TABLET | Freq: Two times a day (BID) | ORAL | 11 refills | Status: DC

## 2020-12-27 NOTE — Progress Notes (Signed)
   CC: hypertension, diabetes follow-up  HPI:  [name] is a 40 y.o. with type 2 diabetes mellitus, hypertension, hyperlipidemia, previous provoked DVT 2/2 COVID-19, class III obesity presenting to [facility] for hypertension and diabetes follow-up.  Please see problem-based list for further details, assessments, and plans.  Past Medical History:  Diagnosis Date   Acute deep vein thrombosis (DVT) of calf muscle vein of right lower extremity (HCC)    Diabetes mellitus without complication (HCC)    Diabetic ketoacidosis (HCC)    E coli bacteremia    Hepatic steatosis    Hypertension    Hypertension in pregnancy, antepartum 12/11/2011   Borderline dx: no antenatal testing at this time (01/23/12)    Oral candidiasis    Pneumonia due to COVID-19 virus    Pyelonephritis    Review of Systems:  As per HPI  Physical Exam:  Vitals:   12/27/20 1334 12/27/20 1400  BP: (!) 158/112 (!) 171/116  Pulse: 94   SpO2: 98%   Weight: 258 lb 12.8 oz (117.4 kg)    General: Well-developed, well-nourished. No acute distress CV: Regular rate, rhythm. No murmurs, rubs, gallops.  Pulm: Normal work of breathing on room air. Clear to auscultation bilaterally. MSK: Normal bulk, tone. No pitting edema bilaterally. Skin: Warm, dry. No rashes appreciated. Psych: Normal mood, affect, speech.  Assessment & Plan:   See Encounters Tab for problem based charting.  Patient discussed with Dr. Oswaldo Done

## 2020-12-27 NOTE — Patient Instructions (Signed)
Tonya Simmons, me alegre de verle hoy!  He ordenado los siguientes laboratorios para usted:   Lab Orders  Glucose, capillary  TSH  POC Hbg A1C    Pruebas ordenadas hoy:  N/a  Referencias ordenadas hoy:   Referral Orders  Ambulatory referral to Optometry    He ordenado el siguiente medicamento/cambiado los siguientes medicamentos:  Suspender los siguientes medicamentos: Medications Discontinued During This Encounter  Medication Reason   lisinopril (ZESTRIL) 5 MG tablet    metFORMIN (GLUCOPHAGE) 500 MG tablet      Iniciar los siguientes medicamentos: Meds ordered this encounter  Medications   lisinopril (ZESTRIL) 20 MG tablet    Sig: Take 1 tablet (20 mg total) by mouth daily.    Dispense:  30 tablet    Refill:  1   metFORMIN (GLUCOPHAGE) 500 MG tablet    Sig: Take 1 tablet (500 mg total) by mouth 2 (two) times daily with a meal.    Dispense:  60 tablet    Refill:  11     Seguimiento  1 month     Recordar:  - Metformin 500mg  twice daily - Increase lisinopril 20mg  daily  Asegrese de llegar 15 minutos antes de su prxima cita. Si llega tarde, es posible que se .  Estamos deseando verle la proxima vez. Llama la clinica a [phone] si tiene preguntas o preocupaciones. El mejor tiempo para llamar es lunes a viernes, 9:00am - 4:00pm, pero hay alguien esta a su disposcion para lo que halga falta a cualquier hora. Si es fuera del horario de atencin o durante el fin de [place], llame al nmero principal del hospital y pregunte por el residente de guardia de medicina interna. SI necesita recambio de los [phone], llama su farmacia una semana antes de fecha de finalizacion de la receta. La farmacia nos llamara para la solicitud.  Gracias que nos permite tomar parte en su asistencia medica. Deseamos lo mejor!  [place], MD

## 2020-12-28 ENCOUNTER — Encounter: Payer: Self-pay | Admitting: Student

## 2020-12-28 LAB — TSH: TSH: 3.03 u[IU]/mL (ref 0.450–4.500)

## 2020-12-28 NOTE — Assessment & Plan Note (Signed)
Tonya Simmons would like to have her Pap smear done at the next visit in roughly one month

## 2020-12-28 NOTE — Assessment & Plan Note (Signed)
BP Readings from Last 3 Encounters:  12/27/20 (!) 171/116  10/10/20 (!) 144/105  05/08/20 (!) 167/119   Tonya Simmons's blood pressure continues to be uncontrolled. She does report compliance with her lisinopril and denies lightheadedness, dizziness, chest pain, dyspnea. Mentions that when she does take her blood pressure at home, it is usually elevated with systolic 130-140.   Given Tonya Simmons's current lisinopril dosage is not for hypertension control, will increase to initial anti-hypertensive dose. We will re-check blood pressure and titrate as needed in one month. - Increase lisinopril 20mg  daily - Return to clinic in one month - Continue blood pressure log at home

## 2020-12-28 NOTE — Assessment & Plan Note (Signed)
Since Tonya Simmons's last visit, she has been taking metformin 500mg  once daily without any insulin. Reports that her blood sugars have been a little higher lately, mainly in the upper 180-200's. She has not seen any number above 300 or below 100. She further describes being more stressed lately with family and work.  Denies any fatigue, nausea, vomiting, abdominal pain, neuropathic pain.   Given this patient has a history of ketoacidosis, will need to keep close watch of her sugars as she is not on insulin anymore. At this time will increase her metformin dosage. If A1c continues to elevate, can consider re-starting insulin at her next visit. - Increase metformin 500mg  twice daily - Ophthalmology referral - Return to clinic in three months

## 2020-12-28 NOTE — Assessment & Plan Note (Signed)
Ms. Vonna Drafts mentions that she has a history of thyroid disease, but has not taken her synthroid in years. Currently denies any hair or skin changes, palpitations, constipation, diarrhea, nausea, vomiting, heat/cold intolerance. Will re-check TSH today for further evaluation. - TSH today

## 2021-01-01 NOTE — Progress Notes (Signed)
Internal Medicine Clinic Attending ? ?Case discussed with Dr. Braswell  At the time of the visit.  We reviewed the resident?s history and exam and pertinent patient test results.  I agree with the assessment, diagnosis, and plan of care documented in the resident?s note.  ?

## 2021-04-09 NOTE — Assessment & Plan Note (Signed)
TSH normal on recheck in June despite not being on pharmacotherapy. Will resolve this problem.

## 2021-04-09 NOTE — Progress Notes (Signed)
   Office Visit   Patient ID: Tonya Simmons, female    DOB: June 03, 1981, 40 y.o.   MRN: [id number]   PCP: Evlyn Kanner, MD   Subjective:  Tonya Simmons is a 40 y.o. year old female who presents for follow up of chronic medical conditions including hypertension and diabetes. Please refer to problem based charting for assessment and plan.  An interpreter was used for the entirety of today's visit.  Name: Tonya Simmons; Interpreter #: [id number]   Objective:   BP (!) 159/116 (BP Location: Left Arm, Patient Position: Sitting, Cuff Size: Normal) Comment: patient did not take any meds today  Pulse (!) 101   Temp 98.5 F (36.9 C) (Oral)   Ht 5\' 3"  (1.6 m)   Wt 255 lb (115.7 kg)   SpO2 98%   BMI 45.17 kg/m  BP Readings from Last 3 Encounters:  04/10/21 (!) 159/116  12/27/20 (!) 171/116  10/10/20 (!) 144/105   General: obese, no distress Cardiac: RRR Pelvic exam (chaperoned by [facility]): normal external genetalia and vaginal walls. Normal cervix without discharge.  Assessment & Plan:   Problem List Items Addressed This Visit       Cardiovascular and Mediastinum   Hypertension associated with diabetes (HCC) (Chronic)    She has been out of her medications for the past 2 weeks. Blood pressure is largely above goal in the office today. BP Readings from Last 3 Encounters:  04/10/21 (!) 159/116  12/27/20 (!) 171/116  10/10/20 (!) 144/105   Plan Will d/c the lisinopril and start irbesartan-hctz 150-12.5mg  today BMP Follow up 4w for BP recheck and BMP BP goal <133mmHg      Relevant Medications   irbesartan-hydrochlorothiazide (AVALIDE) 150-12.5 MG tablet   Other Relevant Orders   Basic metabolic panel     Endocrine   Type 2 diabetes mellitus (HCC) (Chronic)    She has only been taking 500mg  metformin BID. NPH is on her medication list but she does not take it. A1C is   Plan Increase metformin to 1000mg  BID Start victoza 0.6mg  daily x 7d followed by 1.2mg   thereafter Follow up 35mo A1C goal <7      Relevant Medications   irbesartan-hydrochlorothiazide (AVALIDE) 150-12.5 MG tablet   Other Relevant Orders   POC Hbg A1C   Hyperlipidemia associated with type 2 diabetes mellitus (HCC) (Chronic)    Crestor 20mg  is on her medication list but she has not been taking it, stating the the doctor told her to stop taking it. Will recheck a lipid panel today and recommend resuming crestor.  Repeat lipid panel 53mo LDL goal: reduction by >50%      Relevant Medications   irbesartan-hydrochlorothiazide (AVALIDE) 150-12.5 MG tablet   Other Relevant Orders   Lipid panel   Other Visit Diagnoses     Cervical cancer screening    -  Primary   Relevant Orders   Cytology -Pap Smear      Return in about 4 weeks (around 05/08/2021) for Blood pressure follow up.   Pt discussed with Dr. 0mo, MD Internal Medicine Resident PGY-3 Internal Medicine Residency 04/10/2021 10:10 AM

## 2021-04-09 NOTE — Assessment & Plan Note (Signed)
Discuss VTE history and consideration to discontinue AC

## 2021-04-09 NOTE — Assessment & Plan Note (Addendum)
She has been out of her medications for the past 2 weeks. Blood pressure is largely above goal in the office today. BP Readings from Last 3 Encounters:  04/10/21 (!) 159/116  12/27/20 (!) 171/116  10/10/20 (!) 144/105   Plan  Will d/c the lisinopril and start irbesartan-hctz 150-12.5mg  today  BMP  Follow up 4w for BP recheck and BMP  BP goal <184mmHg

## 2021-04-09 NOTE — Assessment & Plan Note (Addendum)
She has only been taking 500mg  metformin BID. NPH is on her medication list but she does not take it. A1C is 9 today.  Foot exam is up to date.  Retinopathy screening is due.  Plan  Increase metformin to 1000mg  BID  Start victoza 0.6mg  daily x 7d followed by 1.2mg  thereafter  Referred to clinical pharmacy for medication education  Financial barriers precluding retinopathy screening  Follow up 23mo  A1C goal <7

## 2021-04-10 ENCOUNTER — Other Ambulatory Visit (HOSPITAL_COMMUNITY)
Admission: RE | Admit: 2021-04-10 | Discharge: 2021-04-10 | Disposition: A | Discharge status: Home / Self Care | Payer: Self-pay | Source: Ambulatory Visit | Attending: Internal Medicine | Admitting: Internal Medicine

## 2021-04-10 ENCOUNTER — Ambulatory Visit: Payer: Self-pay | Admitting: Internal Medicine

## 2021-04-10 ENCOUNTER — Other Ambulatory Visit: Payer: Self-pay

## 2021-04-10 ENCOUNTER — Encounter: Payer: Self-pay | Admitting: Internal Medicine

## 2021-04-10 VITALS — BP 159/116 | HR 101 | Temp 98.5°F | Ht 63.0 in | Wt 255.0 lb

## 2021-04-10 DIAGNOSIS — E1169 Type 2 diabetes mellitus with other specified complication: Secondary | ICD-10-CM

## 2021-04-10 DIAGNOSIS — E785 Hyperlipidemia, unspecified: Secondary | ICD-10-CM

## 2021-04-10 DIAGNOSIS — E1159 Type 2 diabetes mellitus with other circulatory complications: Secondary | ICD-10-CM

## 2021-04-10 DIAGNOSIS — E782 Mixed hyperlipidemia: Secondary | ICD-10-CM

## 2021-04-10 DIAGNOSIS — I10 Essential (primary) hypertension: Secondary | ICD-10-CM

## 2021-04-10 DIAGNOSIS — E119 Type 2 diabetes mellitus without complications: Secondary | ICD-10-CM

## 2021-04-10 DIAGNOSIS — I152 Hypertension secondary to endocrine disorders: Secondary | ICD-10-CM

## 2021-04-10 DIAGNOSIS — Z0000 Encounter for general adult medical examination without abnormal findings: Secondary | ICD-10-CM | POA: Insufficient documentation

## 2021-04-10 DIAGNOSIS — Z8639 Personal history of other endocrine, nutritional and metabolic disease: Secondary | ICD-10-CM

## 2021-04-10 DIAGNOSIS — I82461 Acute embolism and thrombosis of right calf muscular vein: Secondary | ICD-10-CM

## 2021-04-10 DIAGNOSIS — Z124 Encounter for screening for malignant neoplasm of cervix: Secondary | ICD-10-CM | POA: Insufficient documentation

## 2021-04-10 LAB — POCT GLYCOSYLATED HEMOGLOBIN (HGB A1C): Hemoglobin A1C: 9 % — AB (ref 4.0–5.6)

## 2021-04-10 LAB — GLUCOSE, CAPILLARY: Glucose-Capillary: 188 mg/dL — ABNORMAL HIGH (ref 70–99)

## 2021-04-10 MED ORDER — PEN NEEDLES 32G X 4 MM MISC: 1.0000 | Freq: Every day | 1 refills | Status: DC

## 2021-04-10 MED ORDER — LIRAGLUTIDE 18 MG/3ML ~~LOC~~ SOPN: PEN_INJECTOR | SUBCUTANEOUS | 1 refills | Status: DC

## 2021-04-10 MED ORDER — METFORMIN HCL 500 MG PO TABS: 1000.0000 mg | ORAL_TABLET | Freq: Two times a day (BID) | ORAL | 11 refills | Status: DC

## 2021-04-10 MED ORDER — IRBESARTAN-HYDROCHLOROTHIAZIDE 150-12.5 MG PO TABS: 1.0000 | ORAL_TABLET | Freq: Every day | ORAL | 2 refills | Status: DC

## 2021-04-10 NOTE — Assessment & Plan Note (Signed)
Pap smear performed in the office today.

## 2021-04-10 NOTE — Assessment & Plan Note (Addendum)
Crestor 20mg  is on her medication list but she has not been taking it, stating the the doctor told her to stop taking it.  Will recheck a lipid panel today and recommend resuming crestor.   Repeat lipid panel 48mo  LDL goal: reduction by >50%

## 2021-04-10 NOTE — Patient Instructions (Addendum)
Ms.Roshunda Espinoza-Palomino, it was a pleasure seeing you today!  Today we discussed:  Hypertension -Start taking irbesartan-hctz daily -Return in 4 weeks  Diabetes -increase metformin to 1000mg  (2 pills) twice a day -start Victoza. Inject 0.6mg  daily for 7 days, then 1.2mg  after that -I will have our pharmacist meet with you to show you how to inject.  Follow-up: 4 weeks  Please make sure to arrive 15 minutes prior to your next appointment. If you arrive late, you may be asked to reschedule.   We look forward to seeing you next time. Please call our clinic at [phone] if you have any questions or concerns. The best time to call is Monday-Friday from 9am-4pm, but there is someone available 24/7. If after hours or the weekend, call the main hospital number and ask for the Internal Medicine Resident On-Call. If you need medication refills, please notify your pharmacy one week in advance and they will send [phone] a request.  Thank you for letting us take part in your care. Wishing you the best!

## 2021-04-11 ENCOUNTER — Other Ambulatory Visit (HOSPITAL_COMMUNITY): Payer: Self-pay

## 2021-04-11 LAB — CYTOLOGY - PAP
Adequacy: ABSENT
Chlamydia: NEGATIVE
Comment: NEGATIVE
Comment: NEGATIVE
Comment: NORMAL
Diagnosis: NEGATIVE
High risk HPV: NEGATIVE
Neisseria Gonorrhea: NEGATIVE

## 2021-04-11 LAB — CERVICOVAGINAL ANCILLARY ONLY
Chlamydia: NEGATIVE
Comment: NEGATIVE
Comment: NORMAL
Neisseria Gonorrhea: NEGATIVE

## 2021-04-11 MED ORDER — METFORMIN HCL 500 MG PO TABS
1000.0000 mg | ORAL_TABLET | Freq: Two times a day (BID) | ORAL | 11 refills | Status: DC
  Filled 2021-04-11: qty 60, 15d supply, fill #0
  Filled 2021-05-15: qty 60, 15d supply, fill #1
  Filled 2021-06-15: qty 120, 30d supply, fill #2
  Filled 2021-07-17: qty 120, 30d supply, fill #3
  Filled 2021-08-20: qty 120, 30d supply, fill #4
  Filled 2021-09-21: qty 120, 30d supply, fill #5
  Filled 2021-10-24: qty 120, 30d supply, fill #6

## 2021-04-11 MED ORDER — INSULIN PEN NEEDLE 32G X 4 MM MISC
1.0000 | Freq: Every day | 1 refills | Status: DC
Start: 2021-04-11 — End: 2022-06-03
  Filled 2021-04-11: qty 100, 30d supply, fill #0
  Filled 2021-06-15: qty 100, 30d supply, fill #1

## 2021-04-11 MED ORDER — LIRAGLUTIDE 18 MG/3ML ~~LOC~~ SOPN: PEN_INJECTOR | SUBCUTANEOUS | 1 refills | Status: DC

## 2021-04-11 MED ORDER — LIRAGLUTIDE 18 MG/3ML ~~LOC~~ SOPN
PEN_INJECTOR | SUBCUTANEOUS | 1 refills | Status: DC
Start: 2021-04-11 — End: 2021-05-08
  Filled 2021-04-11: qty 6, 30d supply, fill #0

## 2021-04-11 MED ORDER — ROSUVASTATIN CALCIUM 20 MG PO TABS
20.0000 mg | ORAL_TABLET | Freq: Every day | ORAL | 11 refills | Status: DC
  Filled 2021-04-11: qty 30, 30d supply, fill #0
  Filled 2021-05-15: qty 30, 30d supply, fill #1
  Filled 2021-06-15: qty 30, 30d supply, fill #2
  Filled 2021-07-17: qty 30, 30d supply, fill #3
  Filled 2021-08-20: qty 30, 30d supply, fill #4
  Filled 2021-09-21: qty 30, 30d supply, fill #5
  Filled 2021-10-24: qty 30, 30d supply, fill #6
  Filled 2021-11-26: qty 30, 30d supply, fill #7

## 2021-04-11 MED ORDER — IRBESARTAN-HYDROCHLOROTHIAZIDE 150-12.5 MG PO TABS
1.0000 | ORAL_TABLET | Freq: Every day | ORAL | 2 refills | Status: DC
  Filled 2021-04-11: qty 30, 30d supply, fill #0

## 2021-04-11 NOTE — Progress Notes (Signed)
Results called to pt.  She notes that the victoza is too expensive at [facility]. She is agreeable to having all of her medications sent over to the [facility] outpatient pharmacy.

## 2021-04-11 NOTE — Addendum Note (Signed)
Addended by: Elige Radon on: 04/11/2021 04:53 PM   Modules accepted: Orders

## 2021-04-12 NOTE — Progress Notes (Signed)
Internal Medicine Clinic Attending  Case discussed with Dr. Christian  At the time of the visit.  We reviewed the resident's history and exam and pertinent patient test results.  I agree with the assessment, diagnosis, and plan of care documented in the resident's note.  

## 2021-04-17 ENCOUNTER — Other Ambulatory Visit (INDEPENDENT_AMBULATORY_CARE_PROVIDER_SITE_OTHER): Payer: Self-pay

## 2021-04-17 DIAGNOSIS — E785 Hyperlipidemia, unspecified: Secondary | ICD-10-CM

## 2021-04-17 DIAGNOSIS — E118 Type 2 diabetes mellitus with unspecified complications: Secondary | ICD-10-CM

## 2021-04-18 LAB — BASIC METABOLIC PANEL
BUN/Creatinine Ratio: 17 (ref 9–23)
BUN: 12 mg/dL (ref 6–20)
CO2: 20 mmol/L (ref 20–29)
Calcium: 9.4 mg/dL (ref 8.7–10.2)
Chloride: 98 mmol/L (ref 96–106)
Creatinine, Ser: 0.7 mg/dL (ref 0.57–1.00)
Glucose: 201 mg/dL — ABNORMAL HIGH (ref 70–99)
Potassium: 3.9 mmol/L (ref 3.5–5.2)
Sodium: 135 mmol/L (ref 134–144)
eGFR: 113 mL/min/{1.73_m2} (ref >59)

## 2021-04-18 LAB — LIPID PANEL
Chol/HDL Ratio: 4.4 ratio (ref 0.0–4.4)
Cholesterol, Total: 176 mg/dL (ref 100–199)
HDL: 40 mg/dL (ref >39)
LDL Chol Calc (NIH): 97 mg/dL (ref 0–99)
Triglycerides: 230 mg/dL — ABNORMAL HIGH (ref 0–149)
VLDL Cholesterol Cal: 39 mg/dL (ref 5–40)

## 2021-05-08 ENCOUNTER — Other Ambulatory Visit (HOSPITAL_COMMUNITY): Payer: Self-pay

## 2021-05-08 ENCOUNTER — Ambulatory Visit: Payer: Self-pay | Admitting: Student

## 2021-05-08 VITALS — BP 143/98 | HR 88 | Temp 98.2°F | Ht 63.0 in | Wt 249.7 lb

## 2021-05-08 DIAGNOSIS — E1159 Type 2 diabetes mellitus with other circulatory complications: Secondary | ICD-10-CM

## 2021-05-08 DIAGNOSIS — E119 Type 2 diabetes mellitus without complications: Secondary | ICD-10-CM

## 2021-05-08 DIAGNOSIS — Z139 Encounter for screening, unspecified: Secondary | ICD-10-CM

## 2021-05-08 DIAGNOSIS — E1169 Type 2 diabetes mellitus with other specified complication: Secondary | ICD-10-CM

## 2021-05-08 DIAGNOSIS — Z5989 Other problems related to housing and economic circumstances: Secondary | ICD-10-CM

## 2021-05-08 DIAGNOSIS — E785 Hyperlipidemia, unspecified: Secondary | ICD-10-CM

## 2021-05-08 DIAGNOSIS — I152 Hypertension secondary to endocrine disorders: Secondary | ICD-10-CM

## 2021-05-08 MED ORDER — IRBESARTAN-HYDROCHLOROTHIAZIDE 300-12.5 MG PO TABS
1.0000 | ORAL_TABLET | Freq: Every day | ORAL | 0 refills | Status: DC
  Filled 2021-05-08: qty 30, 30d supply, fill #0
  Filled 2021-06-15: qty 30, 30d supply, fill #1
  Filled 2021-07-17: qty 30, 30d supply, fill #2

## 2021-05-08 MED ORDER — LIRAGLUTIDE 18 MG/3ML ~~LOC~~ SOPN
PEN_INJECTOR | SUBCUTANEOUS | 1 refills | Status: DC
Start: 2021-05-08 — End: 2022-01-29
  Filled 2021-05-08: qty 6, 30d supply, fill #0
  Filled 2021-06-15: qty 6, 30d supply, fill #1
  Filled 2021-07-17 (×3): qty 6, 30d supply, fill #2

## 2021-05-08 NOTE — Progress Notes (Signed)
   CC: BP follow-up  HPI:  Ms.Amijah Espinoza-Palomino is a 40 y.o. Spanish-speaking female with PMH as below who presents to the clinic for follow-up on her blood pressure and diabetes. Please see problem based charting for evaluation, assessment and plan.  Interview was done through Spanish interpreter Rayburn Ma [phone]  Past Medical History:  Diagnosis Date   Acute deep vein thrombosis (DVT) of calf muscle vein of right lower extremity (HCC)    Acute deep vein thrombosis (DVT) of calf muscle vein of right lower extremity (HCC)    Diabetes mellitus without complication (HCC)    Diabetic ketoacidosis (HCC)    E coli bacteremia    Hepatic steatosis    Hypertension    Hypertension in pregnancy, antepartum 12/11/2011   Borderline dx: no antenatal testing at this time (01/23/12)    Oral candidiasis    Pneumonia due to COVID-19 virus    Pyelonephritis     Review of Systems:  Constitutional: Negative for fever or fatigue.  Eyes: Negative for visual changes Respiratory: Negative for shortness of breath Cardiac: Negative for chest pain MSK: Negative for back pain Abdomen: Negative for abdominal pain, constipation. Positive for vomiting Neuro: Negative for weakness or dizziness. Positive for headaches  Physical Exam: General: Pleasant, obese Spanish-speaking female. No acute distress. Cardiac: RRR. No murmurs, rubs or gallops. No LE edema Respiratory: Lungs CTAB. No wheezing or crackles. Decreased air movement at the bases. Abdominal: Soft, symmetric and non tender. Normal BS. Skin: Warm, dry and intact without rashes or lesions Extremities: Atraumatic. Full ROM. Palpable radial and DP pulses. Neuro: A&O x 3. Moves all extremities Psych: Appropriate mood and affect.  Vitals:   05/08/21 0952 05/08/21 1004  BP: (!) 134/100 (!) 143/98  Pulse: 94 88  Temp: 98.2 F (36.8 C)   TempSrc: Oral   SpO2: 100%   Weight: 249 lb 11.2 oz (113.3 kg)   Height: 5\' 3"  (1.6 m)      Assessment  & Plan:   See Encounters Tab for problem based charting.  Patient discussed with Dr.  , MD, MPH

## 2021-05-08 NOTE — Patient Instructions (Signed)
Thank you, [name] for allowing [place] to provide your care today. Today we discussed your diabetes, and blood pressure.  Your blood pressure is improving.  We have made some changes to your medications to help reduce side effects.  Please follow the instructions below.  I have ordered the following medication/changed the following medications:  For your Victoza, inject 0.6mg  daily for 14 days, followed by 1.2 mg thereafter Discontinue irbesartan HCTZ 150-12.5 mg daily Start irbesartan-HCTZ 300-12.5 mg daily  My Chart Access: [email or web address]  Please follow-up in 4 weeks  Please make sure to arrive 15 minutes prior to your next appointment. If you arrive late, you may be asked to reschedule.    We look forward to seeing you next time. Please call our clinic at [phone] if you have any questions or concerns. The best time to call is Monday-Friday from 9am-4pm, but there is someone available 24/7. If after hours or the weekend, call the main hospital number and ask for the Internal Medicine Resident On-Call. If you need medication refills, please notify your pharmacy one week in advance and they will send [place] a request.   Thank you for letting us take part in your care. Wishing you the best!  Steffanie Rainwater, MD 05/08/2021, 11:20 AM IM Resident, PGY-2  Tonya Simmons por permitirnos brindarle su atencin hoy. Hoy hablamos de su diabetes y presin arterial. Su presin arterial est mejorando. Hemos realizado algunos cambios en sus medicamentos para ayudar a Administrator, Civil Service. Por favor, siga las siguientes instrucciones.  He ordenado el siguiente medicamento/cambiado los siguientes medicamentos: 1. Para su Victoza, inyecte 0,6 mg al da durante [address] seguido de 1,2 mg a partir de entonces 2. Suspender irbesartn HCTZ 150-12,5 mg al da 3. Iniciar irbesartn-HCTZ 300-12,5 mg al  da  [employer] a mi grfico: [email or web address]  Por favor, seguimiento en 4 semanas  Asegrese de llegar 15 minutos antes de su prxima cita. Si llega tarde, es posible que se Armed forces operational officer.  Esperamos verte la prxima vez. Llame a nuestra clnica al [phone] si tiene alguna pregunta o inquietud. El mejor horario para llamar es de lunes a viernes de 9 a. m. a 4 p. m., pero hay alguien disponible las 24 horas del da, los 7 [address]  [address] de la [place]. Si es fuera del horario de atencin o durante el fin de [place], llame al nmero principal del hospital y pregunte por el residente de guardia de medicina interna. Si necesita reposicin de medicamentos, por favor notifique a su farmacia con una semana de anticipacin y ellos nos enviarn una solicitud.  Gracias por dejarnos participar en su cuidado. Deseandote lo mejor!  Steffanie Rainwater, MD 25/04/2021, 11:20 Residente MI, PGY-2 Isaas 41:10 [place] 41:10

## 2021-05-09 ENCOUNTER — Encounter: Payer: Self-pay | Admitting: Student

## 2021-05-09 ENCOUNTER — Telehealth: Payer: Self-pay | Admitting: *Deleted

## 2021-05-09 NOTE — Assessment & Plan Note (Signed)
Patient has lost 6 pounds in the last 4 weeks.  States she has been reducing her intake of carbs and sugary foods.  Remains on a high intensity statin with Crestor. -- Continue Crestor 20 mg daily -- Counseled again on the importance of exercising 2-3 times a week and reducing intake of carbs and fried foods -- 4-week follow-up

## 2021-05-09 NOTE — Assessment & Plan Note (Signed)
She has made significant improvement in her BP but still not at goal. She endorse mild headaches since starting Victoza but denies any blurry vision or dizziness.  BMP 3 weeks ago showed normal kidney function. Vitals:   05/08/21 0952 05/08/21 1004  BP: (!) 134/100 (!) 143/98   Plan: -- Increase irbesartan-HCTZ to 300-12.5 mg daily -- 4 weeks follow-up for BP checks -- Add amlodipine to regimen if BP still not controlled at next office visit

## 2021-05-09 NOTE — Chronic Care Management (AMB) (Signed)
  Care Management   Note  05/09/2021 Name: Tonya Simmons MRN: [id number] DOB: 10/02/1980  Tonya Simmons is a 40 y.o. year old female who is a primary care patient of Evlyn Kanner, MD. I reached out to [employer]	[place] by phone today using [employer] ID# [phone] named Debarah Crape in response to a referral sent by Tonya Simmons's primary care provider.   Tonya Simmons was given information about care management services today including:  Care management services include personalized support from designated clinical staff supervised by her physician, including individualized plan of care and coordination with other care providers 24/7 contact phone numbers for assistance for urgent and routine care needs. The patient may stop care management services at any time by phone call to the office staff.  Patient agreed to services and verbal consent obtained.   Follow up plan: Telephone appointment with care management team member scheduled for:05/15/21  [facility] Guide, Embedded Care Coordination [facility]  Care Management  Direct Dial: [phone]

## 2021-05-09 NOTE — Assessment & Plan Note (Addendum)
A1c of 9% during last visit. Patient's metformin was increased to 1000 mg twice daily and started on Victoza.  Patient reports she has been injecting 1.2 mg of the Victoza instead of starting with a 0.6 mg daily as instructed.  She started having mild headaches and vomiting about a 2-3 times a week after starting the Victoza. She reports no other symptoms.  Patient agreeable to going down on the dose of Victoza and escalating 12.5 mg slowly.  Plan: -- Decrease Victoza to 0.6 mg daily for 14 days then 1.2 mg daily afterward -- Continue metformin 1000 mg twice daily -- 4-week follow-up for re-evaluation -- Referral to community care coordination to help with insurance and access

## 2021-05-15 ENCOUNTER — Other Ambulatory Visit (HOSPITAL_COMMUNITY): Payer: Self-pay

## 2021-05-15 ENCOUNTER — Ambulatory Visit: Payer: Self-pay | Admitting: Licensed Clinical Social Worker

## 2021-05-15 NOTE — Chronic Care Management (AMB) (Signed)
  Care Management   Social Work Visit Note  05/15/2021 Name: Symphani Eckstrom MRN: [id number] DOB: [birth year]  Candid Bovey is a 40 y.o. year old female who sees Evlyn Kanner, MD for primary care. The care management team was consulted for assistance with care management and care coordination needs related to [employer]    Patient was unable to talk and requested a call back for tomorrow. Patient will also need a spanish Medical sales representative.   SW scheduled appointment for 11/02    Christen Butter, BSW  Social Worker [facility] Care Management  [phone]

## 2021-05-16 ENCOUNTER — Ambulatory Visit: Payer: Self-pay | Admitting: Licensed Clinical Social Worker

## 2021-05-16 NOTE — Patient Instructions (Signed)
Visit Information  Instructions:   Patient was given the following information about care management and care coordination services today, agreed to services, and gave verbal consent: 1.care management/care coordination services include personalized support from designated clinical staff supervised by their physician, including individualized plan of care and coordination with other care providers 2. 24/7 contact phone numbers for assistance for urgent and routine care needs. 3. The patient may stop care management/care coordination services at any time by phone call to the office staff.  Patient verbalizes understanding of instructions provided today and agrees to view in MyChart.   No further follow up is needed.   [place]ton Urieta, BSW  Social Worker [facility] Care Management  [phone]      

## 2021-05-16 NOTE — Chronic Care Management (AMB) (Signed)
  Care Management   Social Work Visit Note  05/16/2021 Name: Tonya Simmons MRN: [id number] DOB: 1981/07/05  Tonya Simmons is a 40 y.o. year old female who sees Evlyn Kanner, MD for primary care. The care management team was consulted for assistance with care management and care coordination needs related to [facility] Resources    Patient was given the following information about care management and care coordination services today, agreed to services, and gave verbal consent: 1.care management/care coordination services include personalized support from designated clinical staff supervised by their physician, including individualized plan of care and coordination with other care providers 2. 24/7 contact phone numbers for assistance for urgent and routine care needs. 3. The patient may stop care management/care coordination services at any time by phone call to the office staff.  Engaged with patient by telephone for initial visit in response to provider referral for social work chronic care management and care coordination services.  Assessment: Review of patient history, allergies, and health status during evaluation of patient need for care management/care coordination services.    Interventions:  Patient interviewed and appropriate assessments performed Collaborated with clinical team regarding patient needs  SW contacted language [employer] to assist with spanish translation. Representative [phone] assisted.  Patient declined needing assistance with; food, transportation, housing and orange card Insurance. SW reviewed orange card application and told patient to mail form. Patient advised she understood.  SDOH completed and SW unable to determine any needs for patient. Patient denied needing assistance. SW provided contact information and advise patient to reach out in the future if needed.   SDOH (Social Determinants of Health) assessments performed: Yes      Plan:  No follow up needed.   Christen Butter, BSW  Social Worker [facility]  [phone]

## 2021-06-04 NOTE — Progress Notes (Signed)
Internal Medicine Clinic Attending  Case discussed with Dr. Amponsah  At the time of the visit.  We reviewed the resident's history and exam and pertinent patient test results.  I agree with the assessment, diagnosis, and plan of care documented in the resident's note.  

## 2021-06-05 ENCOUNTER — Encounter: Payer: Self-pay | Admitting: Student

## 2021-06-06 ENCOUNTER — Encounter: Payer: Self-pay | Admitting: Student

## 2021-06-15 ENCOUNTER — Other Ambulatory Visit (HOSPITAL_COMMUNITY): Payer: Self-pay

## 2021-07-17 ENCOUNTER — Other Ambulatory Visit (HOSPITAL_COMMUNITY): Payer: Self-pay

## 2021-07-18 ENCOUNTER — Other Ambulatory Visit (HOSPITAL_COMMUNITY): Payer: Self-pay

## 2021-08-20 ENCOUNTER — Other Ambulatory Visit: Payer: Self-pay | Admitting: Student

## 2021-08-20 ENCOUNTER — Other Ambulatory Visit (HOSPITAL_COMMUNITY): Payer: Self-pay

## 2021-08-20 MED ORDER — IRBESARTAN-HYDROCHLOROTHIAZIDE 300-12.5 MG PO TABS
1.0000 | ORAL_TABLET | Freq: Every day | ORAL | 0 refills | Status: DC
  Filled 2021-08-20: qty 30, 30d supply, fill #0
  Filled 2021-09-21: qty 30, 30d supply, fill #1
  Filled 2021-10-24: qty 30, 30d supply, fill #2

## 2021-08-21 ENCOUNTER — Other Ambulatory Visit (HOSPITAL_COMMUNITY): Payer: Self-pay

## 2021-09-21 ENCOUNTER — Other Ambulatory Visit (HOSPITAL_COMMUNITY): Payer: Self-pay

## 2021-10-24 ENCOUNTER — Other Ambulatory Visit (HOSPITAL_COMMUNITY): Payer: Self-pay

## 2021-11-26 ENCOUNTER — Other Ambulatory Visit (HOSPITAL_COMMUNITY): Payer: Self-pay

## 2021-11-26 ENCOUNTER — Other Ambulatory Visit: Payer: Self-pay | Admitting: Student

## 2021-11-26 ENCOUNTER — Other Ambulatory Visit: Payer: Self-pay | Admitting: Internal Medicine

## 2021-11-26 DIAGNOSIS — E119 Type 2 diabetes mellitus without complications: Secondary | ICD-10-CM

## 2021-11-26 MED ORDER — IRBESARTAN-HYDROCHLOROTHIAZIDE 300-12.5 MG PO TABS
1.0000 | ORAL_TABLET | Freq: Every day | ORAL | 2 refills | Status: DC
  Filled 2021-11-26: qty 30, 30d supply, fill #0

## 2021-11-26 MED ORDER — METFORMIN HCL 500 MG PO TABS
1000.0000 mg | ORAL_TABLET | Freq: Two times a day (BID) | ORAL | 6 refills | Status: DC
  Filled 2021-11-26: qty 120, 30d supply, fill #0

## 2021-11-26 NOTE — Telephone Encounter (Signed)
Called pt to schedule an appt - no answer. ?I will ask front office  to schedule pt an appt ?

## 2021-11-26 NOTE — Telephone Encounter (Signed)
Called pt to schedule an appt - no answer. ?I will ask front office  to schedule pt an appt ?

## 2022-01-29 ENCOUNTER — Ambulatory Visit: Payer: Self-pay | Admitting: Student

## 2022-01-29 ENCOUNTER — Other Ambulatory Visit (HOSPITAL_COMMUNITY): Payer: Self-pay

## 2022-01-29 ENCOUNTER — Encounter: Payer: Self-pay | Admitting: Student

## 2022-01-29 VITALS — BP 152/105 | HR 103 | Wt 250.2 lb

## 2022-01-29 DIAGNOSIS — Z7984 Long term (current) use of oral hypoglycemic drugs: Secondary | ICD-10-CM

## 2022-01-29 DIAGNOSIS — I152 Hypertension secondary to endocrine disorders: Secondary | ICD-10-CM

## 2022-01-29 DIAGNOSIS — E1159 Type 2 diabetes mellitus with other circulatory complications: Secondary | ICD-10-CM

## 2022-01-29 DIAGNOSIS — E782 Mixed hyperlipidemia: Secondary | ICD-10-CM

## 2022-01-29 DIAGNOSIS — E1169 Type 2 diabetes mellitus with other specified complication: Secondary | ICD-10-CM

## 2022-01-29 DIAGNOSIS — E119 Type 2 diabetes mellitus without complications: Secondary | ICD-10-CM

## 2022-01-29 LAB — GLUCOSE, CAPILLARY: Glucose-Capillary: 164 mg/dL — ABNORMAL HIGH (ref 70–99)

## 2022-01-29 LAB — POCT GLYCOSYLATED HEMOGLOBIN (HGB A1C): Hemoglobin A1C: 8.8 % — AB (ref 4.0–5.6)

## 2022-01-29 MED ORDER — LIRAGLUTIDE 18 MG/3ML ~~LOC~~ SOPN
0.6000 mg | PEN_INJECTOR | Freq: Every day | SUBCUTANEOUS | 1 refills | Status: DC
  Filled 2022-01-29: qty 6, 30d supply, fill #0
  Filled 2022-03-01: qty 6, 30d supply, fill #1
  Filled 2022-03-28: qty 6, 60d supply, fill #2

## 2022-01-29 MED ORDER — IRBESARTAN-HYDROCHLOROTHIAZIDE 300-12.5 MG PO TABS
1.0000 | ORAL_TABLET | Freq: Every day | ORAL | 2 refills | Status: DC
  Filled 2022-01-29: qty 30, 30d supply, fill #0
  Filled 2022-01-29: qty 90, 90d supply, fill #0
  Filled 2022-03-01: qty 30, 30d supply, fill #1
  Filled 2022-03-28: qty 30, 30d supply, fill #2
  Filled 2022-05-01: qty 30, 30d supply, fill #3
  Filled 2022-05-31: qty 30, 30d supply, fill #4
  Filled 2022-07-02: qty 30, 30d supply, fill #5
  Filled 2022-08-02: qty 30, 30d supply, fill #6
  Filled 2022-09-06: qty 30, 30d supply, fill #7
  Filled 2022-10-07: qty 30, 30d supply, fill #8

## 2022-01-29 MED ORDER — METFORMIN HCL 500 MG PO TABS
1000.0000 mg | ORAL_TABLET | Freq: Two times a day (BID) | ORAL | 6 refills | Status: DC
  Filled 2022-01-29: qty 81, 20d supply, fill #0
  Filled 2022-01-29: qty 39, 10d supply, fill #0
  Filled 2022-03-01: qty 120, 30d supply, fill #1
  Filled 2022-03-28: qty 120, 30d supply, fill #2
  Filled 2022-05-01: qty 120, 30d supply, fill #3
  Filled 2022-05-31: qty 120, 30d supply, fill #4
  Filled 2022-07-02: qty 120, 30d supply, fill #5
  Filled 2022-08-02: qty 120, 30d supply, fill #6

## 2022-01-29 MED ORDER — ROSUVASTATIN CALCIUM 20 MG PO TABS
20.0000 mg | ORAL_TABLET | Freq: Every day | ORAL | 11 refills | Status: DC
  Filled 2022-01-29: qty 30, 30d supply, fill #0
  Filled 2022-03-01: qty 30, 30d supply, fill #1
  Filled 2022-03-28: qty 30, 30d supply, fill #2
  Filled 2022-05-01: qty 30, 30d supply, fill #3
  Filled 2022-05-31: qty 30, 30d supply, fill #4
  Filled 2022-07-02: qty 30, 30d supply, fill #5
  Filled 2022-08-02: qty 30, 30d supply, fill #6
  Filled 2022-09-06: qty 30, 30d supply, fill #7
  Filled 2022-10-07: qty 30, 30d supply, fill #8
  Filled 2022-11-07 (×2): qty 30, 30d supply, fill #9
  Filled 2022-12-11: qty 30, 30d supply, fill #10
  Filled 2023-01-13: qty 30, 30d supply, fill #11

## 2022-01-29 NOTE — Assessment & Plan Note (Signed)
Pt's last Lipid panel was done on 04/2021, and showed a cholesterol of 176, LDL of 97, TG of 230, and HDL of 40. She is currently on Rosuvastatin 20 mg. She denies any claudication or chest pain.   Plan:  1. Refilled Rosuvastatin  2. Pt has poor compliance with this as well, will evaluate lipids at next visit (once a year)

## 2022-01-29 NOTE — Assessment & Plan Note (Addendum)
Patient's A1c was done today and revealed a value of 8.8%, which is virtually the same as her last A1c of 9% in September of 2022. She is currently on Victoza .6mg , and metformin 500 mg for which she takes two pills in the morning and two in the afternoon. Pt has poor compliance with medications. Prior to reordering medications, her Victoza was canceled in January, and Metformin around the same time. She would forget to take her medications, and has had some left over which she has taken intermittently. She has run out of her medications and has requested refills.    On physical exam there were no blisters or ulcers on feet, and distal pulses were palpated +2  Plan:  - Patient's A1c today was 8.8%, the lack of significant change could be due to poor compliance with her medications. Will refill all her medications and re-evaluate her A1c at next visit In 3 months.  - Refill medications  - Encourage patient compliance with medication

## 2022-01-29 NOTE — Progress Notes (Addendum)
CC: T2DM Follow up   HPI:  Tonya Simmons is a 41 y.o. female living with a history stated below and presents today for T2DM follow up. Please see problem based assessment and plan for additional details.  Patient seen with interpreter Aurelio Brash.   Past Medical History:  Diagnosis Date   Acute deep vein thrombosis (DVT) of calf muscle vein of right lower extremity (HCC)    Acute deep vein thrombosis (DVT) of calf muscle vein of right lower extremity (HCC)    Diabetes mellitus without complication (HCC)    Diabetic ketoacidosis (HCC)    E coli bacteremia    Hepatic steatosis    Hypertension    Hypertension in pregnancy, antepartum 12/11/2011   Borderline dx: no antenatal testing at this time (01/23/12)    Oral candidiasis    Pneumonia due to COVID-19 virus    Pyelonephritis     Current Outpatient Medications on File Prior to Visit  Medication Sig Dispense Refill   Insulin Pen Needle 32G X 4 MM MISC Use to inject victoza daily 400 each 1   No current facility-administered medications on file prior to visit.    Family History  Problem Relation Age of Onset   Hypertension Father    Stroke Father    Osteoporosis Mother     Social History   Socioeconomic History   Marital status: Significant Other    Spouse name: Not on file   Number of children: Not on file   Years of education: Not on file   Highest education level: Not on file  Occupational History   Not on file  Tobacco Use   Smoking status: Never   Smokeless tobacco: Never  Substance and Sexual Activity   Alcohol use: No   Drug use: No   Sexual activity: Yes    Birth control/protection: None  Other Topics Concern   Not on file  Social History Narrative   Not on file   Social Determinants of Health   Financial Resource Strain: High Risk (05/16/2021)   Overall Financial Resource Strain (CARDIA)    Difficulty of Paying Living Expenses: Hard  Food Insecurity: No Food Insecurity  (05/16/2021)   Hunger Vital Sign    Worried About Running Out of Food in the Last Year: Never true    Ran Out of Food in the Last Year: Never true  Transportation Needs: No Transportation Needs (05/16/2021)   PRAPARE - Administrator, Civil Service (Medical): No    Lack of Transportation (Non-Medical): No  Physical Activity: Not on file  Stress: Not on file  Social Connections: Not on file  Intimate Partner Violence: Not on file    Review of Systems: ROS negative except for what is noted on the assessment and plan.  Vitals:   01/29/22 1047 01/29/22 1125  BP: (!) 152/102 (!) 152/105  Pulse: (!) 103 (!) 103  SpO2: 98%   Weight: 250 lb 3.2 oz (113.5 kg)     Physical Exam: Constitutional: Well-Appearing, obese woman sitting comfortably, in no acute distress Cardiovascular: regular rate and rhythm, no m/r/g, distal pulses palpated +2 Pulmonary/Chest: normal work of breathing on room air, lungs clear to auscultation bilaterally MSK: normal bulk and tone Neurological: alert & oriented x 3, 5/5 strength in bilateral upper and lower extremities, normal gait, full sensation in hands and feet, no ulcers or blisters on feet Skin: warm and dry   Assessment & Plan:   Type 2 diabetes mellitus (HCC) Patient's A1c  was done today and revealed a value of 8.8%, which is virtually the same as her last A1c of 9% in September of 2022. She is currently on Victoza .6mg , and metformin 500 mg for which she takes two pills in the morning and two in the afternoon. Pt has poor compliance with medications. Prior to reordering medications, her Victoza was canceled in January, and Metformin around the same time. She would forget to take her medications, and has had some left over which she has taken intermittently. She has run out of her medications and has requested refills.    On physical exam there were no blisters or ulcers on feet, and distal pulses were palpated +2  Plan:  - Patient's A1c  today was 8.8%, the lack of significant change could be due to poor compliance with her medications. Will refill all her medications and re-evaluate her A1c at next visit In 3 months.  - Refill medications  - Encourage patient compliance with medication    Hypertension associated with diabetes (HCC) Pt blood pressure today was 152/102, and upon recheck was 152/105. Her BMI is 44.32. She does not check her blood pressure at home. ASCVD Risk is currently  3%. Her last BMP was done in 04/2021. She is currently on Irbesartan-HCTZ 300/12.5. Pt has poor medication compliance, and often forgets. I refilled this today.  Plan:   1. Blood pressure may be elevated because of her lack of compliance with the regimen, will evaluate BP again at her next visit.  2. Refill medication 3. Encourage patient compliance  4. BMP was done today to assess kidney function, will call patient and update with results.      Hyperlipidemia associated with type 2 diabetes mellitus (HCC) Pt's last Lipid panel was done on 04/2021, and showed a cholesterol of 176, LDL of 97, TG of 230, and HDL of 40. She is currently on Rosuvastatin 20 mg. She denies any claudication or chest pain.   Plan:  1. Refilled Rosuvastatin  2. Pt has poor compliance with this as well, will evaluate lipids at next visit (once a year)    Patient seen with Dr. 05/2021, M.D. [facility] Internal Medicine, PGY-1 Phone: [phone] Date 01/29/2022 Time 7:07 PM

## 2022-01-29 NOTE — Progress Notes (Signed)
8

## 2022-01-29 NOTE — Patient Instructions (Addendum)
Thank you so much for coming into the clinic today!   We refilled all of  your medications, here is a list:   Victoza (for diabetes)  Metformin (for diabetes)  Rosuvastatin (for cholesterol)  And Irbesaratan-HCTZ (For blood pressure)   Please pick these up at your earliest convenience from the [facility]! And please continue to start dieting and exercising. If you have any questions, please call the clinic at [phone].  Best, Dr. Jason Fila [place] Zou    Muchas gracias por venir a la clnica hoy!  Resurtimos todos sus medicamentos, aqu hay una lista:  Victoza (para la diabetes) Metformina (para la diabetes) Rosuvastatina (para el colesterol) E Irbesaratan-HCTZ (Para la presin arterial)  Recjalos lo antes posible en la farmacia para pacientes ambulatorios de [place]! Y, por favor, contina haciendo dieta y haciendo ejercicio. Si tiene [place], llame a la clnica al [phone].  Mejor, Dr. Jason Fila [place] Caldera

## 2022-01-29 NOTE — Assessment & Plan Note (Addendum)
Pt blood pressure today was 152/102, and upon recheck was 152/105. Her BMI is 44.32. She does not check her blood pressure at home. ASCVD Risk is currently  3%. Her last BMP was done in 04/2021. She is currently on Irbesartan-HCTZ 300/12.5. Pt has poor medication compliance, and often forgets. I refilled this today.  Plan:   1. Blood pressure may be elevated because of her lack of compliance with the regimen, will evaluate BP again at her next visit.  2. Refill medication 3. Encourage patient compliance  4. BMP was done today to assess kidney function, will call patient and update with results.

## 2022-01-30 LAB — BMP8+ANION GAP
Anion Gap: 16 mmol/L (ref 10.0–18.0)
BUN/Creatinine Ratio: 15 (ref 9–23)
BUN: 11 mg/dL (ref 6–24)
CO2: 22 mmol/L (ref 20–29)
Calcium: 10 mg/dL (ref 8.7–10.2)
Chloride: 100 mmol/L (ref 96–106)
Creatinine, Ser: 0.73 mg/dL (ref 0.57–1.00)
Glucose: 165 mg/dL — ABNORMAL HIGH (ref 70–99)
Potassium: 4.7 mmol/L (ref 3.5–5.2)
Sodium: 138 mmol/L (ref 134–144)
eGFR: 107 mL/min/{1.73_m2} (ref >59)

## 2022-01-31 ENCOUNTER — Telehealth: Payer: Self-pay | Admitting: Student

## 2022-01-31 NOTE — Progress Notes (Signed)
Internal Medicine Clinic Attending  I saw and evaluated the patient.  I personally confirmed the key portions of the history and exam documented by Dr. Thomasene Simmons and I reviewed pertinent patient test results.  The assessment, diagnosis, and plan were formulated together and I agree with the documentation in the resident's note. [name] reports medication adherence although when we reviewed fill history she does note she was taking medications only intermittently. We discussed contacting the clinic for refills as needed. Will resume metformin and liraglutide for diabetes management, resume liraglutide at lowest dose and titrate up to avoid SE. Refill of BP medications today.

## 2022-01-31 NOTE — Telephone Encounter (Signed)
Called patient on the phone today to discuss lab results, reassured her everything was looking good and checked in on her blood pressure, which she claims is in the 130 range now.

## 2022-03-01 ENCOUNTER — Other Ambulatory Visit (HOSPITAL_COMMUNITY): Payer: Self-pay

## 2022-03-25 LAB — HM DIABETES EYE EXAM

## 2022-03-28 ENCOUNTER — Other Ambulatory Visit (HOSPITAL_COMMUNITY): Payer: Self-pay

## 2022-04-01 ENCOUNTER — Encounter: Payer: Self-pay | Admitting: Student

## 2022-04-01 ENCOUNTER — Ambulatory Visit (INDEPENDENT_AMBULATORY_CARE_PROVIDER_SITE_OTHER): Payer: Self-pay | Admitting: Student

## 2022-04-01 ENCOUNTER — Other Ambulatory Visit: Payer: Self-pay

## 2022-04-01 VITALS — BP 167/104 | HR 104 | Temp 98.6°F | Wt 251.0 lb

## 2022-04-01 DIAGNOSIS — R112 Nausea with vomiting, unspecified: Secondary | ICD-10-CM

## 2022-04-01 DIAGNOSIS — Z7984 Long term (current) use of oral hypoglycemic drugs: Secondary | ICD-10-CM

## 2022-04-01 DIAGNOSIS — E1159 Type 2 diabetes mellitus with other circulatory complications: Secondary | ICD-10-CM

## 2022-04-01 DIAGNOSIS — E1169 Type 2 diabetes mellitus with other specified complication: Secondary | ICD-10-CM

## 2022-04-01 DIAGNOSIS — I152 Hypertension secondary to endocrine disorders: Secondary | ICD-10-CM

## 2022-04-01 NOTE — Patient Instructions (Signed)
Ms.Humaira Espinoza-Palomino, it was a pleasure seeing you today!  Today we discussed: - Tu presin arterial estaba alta hoy, pero contina tomndote la presin en casa! Contine con su medicacin actual.  - Me alegro que tus azcares se vean bien! Contine con sus medicamentos actuales.  Marlou Starks en un mes y volveremos a comprobar su A1c.  - Your blood pressure was high today, but continue taking your pressures at home! Continue with your current medication.  - I am glad your sugars look good! Continue with your current medications.   - Come back in a month and we will re-check your A1c.  I have ordered the following labs today:   Lab Orders         BMP8+Anion Gap         Microalbumin / Creatinine Urine Ratio      Follow-up:  1 month    Please make sure to arrive 15 minutes prior to your next appointment. If you arrive late, you may be asked to reschedule.   We look forward to seeing you next time. Please call our clinic at [phone] if you have any questions or concerns. The best time to call is Monday-Friday from 9am-4pm, but there is someone available 24/7. If after hours or the weekend, call the main hospital number and ask for the Internal Medicine Resident On-Call. If you need medication refills, please notify your pharmacy one week in advance and they will send [place] a request.  Thank you for letting us take part in your care. Wishing you the best!  Thank you, Sanjuan Dame, MD

## 2022-04-02 DIAGNOSIS — R112 Nausea with vomiting, unspecified: Secondary | ICD-10-CM | POA: Insufficient documentation

## 2022-04-02 NOTE — Assessment & Plan Note (Signed)
During last visit, found to have A1c 8.8%. She does report compliance with her medications. She has had mild nausea and vomiting over the last two days, but she has been tolerating her medications well.  [name] also brought a log of her sugars, which appear to all be at goal. She denies any hypoglycemic episodes.   Will plan to continue current regimen. We have obtained microalbumin/creatinine ratio today. She is to return in one month for an A1c re-check.  - Continue metformin 1000mg  twice daily - Victoza 0.6mg  daily - Repeat A1c in one month

## 2022-04-02 NOTE — Progress Notes (Signed)
CC: blood pressure, diabetes follow-up  HPI:  Tonya Simmons is a 41 y.o. person with hypertension, type II diabetes presenting to [facility] for follow-up.  Please see problem-based list for further details, assessments, and plans.  Past Medical History:  Diagnosis Date   Acute deep vein thrombosis (DVT) of calf muscle vein of right lower extremity (HCC)    Acute deep vein thrombosis (DVT) of calf muscle vein of right lower extremity (HCC)    Diabetes mellitus without complication (HCC)    Diabetic ketoacidosis (HCC)    E coli bacteremia    Hepatic steatosis    Hypertension    Hypertension in pregnancy, antepartum 12/11/2011   Borderline dx: no antenatal testing at this time (01/23/12)    Oral candidiasis    Pneumonia due to COVID-19 virus    Pyelonephritis    Review of Systems:  As per HPI  Physical Exam:  Vitals:   04/01/22 1106  BP: (!) 167/104  Pulse: (!) 104  Temp: 98.6 F (37 C)  TempSrc: Oral  SpO2: 99%  Weight: 251 lb (113.9 kg)   General: Resting comfortably in chair, no acute distress CV: Regular rate, rhythm. No murmurs appreciated. Warm extremities. Pulm: Normal work of breathing on room air. Clear to ausculation bilaterally. GI: Abdomen soft, non-tender, non-distended. Normoactive bowel sounds.  MSK: Normal bulk, tone. No peripheral edema noted. Skin: Warm, dry. No rashes or lesions. Neuro: Awake, alert, conversing appropriately. Grossly non-focal. Psych: Normal mood, affect, speech.   Assessment & Plan:   Hypertension associated with diabetes [facility]) Patient is returning to clinic to discuss blood pressure. Since last visit, she does report compliance with her medications ans has been taking irbesartan-hydrochlorothiazide daily. She has also kept a blood pressure log. Typically her systolic blood pressure ranges 120-130, diastolic 80-90's. As her pressures appear to be more normal at home, we will continue with her current regimen. I have  encouraged Tonya Simmons to continue checking her blood pressures at home and bring them to the clinic.  - Continue irbesartan-hydrochlorothiazide 300-12.5mg  daily - BMP today  Type 2 diabetes mellitus (HCC) During last visit, found to have A1c 8.8%. She does report compliance with her medications. She has had mild nausea and vomiting over the last two days, but she has been tolerating her medications well.  Tonya Simmons also brought a log of her sugars, which appear to all be at goal. She denies any hypoglycemic episodes.   Will plan to continue current regimen. We have obtained microalbumin/creatinine ratio today. She is to return in one month for an A1c re-check.  - Continue metformin 1000mg  twice daily - Victoza 0.6mg  daily - Repeat A1c in one month  Nausea & vomiting Ms. is presenting today for follow-up of her chronic medical conditions. During our discussion, she mentions she has had some mild nausea and vomiting over the last few days. She has not felt sick overall, just mild passing symptoms. Mentions just two episodes of non-bilious, non-bloody vomiting not associated with meals. Denies fevers, chills, abdominal pain, change in bowel movements. No major changes in diet recently.   I do not believe her medications are causing this issue right now. Her mild symptoms sound like a possible food poisoning vs viral illness, most likely a self-resolving issue. She appears well on exam, is eating and drinking, and without abdominal pain. I have encouraged Tonya Simmons to continue hydrating and return to clinic if symptoms persist or worsen over the next week.  - Supportive care -  Return to clinic if symptoms continue   Patient discussed with Dr. Andrey Spearman, MD Internal Medicine PGY-3 Pager: [phone]

## 2022-04-02 NOTE — Assessment & Plan Note (Addendum)
Tonya Simmons is presenting today for follow-up of her chronic medical conditions. During our discussion, she mentions she has had some mild nausea and vomiting over the last few days. She has not felt sick overall, just mild passing symptoms. Mentions just two episodes of non-bilious, non-bloody vomiting not associated with meals. Denies fevers, chills, abdominal pain, change in bowel movements. No major changes in diet recently.   I do not believe her medications are causing this issue right now. Her mild symptoms sound like a possible food poisoning vs viral illness, most likely a self-resolving issue. She appears well on exam, is eating and drinking, and without abdominal pain. I have encouraged [name] to continue hydrating and return to clinic if symptoms persist or worsen over the next week.  - Supportive care - Return to clinic if symptoms continue

## 2022-04-02 NOTE — Assessment & Plan Note (Addendum)
Patient is returning to clinic to discuss blood pressure. Since last visit, she does report compliance with her medications ans has been taking irbesartan-hydrochlorothiazide daily. She has also kept a blood pressure log. Typically her systolic blood pressure ranges 120-130, diastolic 80-90's. As her pressures appear to be more normal at home, we will continue with her current regimen. I have encouraged Tonya Simmons to continue checking her blood pressures at home and bring them to the clinic.  - Continue irbesartan-hydrochlorothiazide 300-12.5mg  daily - BMP today  ADDENDUM: I was informed by our lab that Tonya Simmons's blood hemolyzed. Please obtain BMP at next follow-up in one month.

## 2022-04-03 LAB — MICROALBUMIN / CREATININE URINE RATIO
Creatinine, Urine: 13 mg/dL
Microalb/Creat Ratio: 71 mg/g creat — ABNORMAL HIGH (ref 0–29)
Microalbumin, Urine: 9.2 ug/mL

## 2022-04-09 NOTE — Progress Notes (Signed)
Internal Medicine Clinic Attending ? ?Case discussed with Dr. Braswell  at the time of the visit.  We reviewed the resident?s history and exam and pertinent patient test results.  I agree with the assessment, diagnosis, and plan of care documented in the resident?s note.  ?

## 2022-04-29 ENCOUNTER — Encounter: Payer: Self-pay | Admitting: Dietician

## 2022-05-01 ENCOUNTER — Other Ambulatory Visit (HOSPITAL_COMMUNITY): Payer: Self-pay

## 2022-05-31 ENCOUNTER — Other Ambulatory Visit: Payer: Self-pay | Admitting: Student

## 2022-05-31 ENCOUNTER — Other Ambulatory Visit (HOSPITAL_COMMUNITY): Payer: Self-pay

## 2022-05-31 DIAGNOSIS — E119 Type 2 diabetes mellitus without complications: Secondary | ICD-10-CM

## 2022-06-01 MED ORDER — LIRAGLUTIDE 18 MG/3ML ~~LOC~~ SOPN
0.6000 mg | PEN_INJECTOR | Freq: Every day | SUBCUTANEOUS | 1 refills | Status: DC
  Filled 2022-06-01: qty 6, 60d supply, fill #0
  Filled 2022-07-02: qty 6, 30d supply, fill #1

## 2022-06-03 ENCOUNTER — Other Ambulatory Visit (HOSPITAL_COMMUNITY): Payer: Self-pay

## 2022-06-03 ENCOUNTER — Other Ambulatory Visit: Payer: Self-pay | Admitting: Internal Medicine

## 2022-06-03 DIAGNOSIS — E119 Type 2 diabetes mellitus without complications: Secondary | ICD-10-CM

## 2022-06-03 MED ORDER — UNIFINE PENTIPS 32G X 4 MM MISC
1.0000 | Freq: Every day | 1 refills | Status: AC
  Filled 2022-06-03: qty 100, 100d supply, fill #0

## 2022-07-02 ENCOUNTER — Other Ambulatory Visit (HOSPITAL_BASED_OUTPATIENT_CLINIC_OR_DEPARTMENT_OTHER): Payer: Self-pay

## 2022-07-02 ENCOUNTER — Other Ambulatory Visit (HOSPITAL_COMMUNITY): Payer: Self-pay

## 2022-08-02 ENCOUNTER — Other Ambulatory Visit (HOSPITAL_COMMUNITY): Payer: Self-pay

## 2022-09-06 ENCOUNTER — Other Ambulatory Visit (HOSPITAL_COMMUNITY): Payer: Self-pay

## 2022-09-06 ENCOUNTER — Other Ambulatory Visit: Payer: Self-pay | Admitting: Student

## 2022-09-06 DIAGNOSIS — E119 Type 2 diabetes mellitus without complications: Secondary | ICD-10-CM

## 2022-09-09 ENCOUNTER — Other Ambulatory Visit (HOSPITAL_COMMUNITY): Payer: Self-pay

## 2022-09-09 MED ORDER — METFORMIN HCL 500 MG PO TABS
1000.0000 mg | ORAL_TABLET | Freq: Two times a day (BID) | ORAL | 2 refills | Status: DC
  Filled 2022-09-09: qty 120, 30d supply, fill #0
  Filled 2022-10-07: qty 120, 30d supply, fill #1
  Filled 2022-11-07 (×2): qty 120, 30d supply, fill #2
  Filled 2022-12-11: qty 120, 30d supply, fill #3

## 2022-09-11 ENCOUNTER — Encounter: Payer: Self-pay | Admitting: Student

## 2022-09-11 ENCOUNTER — Ambulatory Visit: Payer: Self-pay | Admitting: Student

## 2022-09-11 ENCOUNTER — Other Ambulatory Visit: Payer: Self-pay

## 2022-09-11 VITALS — BP 133/86 | HR 117 | Temp 97.5°F | Ht 60.0 in | Wt 243.3 lb

## 2022-09-11 DIAGNOSIS — Z7984 Long term (current) use of oral hypoglycemic drugs: Secondary | ICD-10-CM

## 2022-09-11 DIAGNOSIS — Z0000 Encounter for general adult medical examination without abnormal findings: Secondary | ICD-10-CM

## 2022-09-11 DIAGNOSIS — E119 Type 2 diabetes mellitus without complications: Secondary | ICD-10-CM

## 2022-09-11 DIAGNOSIS — R000 Tachycardia, unspecified: Secondary | ICD-10-CM

## 2022-09-11 DIAGNOSIS — Z23 Encounter for immunization: Secondary | ICD-10-CM

## 2022-09-11 DIAGNOSIS — E1159 Type 2 diabetes mellitus with other circulatory complications: Secondary | ICD-10-CM

## 2022-09-11 DIAGNOSIS — I152 Hypertension secondary to endocrine disorders: Secondary | ICD-10-CM

## 2022-09-11 DIAGNOSIS — E1169 Type 2 diabetes mellitus with other specified complication: Secondary | ICD-10-CM

## 2022-09-11 DIAGNOSIS — Z6841 Body Mass Index (BMI) 40.0 and over, adult: Secondary | ICD-10-CM

## 2022-09-11 LAB — GLUCOSE, CAPILLARY: Glucose-Capillary: 177 mg/dL — ABNORMAL HIGH (ref 70–99)

## 2022-09-11 LAB — POCT GLYCOSYLATED HEMOGLOBIN (HGB A1C): Hemoglobin A1C: 9.4 % — AB (ref 4.0–5.6)

## 2022-09-11 MED ORDER — EMPAGLIFLOZIN 10 MG PO TABS: 10.0000 mg | ORAL_TABLET | Freq: Every day | ORAL | 0 refills | Status: DC

## 2022-09-11 MED ORDER — LIRAGLUTIDE 18 MG/3ML ~~LOC~~ SOPN: 1.2000 mg | PEN_INJECTOR | Freq: Every day | SUBCUTANEOUS | 1 refills | Status: DC

## 2022-09-11 NOTE — Patient Instructions (Addendum)
[name], me alegre de verle hoy!  He ordenado los siguientes laboratorios para usted:   Lab Orders         Glucose, capillary         BMP8+Anion Gap         POC Hbg A1C       Referencias ordenadas hoy:  Referral Orders  No referral(s) requested today     He ordenado el siguiente medicamento/cambiado los siguientes medicamentos:  Suspender los siguientes medicamentos: Medications Discontinued During This Encounter  Medication Reason   liraglutide (VICTOZA) 18 MG/3ML SOPN Reorder     Iniciar los siguientes medicamentos: Meds ordered this encounter  Medications   liraglutide (VICTOZA) 18 MG/3ML SOPN    Sig: Inject 1.2 mg into the skin daily.    Dispense:  9 mL    Refill:  1    IM PROGRAM   empagliflozin (JARDIANCE) 10 MG TABS tablet    Sig: Take 1 tablet (10 mg total) by mouth daily before breakfast.    Dispense:  90 tablet    Refill:  0     Seguimiento 3 months   Recordar:  -Aumente su inyeccin diaria contra la diabetes a 1,2 mg [employer]. He enviado una recarga para esto.  - Quiero que empieces a tomar Jardiance 10 mg. Esto es para su diabetes y para ayudar a proteger sus riones.  - Su frecuencia cardaca est un poco elevada, pero si comienza a desarrollar sntomas como dificultad para respirar o dolor en el pecho, regrese a la oficina. De lo contrario, nos veremos dentro de tres meses.  -Increase your daily diabetes shot to 1.'2mg'$  every day. I have sent in a refill for this.  - I want you to start taking Jardiance '10mg'$ . This is for your diabetes and to help protect your kidneys.   - Your heart rate is a bit elevated, but if you start developing symptoms like shortness of breath or chest pain, please come back to the office. Otherwise we will see you back in three months.   Asegrese de llegar 15 minutos antes de su prxima cita. Si llega tarde, es posible que se IT trainer.  Estamos deseando verle la proxima vez. [place] a [phone] si tiene preguntas o preocupaciones. El mejor tiempo para llamar es lunes a viernes, 9:00am - 4:00pm, pero hay alguien esta a su disposcion para lo que halga falta a cualquier hora. Si es fuera del horario de atencin o durante el fin de [place], llame al nmero principal del hospital y pregunte por el residente de guardia de medicina interna. SI necesita recambio de los [employer] , llama su farmacia una semana antes de fecha de finalizacion de la receta. La farmacia nos llamara para la solicitud.  Gracias que nos permite tomar parte en su asistencia medica. Deseamos lo mejor!  Debbe Odea, MD

## 2022-09-12 ENCOUNTER — Encounter: Payer: Self-pay | Admitting: Student

## 2022-09-12 DIAGNOSIS — R000 Tachycardia, unspecified: Secondary | ICD-10-CM | POA: Insufficient documentation

## 2022-09-12 LAB — BMP8+ANION GAP
Anion Gap: 18 mmol/L (ref 10.0–18.0)
BUN/Creatinine Ratio: 17 (ref 9–23)
BUN: 13 mg/dL (ref 6–24)
CO2: 18 mmol/L — ABNORMAL LOW (ref 20–29)
Calcium: 9.3 mg/dL (ref 8.7–10.2)
Chloride: 101 mmol/L (ref 96–106)
Creatinine, Ser: 0.75 mg/dL (ref 0.57–1.00)
Glucose: 169 mg/dL — ABNORMAL HIGH (ref 70–99)
Potassium: 3.9 mmol/L (ref 3.5–5.2)
Sodium: 137 mmol/L (ref 134–144)
eGFR: 103 mL/min/{1.73_m2} (ref >59)

## 2022-09-12 NOTE — Assessment & Plan Note (Signed)
Weight slightly down from previous visit, ~8lb. We will plan to increase Victoza today for improved glycemic effect and weight loss.  - Increase Victoza 1.'2mg'$  daily

## 2022-09-12 NOTE — Assessment & Plan Note (Addendum)
Patient reports compliance with her current regimen of Victoza and metformin, no adverse effects. She is currently asymptomatic, denying polyuria, polydipsia, vision changes, neuropathy. Today A1c more elevated at 9.4% from 8.8%.   Plan today to increase Victoza, as she is at the lowest dose currently. Will also plan to initiate SGLT2i today.  - Increase Victoza 1.'2mg'$  daily - Start Jardiance '10mg'$  daily - Continue metformin '1000mg'$  twice daily - Continue rosuvastatin '20mg'$  daily - Follow-up A1c in three months  ADDENDUM: Patient reports Jardiance too expensive, we will try alternative SGLT2i. Might need further assistance with our in house pharmacy tech.  - D/C Jardiance, start Invokana

## 2022-09-12 NOTE — Progress Notes (Signed)
   CC: follow-up  HPI:  Tonya Simmons is a 42 y.o. person with medical history as below presenting to [facility] for follow-up.  Please see problem-based list for further details, assessments, and plans.  Past Medical History:  Diagnosis Date   Diabetes mellitus without complication (HCC)    Hepatic steatosis    History of deep vein thrombosis (DVT) of lower extremity    Hypertension    Hypertension in pregnancy, antepartum 12/11/2011   Borderline dx: no antenatal testing at this time (01/23/12)    Review of Systems:  As per HPI  Physical Exam:  Vitals:   09/11/22 1451 09/11/22 1501  BP: (!) 126/91 133/86  Pulse: (!) 120 (!) 117  Temp: (!) 97.5 F (36.4 C)   TempSrc: Oral   SpO2: 99%   Weight: 243 lb 4.8 oz (110.4 kg)   Height: 5' (1.524 m)    General: Resting comfortably in no acute distress CV: Regular rate, rhythm. No murmurs appreciated. Warm extremities.  Pulm: Normal work of breathing on room air. Clear to auscultation bilaterally.  MSK: Normal bulk, tone. No peripheral edema noted. Skin: Warm, dry. No rashes or lesions appreciated.  Neuro: Awake, alert, conversing appropriately. Grossly non-focal.  Psych: Normal mood, affect, speech.   Assessment & Plan:   Type 2 diabetes mellitus ([place]) Patient reports compliance with her current regimen of Victoza and metformin, no adverse effects. She is currently asymptomatic, denying polyuria, polydipsia, vision changes, neuropathy. Today A1c more elevated at 9.4% from 8.8%.   Plan today to increase Victoza, as she is at the lowest dose currently. Will also plan to initiate SGLT2i today.  - Increase Victoza 1.67m daily - Start Jardiance 111mdaily - Continue metformin 100081mwice daily - Continue rosuvastatin 56m34mily - Follow-up A1c in three months  Hypertension associated with diabetes (HCC)[place] Readings from Last 3 Encounters:  09/11/22 133/86  04/01/22 (!) 167/104  01/29/22 (!) 152/105   Blood  pressure at goal during today's visit. She is asymptomatic, tolerating her medication well. Will continue with her current regimen, obtain BMP.   - Irbesartan-hydrochlorothiazide 300-12.5mg 16mly - BMP today  Obesity Weight slightly down from previous visit, ~8lb. We will plan to increase Victoza today for improved glycemic effect and weight loss.  - Increase Victoza 1.2mg d48my  Healthcare maintenance - Influenza vaccine given today  Tachycardia Patient has remained tachycardic during most of her visits to our clinic. Today it is elevated to 110's. She is asymptomatic, denies chest pains, dyspnea, palpitations, lightheadedness. Previously have checked TSH, normal. Previous ECG's with patient in normal sinus rhythm. Hopeful with continued weight loss HR will also improve.   - Continue to monitor   Patient discussed with Dr. GuilloKathrynn Duckingnternal Medicine PGY-3 Pager: [phone]

## 2022-09-12 NOTE — Assessment & Plan Note (Signed)
Patient has remained tachycardic during most of her visits to our clinic. Today it is elevated to 110's. She is asymptomatic, denies chest pains, dyspnea, palpitations, lightheadedness. Previously have checked TSH, normal. Previous ECG's with patient in normal sinus rhythm. Hopeful with continued weight loss HR will also improve.   - Continue to monitor

## 2022-09-12 NOTE — Assessment & Plan Note (Signed)
BP Readings from Last 3 Encounters:  09/11/22 133/86  04/01/22 (!) 167/104  01/29/22 (!) 152/105   Blood pressure at goal during today's visit. She is asymptomatic, tolerating her medication well. Will continue with her current regimen, obtain BMP.   - Irbesartan-hydrochlorothiazide 300-12.'5mg'$  daily - BMP today

## 2022-09-12 NOTE — Assessment & Plan Note (Signed)
-   Influenza vaccine given today

## 2022-09-16 MED ORDER — CANAGLIFLOZIN 100 MG PO TABS: 100.0000 mg | ORAL_TABLET | Freq: Every day | ORAL | 0 refills | Status: DC

## 2022-09-16 NOTE — Addendum Note (Signed)
Addended bySanjuan Dame on: 09/16/2022 02:31 PM   Modules accepted: Orders

## 2022-09-16 NOTE — Progress Notes (Signed)
Internal Medicine Clinic Attending ? ?Case discussed with Dr. Braswell  At the time of the visit.  We reviewed the resident?s history and exam and pertinent patient test results.  I agree with the assessment, diagnosis, and plan of care documented in the resident?s note.  ?

## 2022-10-07 ENCOUNTER — Other Ambulatory Visit (HOSPITAL_COMMUNITY): Payer: Self-pay

## 2022-11-07 ENCOUNTER — Other Ambulatory Visit (HOSPITAL_COMMUNITY): Payer: Self-pay

## 2022-11-07 ENCOUNTER — Other Ambulatory Visit: Payer: Self-pay | Admitting: Student

## 2022-11-07 DIAGNOSIS — I152 Hypertension secondary to endocrine disorders: Secondary | ICD-10-CM

## 2022-11-08 ENCOUNTER — Other Ambulatory Visit (HOSPITAL_COMMUNITY): Payer: Self-pay

## 2022-11-08 MED ORDER — IRBESARTAN-HYDROCHLOROTHIAZIDE 300-12.5 MG PO TABS
1.0000 | ORAL_TABLET | Freq: Every day | ORAL | 2 refills | Status: DC
  Filled 2022-11-08: qty 30, 30d supply, fill #0
  Filled 2022-12-11: qty 30, 30d supply, fill #1
  Filled 2023-01-13: qty 30, 30d supply, fill #2

## 2022-12-11 ENCOUNTER — Other Ambulatory Visit (HOSPITAL_COMMUNITY): Payer: Self-pay

## 2022-12-17 ENCOUNTER — Encounter: Payer: Self-pay | Admitting: *Deleted

## 2023-01-13 ENCOUNTER — Other Ambulatory Visit: Payer: Self-pay | Admitting: Student

## 2023-01-13 ENCOUNTER — Other Ambulatory Visit (HOSPITAL_COMMUNITY): Payer: Self-pay

## 2023-01-13 DIAGNOSIS — E119 Type 2 diabetes mellitus without complications: Secondary | ICD-10-CM

## 2023-01-14 ENCOUNTER — Other Ambulatory Visit (HOSPITAL_COMMUNITY): Payer: Self-pay

## 2023-01-14 MED ORDER — METFORMIN HCL 500 MG PO TABS
1000.0000 mg | ORAL_TABLET | Freq: Two times a day (BID) | ORAL | 0 refills | Status: DC
Start: 2023-01-14 — End: 2023-02-05
  Filled 2023-01-14: qty 120, 30d supply, fill #0

## 2023-01-27 ENCOUNTER — Other Ambulatory Visit (HOSPITAL_COMMUNITY): Payer: Self-pay

## 2023-02-05 ENCOUNTER — Other Ambulatory Visit (HOSPITAL_COMMUNITY): Payer: Self-pay

## 2023-02-05 ENCOUNTER — Ambulatory Visit (INDEPENDENT_AMBULATORY_CARE_PROVIDER_SITE_OTHER): Payer: Self-pay | Admitting: Student

## 2023-02-05 VITALS — BP 156/120 | HR 70 | Temp 98.5°F | Ht 60.0 in | Wt 244.5 lb

## 2023-02-05 DIAGNOSIS — E119 Type 2 diabetes mellitus without complications: Secondary | ICD-10-CM

## 2023-02-05 DIAGNOSIS — I152 Hypertension secondary to endocrine disorders: Secondary | ICD-10-CM

## 2023-02-05 DIAGNOSIS — E782 Mixed hyperlipidemia: Secondary | ICD-10-CM

## 2023-02-05 DIAGNOSIS — E1169 Type 2 diabetes mellitus with other specified complication: Secondary | ICD-10-CM

## 2023-02-05 DIAGNOSIS — E1159 Type 2 diabetes mellitus with other circulatory complications: Secondary | ICD-10-CM

## 2023-02-05 DIAGNOSIS — E785 Hyperlipidemia, unspecified: Secondary | ICD-10-CM

## 2023-02-05 LAB — POCT GLYCOSYLATED HEMOGLOBIN (HGB A1C): Hemoglobin A1C: 9.6 % — AB (ref 4.0–5.6)

## 2023-02-05 LAB — GLUCOSE, CAPILLARY: Glucose-Capillary: 213 mg/dL — ABNORMAL HIGH (ref 70–99)

## 2023-02-05 MED ORDER — IRBESARTAN-HYDROCHLOROTHIAZIDE 300-12.5 MG PO TABS
1.0000 | ORAL_TABLET | Freq: Every day | ORAL | 3 refills | Status: DC
Start: 2023-02-05 — End: 2023-10-21
  Filled 2023-02-05: qty 30, 30d supply, fill #0
  Filled 2023-03-10: qty 30, 30d supply, fill #1
  Filled 2023-04-10: qty 30, 30d supply, fill #2
  Filled 2023-05-09: qty 30, 30d supply, fill #3
  Filled 2023-06-09: qty 30, 30d supply, fill #4
  Filled 2023-07-17: qty 30, 30d supply, fill #5
  Filled 2023-08-18: qty 30, 30d supply, fill #6
  Filled 2023-09-16: qty 30, 30d supply, fill #7

## 2023-02-05 MED ORDER — METFORMIN HCL 500 MG PO TABS
500.0000 mg | ORAL_TABLET | Freq: Two times a day (BID) | ORAL | 3 refills | Status: DC
Start: 2023-02-05 — End: 2023-05-29
  Filled 2023-02-05: qty 60, 30d supply, fill #0
  Filled 2023-03-10: qty 60, 30d supply, fill #1
  Filled 2023-04-10: qty 60, 30d supply, fill #2
  Filled 2023-05-09: qty 60, 30d supply, fill #3

## 2023-02-05 MED ORDER — EMPAGLIFLOZIN 10 MG PO TABS
10.0000 mg | ORAL_TABLET | Freq: Every day | ORAL | 3 refills | Status: DC
  Filled 2023-02-05: qty 30, 30d supply, fill #0
  Filled 2023-03-10: qty 30, 30d supply, fill #1
  Filled 2023-04-10 (×2): qty 30, 30d supply, fill #2
  Filled 2023-05-09: qty 30, 30d supply, fill #3

## 2023-02-05 MED ORDER — ROSUVASTATIN CALCIUM 20 MG PO TABS
20.0000 mg | ORAL_TABLET | Freq: Every day | ORAL | 3 refills | Status: DC
Start: 2023-02-05 — End: 2023-10-21
  Filled 2023-02-05: qty 30, 30d supply, fill #0
  Filled 2023-03-10: qty 30, 30d supply, fill #1
  Filled 2023-04-10: qty 30, 30d supply, fill #2
  Filled 2023-05-09: qty 30, 30d supply, fill #3
  Filled 2023-06-09: qty 30, 30d supply, fill #4
  Filled 2023-07-17: qty 30, 30d supply, fill #5
  Filled 2023-08-18: qty 30, 30d supply, fill #6
  Filled 2023-09-16: qty 30, 30d supply, fill #7

## 2023-02-05 NOTE — Assessment & Plan Note (Signed)
History of hypertension currently being managed with Avalide.  Blood pressure in the office today was 156/120.  Patient does report not being able to pick up his prescriptions for the last month and had run out of her medications her medications.  She was somewhat confused because part of her medications was sent to [facility] and at this sent to [facility] community pharmacy, most likely in the setting of language barrier. -Represcribed patient's medication to [facility] ED pharmacy through the IM program.  Teach back was used to ensure patient understood where to pick up her medication and also what dose she is on

## 2023-02-05 NOTE — Assessment & Plan Note (Signed)
History of hyperlipidemia currently on Crestor 30 mg, last LDL was 230, denies any chest pain or shortness of breath at this time.  Continue on Crestor 20 mg

## 2023-02-05 NOTE — Patient Instructions (Signed)
Thank you, [name] for allowing [place] to provide your care today. Today we discussed .    I have ordered the following labs for you:  Lab Orders         Glucose, capillary         POC Hbg A1C      Tests ordered today:    Referrals ordered today:   Referral Orders  No referral(s) requested today     I have ordered the following medication/changed the following medications:   Stop the following medications: Medications Discontinued During This Encounter  Medication Reason   canagliflozin (INVOKANA) 100 MG TABS tablet Discontinued by provider   rosuvastatin (CRESTOR) 20 MG tablet Reorder   irbesartan-hydrochlorothiazide (AVALIDE) 300-12.5 MG tablet Reorder   metFORMIN (GLUCOPHAGE) 500 MG tablet Reorder     Start the following medications: Meds ordered this encounter  Medications   rosuvastatin (CRESTOR) 20 MG tablet    Sig: Take 1 tablet (20 mg total) by mouth daily.    Dispense:  90 tablet    Refill:  3    IM PROGRAM   irbesartan-hydrochlorothiazide (AVALIDE) 300-12.5 MG tablet    Sig: Take 1 tablet by mouth daily.    Dispense:  90 tablet    Refill:  3    IM program   empagliflozin (JARDIANCE) 10 MG TABS tablet    Sig: Take 1 tablet (10 mg total) by mouth daily before breakfast.    Dispense:  90 tablet    Refill:  3   metFORMIN (GLUCOPHAGE) 500 MG tablet    Sig: Take 1 tablet (500 mg total) by mouth 2 (two) times daily with a meal.    Dispense:  90 tablet    Refill:  3    IM PROGRAM     Follow up: 6 months   Remember:   Should you have any questions or concerns please call the internal medicine clinic at [phone].    Kathleen Lime, M.D [facility]

## 2023-02-05 NOTE — Progress Notes (Signed)
CC: A1c follow-up and medication refill  HPI:  Tonya Simmons is a 42 y.o. female living with a history stated below and presents today for A1c follow-up. Please see problem based assessment and plan for additional details.  Past Medical History:  Diagnosis Date   Diabetes mellitus without complication (HCC)    Hepatic steatosis    History of deep vein thrombosis (DVT) of lower extremity    Hypertension    Hypertension in pregnancy, antepartum 12/11/2011   Borderline dx: no antenatal testing at this time (01/23/12)     Current Outpatient Medications on File Prior to Visit  Medication Sig Dispense Refill   Insulin Pen Needle (UNIFINE PENTIPS) 32G X 4 MM MISC Use to inject victoza daily 400 each 1   liraglutide (VICTOZA) 18 MG/3ML SOPN Inject 1.2 mg into the skin daily. 9 mL 1   No current facility-administered medications on file prior to visit.    Family History  Problem Relation Age of Onset   Hypertension Father    Stroke Father    Osteoporosis Mother     Social History   Socioeconomic History   Marital status: Significant Other    Spouse name: Not on file   Number of children: Not on file   Years of education: Not on file   Highest education level: Not on file  Occupational History   Not on file  Tobacco Use   Smoking status: Never   Smokeless tobacco: Never  Substance and Sexual Activity   Alcohol use: No   Drug use: No   Sexual activity: Yes    Birth control/protection: None  Other Topics Concern   Not on file  Social History Narrative   Not on file   Social Determinants of Health   Financial Resource Strain: Low Risk  (09/11/2022)   Overall Financial Resource Strain (CARDIA)    Difficulty of Paying Living Expenses: Not hard at all  Food Insecurity: No Food Insecurity (09/11/2022)   Hunger Vital Sign    Worried About Running Out of Food in the Last Year: Never true    Ran Out of Food in the Last Year: Never true  Transportation Needs:  No Transportation Needs (09/11/2022)   PRAPARE - Transportation    Lack of Transportation (Medical): No    Lack of Transportation (Non-Medical): No  Physical Activity: Insufficiently Active (09/11/2022)   Exercise Vital Sign    Days of Exercise per Week: 3 days    Minutes of Exercise per Session: 30 min  Stress: No Stress Concern Present (09/11/2022)   [employer] of Occupational Health - Occupational Stress Questionnaire    Feeling of Stress : Not at all  Social Connections: Moderately Integrated (09/11/2022)   Social Connection and Isolation Panel [NHANES]    Frequency of Communication with Friends and Family: More than three times a week    Frequency of Social Gatherings with Friends and Family: More than three times a week    Attends Religious Services: More than 4 times per year    Active Member of [employer] or Organizations: No    Attends Banker Meetings: Never    Marital Status: Married  Catering manager Violence: Not At Risk (09/11/2022)   Humiliation, Afraid, Rape, and Kick questionnaire    Fear of Current or Ex-Partner: No    Emotionally Abused: No    Physically Abused: No    Sexually Abused: No    Review of Systems: ROS negative except for what is noted on the  assessment and plan.  Vitals:   02/05/23 1511 02/05/23 1559  BP: (!) 152/116 (!) 156/120  Pulse: (!) 114 70  Temp: 98.5 F (36.9 C)   TempSrc: Oral   SpO2: 99%   Weight: 244 lb 8 oz (110.9 kg)   Height: 5' (1.524 m)     Physical Exam: Constitutional: Obese looking woman sitting in the chair comfortably Spanish-speaking Neurological: alert & oriented x 3, no focal deficit Skin: Normal skin pigmentation no rash    Assessment & Plan:   Hypertension associated with diabetes (HCC) History of hypertension currently being managed with Avalide.  Blood pressure in the office today was 156/120.  Patient does report not being able to pick up his prescriptions for the last month and had run out of  her medications her medications.  She was somewhat confused because part of her medications was sent to [facility] and at this sent to [facility] community pharmacy, most likely in the setting of language barrier. -Represcribed patient's medication to [facility] ED pharmacy through the IM program.  Teach back was used to ensure patient understood where to pick up her medication and also what dose she is on  Type 2 diabetes mellitus (HCC) History of type 2 diabetes currently on metformin and Victoza, however patient is uninsured and does not have a payer source, so she was unable to pick up her Victoza because it was too expensive.  She was therefore not taking any medication for her diabetes for the past couple of months. Per chart review her metformin was never dispensed.  Once again this is most likely due to language barrier.  Teach back was used to ensure the patient understands where to pick up her medications from and what dose they are on.  Because patient does not have a payer source, Victoza does not seem feasible for her at this time.  I will consider switching the patient to metformin and empagliflozin 10 mg , since she has not been on metformin for quite some time and would like to resume the metformin dosing protocol -Prescribe metformin 500 mg once daily for 1 week, and switch to 500 twice daily the second week, and switch to 500 mg in the morning and 1000 mg at night in the third week, and then switch to 1000 mg twice daily moving forward in the fourth week -Prescribe empagliflozin 10 mg  Hyperlipidemia associated with type 2 diabetes mellitus (HCC) History of hyperlipidemia currently on Crestor 30 mg, last LDL was 230, denies any chest pain or shortness of breath at this time.  Continue on Crestor 20 mg   Patient seen with Dr. Lavonna Monarch, M.D [facility] Internal [facility] Phone: [phone] Date 02/05/2023 Time 6:32 PM

## 2023-02-05 NOTE — Assessment & Plan Note (Signed)
History of type 2 diabetes currently on metformin and Victoza, however patient is uninsured and does not have a payer source, so she was unable to pick up her Victoza because it was too expensive.  She was therefore not taking any medication for her diabetes for the past couple of months. Per chart review her metformin was never dispensed.  Once again this is most likely due to language barrier.  Teach back was used to ensure the patient understands where to pick up her medications from and what dose they are on.  Because patient does not have a payer source, Victoza does not seem feasible for her at this time.  I will consider switching the patient to metformin and empagliflozin 10 mg , since she has not been on metformin for quite some time and would like to resume the metformin dosing protocol -Prescribe metformin 500 mg once daily for 1 week, and switch to 500 twice daily the second week, and switch to 500 mg in the morning and 1000 mg at night in the third week, and then switch to 1000 mg twice daily moving forward in the fourth week -Prescribe empagliflozin 10 mg

## 2023-02-06 ENCOUNTER — Other Ambulatory Visit (HOSPITAL_COMMUNITY): Payer: Self-pay

## 2023-02-27 NOTE — Progress Notes (Signed)
Internal Medicine Clinic Attending  I was physically present during the key portions of the resident provided service and participated in the medical decision making of patient's management care. I reviewed pertinent patient test results.  The assessment, diagnosis, and plan were formulated together and I agree with the documentation in the resident's note.  Williams, Julie Anne, MD  

## 2023-03-10 ENCOUNTER — Other Ambulatory Visit (HOSPITAL_COMMUNITY): Payer: Self-pay

## 2023-03-13 ENCOUNTER — Other Ambulatory Visit (HOSPITAL_COMMUNITY): Payer: Self-pay

## 2023-03-20 ENCOUNTER — Telehealth: Payer: Self-pay

## 2023-03-20 NOTE — Telephone Encounter (Signed)
Please initiate PAP for patient.   Switching from IM program.

## 2023-03-28 NOTE — Telephone Encounter (Signed)
Mailed [employer] application to patients home for [place] assistance.

## 2023-04-10 ENCOUNTER — Other Ambulatory Visit (HOSPITAL_COMMUNITY): Payer: Self-pay

## 2023-04-16 NOTE — Telephone Encounter (Signed)
Rec'd pt's pages. Provider pages in red teams box.

## 2023-04-22 NOTE — Telephone Encounter (Signed)
Submitted application for [employer] to [employer]) for patient assistance.   Phone: [phone]

## 2023-05-09 ENCOUNTER — Other Ambulatory Visit (HOSPITAL_COMMUNITY): Payer: Self-pay

## 2023-05-29 ENCOUNTER — Other Ambulatory Visit: Payer: Self-pay

## 2023-05-29 ENCOUNTER — Ambulatory Visit (INDEPENDENT_AMBULATORY_CARE_PROVIDER_SITE_OTHER): Payer: Self-pay | Admitting: Student

## 2023-05-29 ENCOUNTER — Encounter: Payer: Self-pay | Admitting: Student

## 2023-05-29 ENCOUNTER — Other Ambulatory Visit (HOSPITAL_COMMUNITY): Payer: Self-pay

## 2023-05-29 VITALS — BP 138/101 | HR 112 | Temp 99.1°F | Ht 66.0 in | Wt 239.8 lb

## 2023-05-29 DIAGNOSIS — I152 Hypertension secondary to endocrine disorders: Secondary | ICD-10-CM

## 2023-05-29 DIAGNOSIS — E785 Hyperlipidemia, unspecified: Secondary | ICD-10-CM

## 2023-05-29 DIAGNOSIS — E1159 Type 2 diabetes mellitus with other circulatory complications: Secondary | ICD-10-CM

## 2023-05-29 DIAGNOSIS — Z7984 Long term (current) use of oral hypoglycemic drugs: Secondary | ICD-10-CM

## 2023-05-29 DIAGNOSIS — E1169 Type 2 diabetes mellitus with other specified complication: Secondary | ICD-10-CM

## 2023-05-29 LAB — POCT GLYCOSYLATED HEMOGLOBIN (HGB A1C): Hemoglobin A1C: 8.3 % — AB (ref 4.0–5.6)

## 2023-05-29 LAB — GLUCOSE, CAPILLARY: Glucose-Capillary: 145 mg/dL — ABNORMAL HIGH (ref 70–99)

## 2023-05-29 MED ORDER — METFORMIN HCL 1000 MG PO TABS
1000.0000 mg | ORAL_TABLET | Freq: Two times a day (BID) | ORAL | 11 refills | Status: DC
Start: 2023-05-29 — End: 2023-10-21
  Filled 2023-05-29: qty 60, 30d supply, fill #0
  Filled 2023-07-17: qty 60, 30d supply, fill #1
  Filled 2023-08-18: qty 60, 30d supply, fill #2
  Filled 2023-09-16: qty 60, 30d supply, fill #3

## 2023-05-29 NOTE — Assessment & Plan Note (Signed)
Patiently is currently on Crestor 20 mg daily endorses good adherence.  Her last lipid panel was collected 2 years ago.  Of note, the patient is uninsured so she was provided orange card paperwork.  After completion of monitor paperwork, we will follow-up with lipid panel. - Continue Crestor 20 mg daily Currently on Crestor 20 mg. Last lipid panel collected two years ago. Notes good adherence  - F/U lipid panel after orange card paperwork

## 2023-05-29 NOTE — Patient Instructions (Addendum)
Thank you so much for coming to the clinic today!   I have sent in a prescription for the new dose of your metformin.   For your new prescription metformin, please start taking 1000 mg twice per day. For the new bottle to pick up today, this will be one tablet in the morning and one tablet in the evening.   We will see you again in three months.   If you have any questions please feel free to the call the clinic at anytime at [phone]. It was a pleasure seeing you!  Best, Dr. Rayvon Char   ------------------------------------------------------ Muchas gracias por venir a la clnica hoy!  He enviado una receta para la nueva dosis de su metformina.  Para su nueva receta de metformina, comience a tomar 1000 mg dos veces al da. Para el nuevo frasco que debe retirar [employer], ser una tableta por la maana y una tableta por la noche.  Nos veremos nuevamente en tres meses.  Si tiene [employer], no dude en llamar a la clnica en cualquier momento al [phone]. Fue Psychiatrist verlo!  Atentamente, Dr. Rayvon Char

## 2023-05-29 NOTE — Progress Notes (Signed)
CC: Diabetes follow-up  HPI: Tonya Simmons is a 42 y.o. female living with a history stated below and presents today for diabetes follow-up. Please see problem based assessment and plan for additional details.  Past Medical History:  Diagnosis Date   Diabetes mellitus without complication (HCC)    Hepatic steatosis    History of deep vein thrombosis (DVT) of lower extremity    Hypertension    Hypertension in pregnancy, antepartum 12/11/2011   Borderline dx: no antenatal testing at this time (01/23/12)     Current Outpatient Medications on File Prior to Visit  Medication Sig Dispense Refill   empagliflozin (JARDIANCE) 10 MG TABS tablet Take 1 tablet (10 mg total) by mouth daily before breakfast. 90 tablet 3   Insulin Pen Needle (UNIFINE PENTIPS) 32G X 4 MM MISC Use to inject victoza daily 400 each 1   irbesartan-hydrochlorothiazide (AVALIDE) 300-12.5 MG tablet Take 1 tablet by mouth daily. 90 tablet 3   liraglutide (VICTOZA) 18 MG/3ML SOPN Inject 1.2 mg into the skin daily. 9 mL 1   rosuvastatin (CRESTOR) 20 MG tablet Take 1 tablet (20 mg total) by mouth daily. 90 tablet 3   No current facility-administered medications on file prior to visit.    Family History  Problem Relation Age of Onset   Hypertension Father    Stroke Father    Osteoporosis Mother     Social History   Socioeconomic History   Marital status: Married    Spouse name: Not on file   Number of children: Not on file   Years of education: Not on file   Highest education level: Not on file  Occupational History   Not on file  Tobacco Use   Smoking status: Never   Smokeless tobacco: Never  Substance and Sexual Activity   Alcohol use: No   Drug use: No   Sexual activity: Yes    Birth control/protection: None  Other Topics Concern   Not on file  Social History Narrative   Not on file   Social Determinants of Health   Financial Resource Strain: Low Risk  (09/11/2022)   Overall Financial  Resource Strain (CARDIA)    Difficulty of Paying Living Expenses: Not hard at all  Food Insecurity: No Food Insecurity (09/11/2022)   Hunger Vital Sign    Worried About Running Out of Food in the Last Year: Never true    Ran Out of Food in the Last Year: Never true  Transportation Needs: No Transportation Needs (09/11/2022)   PRAPARE - Transportation    Lack of Transportation (Medical): No    Lack of Transportation (Non-Medical): No  Physical Activity: Insufficiently Active (09/11/2022)   Exercise Vital Sign    Days of Exercise per Week: 3 days    Minutes of Exercise per Session: 30 min  Stress: No Stress Concern Present (09/11/2022)   [employer] of Occupational Health - Occupational Stress Questionnaire    Feeling of Stress : Not at all  Social Connections: Moderately Integrated (09/11/2022)   Social Connection and Isolation Panel [NHANES]    Frequency of Communication with Friends and Family: More than three times a week    Frequency of Social Gatherings with Friends and Family: More than three times a week    Attends Religious Services: More than 4 times per year    Active Member of [employer] or Organizations: No    Attends Banker Meetings: Never    Marital Status: Married  Catering manager Violence: Not At  Risk (09/11/2022)   Humiliation, Afraid, Rape, and Kick questionnaire    Fear of Current or Ex-Partner: No    Emotionally Abused: No    Physically Abused: No    Sexually Abused: No    Review of Systems: ROS negative except for what is noted on the assessment and plan.  Vitals:   05/29/23 1503 05/29/23 1509  BP: (!) 145/97 (!) 138/101  Pulse: (!) 113 (!) 112  Temp: 99.1 F (37.3 C)   TempSrc: Oral   SpO2: 100%   Weight: 239 lb 12.8 oz (108.8 kg)   Height: 5\' 6"  (1.676 m)     Physical Exam: Constitutional: well-appearing in no acute distress HENT: normocephalic atraumatic, mucous membranes moist Eyes: conjunctiva non-erythematous Neck:  supple Cardiovascular: Tachycardic with regular rhythm, no m/r/g Pulmonary/Chest: normal work of breathing on room air, lungs clear to auscultation bilaterally Abdominal: soft, non-tender, non-distended MSK: normal bulk and tone Neurological: alert & oriented x 3, 5/5 strength in bilateral upper and lower extremities, normal gait Skin: warm and dry  Assessment & Plan:   Hypertension associated with diabetes (HCC) Patient's last A1c was 9.6 in July and today it is 8.3.  At her last visit, patient was to be titrated to 1000 mg of metformin twice daily but there seems to have been some confusion regarding this titration.  Patient has only been taking 500 mg of metformin twice daily with Jardiance 10 mg daily.  We will start patient on 1000 mg of metformin twice daily and follow-up in 3 months.  Furthermore, patient noted to have been prescribed Victoza in the past but was unable to pick up due to price.  Will follow-up regarding this for further control patient's glucose. - Continue Jardiance 10 mg daily - Begin metformin 1000 mg twice daily - Follow-up Victoza prescription - Follow-up microalbumin  Hyperlipidemia associated with type 2 diabetes mellitus (HCC) Patiently is currently on Crestor 20 mg daily endorses good adherence.  Her last lipid panel was collected 2 years ago.  Of note, the patient is uninsured so she was provided orange card paperwork.  After completion of monitor paperwork, we will follow-up with lipid panel. - Continue Crestor 20 mg daily Currently on Crestor 20 mg. Last lipid panel collected two years ago. Notes good adherence  - F/U lipid panel after orange card paperwork    Patient seen with Dr. Collier Flowers, MD  [facility] Internal Medicine, PGY-1 Date 05/29/2023 Time 3:57 PM

## 2023-05-29 NOTE — Assessment & Plan Note (Signed)
Patient's last A1c was 9.6 in July and today it is 8.3.  At her last visit, patient was to be titrated to 1000 mg of metformin twice daily but there seems to have been some confusion regarding this titration.  Patient has only been taking 500 mg of metformin twice daily with Jardiance 10 mg daily.  We will start patient on 1000 mg of metformin twice daily and follow-up in 3 months.  Furthermore, patient noted to have been prescribed Victoza in the past but was unable to pick up due to price.  Will follow-up regarding this for further control patient's glucose. - Continue Jardiance 10 mg daily - Begin metformin 1000 mg twice daily - Follow-up Victoza prescription - Follow-up microalbumin

## 2023-05-30 LAB — MICROALBUMIN / CREATININE URINE RATIO
Creatinine, Urine: 27.7 mg/dL
Microalb/Creat Ratio: 62 mg/g{creat} — ABNORMAL HIGH (ref 0–29)
Microalbumin, Urine: 17.3 ug/mL

## 2023-06-02 NOTE — Progress Notes (Signed)
Internal Medicine Clinic Attending  I was physically present during the key portions of the resident provided service and participated in the medical decision making of patient's management care. I reviewed pertinent patient test results.  The assessment, diagnosis, and plan were formulated together and I agree with the documentation in the resident's note.  Gust Rung, DO

## 2023-06-03 NOTE — Progress Notes (Signed)
Sent patient MyChart message to discuss results. Microalbumin elevated. Will begin metformin BID which will help and will continue to monitor.

## 2023-06-09 ENCOUNTER — Other Ambulatory Visit (HOSPITAL_COMMUNITY): Payer: Self-pay

## 2023-06-09 NOTE — Progress Notes (Signed)
Pharmacy Medication Assistance Program Note    06/09/2023  Patient ID: Tonya Simmons, female   DOB: 10-06-1980, 42 y.o.   MRN: [id number]     06/09/2023  Outreach Medication One  Initial Outreach Date (Medication One) 03/20/2023  Manufacturer Medication One [employer]  [employer] Drugs Jardiance  Type of Assistance Manufacturer Assistance  Date Application Sent to Patient 03/28/2023  Application Items Requested Application  Date Application Sent to Prescriber 04/16/2023  Name of Prescriber rayann atway  Date Application Received From Patient 04/16/2023  Application Items Received From Patient Application  Date Application Submitted to Manufacturer 04/22/2023  Method Application Sent to Manufacturer Fax  Patient Assistance Determination Approved  Approval Start Date 05/24/2023  Approval End Date 05/14/2024

## 2023-06-09 NOTE — Telephone Encounter (Signed)
Received notification from [employer]) regarding approval for [employer]. Patient assistance approved from 05/24/23 to 05/14/24.  Medication will ship to PATIENTS HOME.  Pt ID: [id number]  Company phone: [phone]  Refills should be automatic from [facility] pharmacy.

## 2023-07-17 ENCOUNTER — Other Ambulatory Visit (HOSPITAL_COMMUNITY): Payer: Self-pay

## 2023-08-18 ENCOUNTER — Other Ambulatory Visit (HOSPITAL_COMMUNITY): Payer: Self-pay

## 2023-08-19 ENCOUNTER — Other Ambulatory Visit: Payer: Self-pay | Admitting: Internal Medicine

## 2023-08-20 NOTE — Telephone Encounter (Signed)
 Medication sent to pharmacy

## 2023-09-16 ENCOUNTER — Other Ambulatory Visit (HOSPITAL_COMMUNITY): Payer: Self-pay

## 2023-10-21 ENCOUNTER — Telehealth: Payer: Self-pay | Admitting: *Deleted

## 2023-10-21 ENCOUNTER — Other Ambulatory Visit (HOSPITAL_COMMUNITY): Payer: Self-pay

## 2023-10-21 ENCOUNTER — Ambulatory Visit: Payer: Self-pay | Admitting: Student

## 2023-10-21 VITALS — BP 151/101 | HR 107 | Temp 98.9°F | Ht 66.0 in | Wt 247.4 lb

## 2023-10-21 DIAGNOSIS — E1159 Type 2 diabetes mellitus with other circulatory complications: Secondary | ICD-10-CM

## 2023-10-21 DIAGNOSIS — Z7985 Long-term (current) use of injectable non-insulin antidiabetic drugs: Secondary | ICD-10-CM

## 2023-10-21 DIAGNOSIS — E119 Type 2 diabetes mellitus without complications: Secondary | ICD-10-CM

## 2023-10-21 DIAGNOSIS — Z7984 Long term (current) use of oral hypoglycemic drugs: Secondary | ICD-10-CM

## 2023-10-21 DIAGNOSIS — E1169 Type 2 diabetes mellitus with other specified complication: Secondary | ICD-10-CM

## 2023-10-21 DIAGNOSIS — I152 Hypertension secondary to endocrine disorders: Secondary | ICD-10-CM

## 2023-10-21 DIAGNOSIS — E782 Mixed hyperlipidemia: Secondary | ICD-10-CM

## 2023-10-21 LAB — POCT GLYCOSYLATED HEMOGLOBIN (HGB A1C): Hemoglobin A1C: 8.8 % — AB (ref 4.0–5.6)

## 2023-10-21 LAB — GLUCOSE, CAPILLARY: Glucose-Capillary: 289 mg/dL — ABNORMAL HIGH (ref 70–99)

## 2023-10-21 MED ORDER — METFORMIN HCL 1000 MG PO TABS
1000.0000 mg | ORAL_TABLET | Freq: Two times a day (BID) | ORAL | 11 refills | Status: AC
  Filled 2023-10-21: qty 60, 30d supply, fill #0
  Filled 2023-11-18: qty 60, 30d supply, fill #1
  Filled 2023-12-25: qty 60, 30d supply, fill #2
  Filled 2024-01-19: qty 60, 30d supply, fill #3
  Filled 2024-02-16: qty 60, 30d supply, fill #4
  Filled 2024-03-22 (×2): qty 60, 30d supply, fill #5
  Filled 2024-04-19: qty 60, 30d supply, fill #6
  Filled 2024-05-21: qty 60, 30d supply, fill #7
  Filled 2024-06-22: qty 60, 30d supply, fill #8
  Filled 2024-07-23: qty 60, 30d supply, fill #9

## 2023-10-21 MED ORDER — IRBESARTAN-HYDROCHLOROTHIAZIDE 300-12.5 MG PO TABS
1.0000 | ORAL_TABLET | Freq: Every day | ORAL | 3 refills | Status: AC
Start: 2023-10-21 — End: ?
  Filled 2023-10-21: qty 30, 30d supply, fill #0
  Filled 2023-11-18: qty 30, 30d supply, fill #1
  Filled 2023-12-25: qty 30, 30d supply, fill #2
  Filled 2024-01-19: qty 30, 30d supply, fill #3
  Filled 2024-02-16: qty 30, 30d supply, fill #4
  Filled 2024-03-22: qty 30, 30d supply, fill #5
  Filled 2024-04-19: qty 30, 30d supply, fill #6
  Filled 2024-05-21: qty 30, 30d supply, fill #7
  Filled 2024-06-22: qty 30, 30d supply, fill #8
  Filled 2024-07-23: qty 30, 30d supply, fill #9

## 2023-10-21 MED ORDER — ROSUVASTATIN CALCIUM 20 MG PO TABS
20.0000 mg | ORAL_TABLET | Freq: Every day | ORAL | 3 refills | Status: AC
  Filled 2023-10-21: qty 30, 30d supply, fill #0
  Filled 2023-11-18: qty 30, 30d supply, fill #1
  Filled 2023-12-25: qty 30, 30d supply, fill #2
  Filled 2024-01-19: qty 30, 30d supply, fill #3
  Filled 2024-02-16: qty 30, 30d supply, fill #4
  Filled 2024-03-22: qty 30, 30d supply, fill #5
  Filled 2024-04-19: qty 30, 30d supply, fill #6
  Filled 2024-05-21: qty 30, 30d supply, fill #7
  Filled 2024-06-22: qty 30, 30d supply, fill #8
  Filled 2024-07-23: qty 30, 30d supply, fill #9

## 2023-10-21 MED ORDER — LIRAGLUTIDE 18 MG/3ML ~~LOC~~ SOPN
1.2000 mg | PEN_INJECTOR | Freq: Every day | SUBCUTANEOUS | 1 refills | Status: DC
  Filled 2023-10-21: qty 6, 30d supply, fill #0

## 2023-10-21 NOTE — Telephone Encounter (Signed)
 Medication no longer on IM program. Also no longer on novo nordisk assistance. Trulicity would be her best option since it is under the free med program for uninsured [facility]) at [facility] pharmacy.

## 2023-10-21 NOTE — Assessment & Plan Note (Addendum)
 Patient's BP 146/100 and 151/101 on repeat. Denies any chest pain, vision changes, abdominal pain. Headache from recent dental procedure. Patient has not taken irbesartan-hydrochlorothiazide for 2-3 days, needs a refill. Denies any issues with the medication. Has been taking BP at home, with pressures in the 120s/70s-80s. Advised patient to bring in her blood pressure cuff to make sure measurements are similar to the ones in the office. Patient agreeable. Will continue on current medication regimen given difference in BP in clinic and at home. Per patient, she was also told at her dentist's office recently that her BP was too high and they had to hold off on a procedure until it was lower. She will likely need an additional antihypertensive agent at her next visit for better BP control. Of note, last BMP from February 2024. Waiting on [facility] card approval to repeat BMP.  Plan - Continue irbesartan-hydrochlorothiazide (AVALIDE) 300-12.5 MG tablet, provided refill today  - Bring in BP cuff to next visit, will likely need to adjust antihypertensive therapy - Future BMP if [facility] card is approved

## 2023-10-21 NOTE — Patient Instructions (Addendum)
 Tonya Pock, [name] por permitirnos brindarle atencin hoy. Hoy hablamos sobre su presion de Tonya Simmons y la diabetes.   He ordenado los siguientes laboratorios para usted:  Lab Orders         Glucose, capillary         POC Hbg A1C      Pruebas ordenadas hoy:  Referencias ordenadas hoy:  Referral Orders  No referral(s) requested today     He ordenado el siguiente medicamento/cambiado los siguientes medicamentos:  Suspender los siguientes medicamentos: Medications Discontinued During This Encounter  Medication Reason   liraglutide (VICTOZA) 18 MG/3ML SOPN Reorder   rosuvastatin (CRESTOR) 20 MG tablet Reorder   irbesartan-hydrochlorothiazide (AVALIDE) 300-12.5 MG tablet Reorder   metFORMIN (GLUCOPHAGE) 1000 MG tablet Reorder     Iniciar los siguientes medicamentos: Meds ordered this encounter  Medications   irbesartan-hydrochlorothiazide (AVALIDE) 300-12.5 MG tablet    Sig: Take 1 tablet by mouth daily.    Dispense:  90 tablet    Refill:  3    IM program   liraglutide (VICTOZA) 18 MG/3ML SOPN    Sig: Inject 1.2 mg into the skin daily.    Dispense:  9 mL    Refill:  1    IM PROGRAM   metFORMIN (GLUCOPHAGE) 1000 MG tablet    Sig: Take 1 tablet (1,000 mg total) by mouth 2 (two) times daily with a meal.    Dispense:  60 tablet    Refill:  11    im   rosuvastatin (CRESTOR) 20 MG tablet    Sig: Take 1 tablet (20 mg total) by mouth daily.    Dispense:  90 tablet    Refill:  3    IM PROGRAM     Seguimiento : 1 mes (one month)    Recordar:   - Favor de llamar a Tonya Simmons para poder entregar la aplicacion para la ayuda medica. Estamos esperando obtener esta ayuda para completar algunos examenes de [place].   - Vamos a contactar a nuestra especialista de la farmacia para que nos ayude obtener Victoza.   - Le mande los medicamentos que ya no tiene a Garment/textile technologist.   - Favor de traer la bombita de presion a la siguiente visita para ver si lo que mide es igual a  la presion que le medimos aqui en la Landscape architect.   - Es muy importante que trate de Advertising account planner o incrementar su actividad ya que el sobrepeso afecta la presion de la [place].   Sus medicamentos:  - Irbesartan-Hydrochlorothiazide: para la presion  - Jardiance: para la diabetes - Victoza: para la diabetes - Metformin: para la diabetes - Rosuvastatin: para el colesterol   Si tiene alguna pregunta o inquietud, llame a la clnica de medicina interna al [phone].    Imojean Yoshino Colbert Coyer, MD PGY-1 Internal Medicine Teaching Program  [facility]

## 2023-10-21 NOTE — Telephone Encounter (Signed)
 Copied from CRM [id number]. Topic: Clinical - Prescription Issue >> Oct 21, 2023  9:58 AM Corin V wrote: Reason for CRM: Allie with the Outpatient Pharmacy stated that victoza is no longer on the IM program and was asking that an Prescription for trulicity be sent to the [facility] medical pharmacy to dispense under the [facility] program.

## 2023-10-21 NOTE — Progress Notes (Addendum)
 Internal Medicine Clinic Attending  Case discussed with the resident at the time of the visit.  We reviewed the resident's history and exam and pertinent patient test results.  I agree with the assessment, diagnosis, and plan of care documented in the resident's note. Out of her medications, restart today.  Reassess HTN, HLD and T2DM at FU visit.

## 2023-10-21 NOTE — Progress Notes (Signed)
 Established Patient Office Visit  Subjective   Patient ID: Marnesha Gagen, female    DOB: 1980-07-23  Age: 43 y.o. MRN: [id number]  Chief Complaint  Patient presents with   Follow-up    Routine office visit with medication refill / [facility]    Patient is a 43 y.o. with a past medical history stated below who presents today for follow-up for hypertension and diabetes. Last clinic visit to [facility] on 06/08/23. Patient is still without [employer], discussed [employer] for assistance, patient agreeable. She has been without Victoza for a couple of months and without her Irbesartan-hydrochlorothiazide, Metformin, and Crestor for 2-3 days and needs a refill. She recently received Jardiance via mail pharmacy and has been taking it consistently. No acute problems today. Please see problem based assessment and plan for additional details.     Past Medical History:  Diagnosis Date   Diabetes mellitus without complication (HCC)    Hepatic steatosis    History of deep vein thrombosis (DVT) of lower extremity    Hypertension    Hypertension in pregnancy, antepartum 12/11/2011   Borderline dx: no antenatal testing at this time (01/23/12)    Review of Systems  Respiratory:  Negative for cough.   Cardiovascular:  Negative for chest pain, palpitations and leg swelling.  Gastrointestinal:  Negative for abdominal pain, diarrhea, nausea and vomiting.  Genitourinary:  Negative for urgency.  Neurological:  Positive for headaches.       Headache related to recent dental cleaning/procedure.      Objective:     BP (!) 151/101 (BP Location: Left Arm, Patient Position: Sitting, Cuff Size: Large) Comment: patient has not taken any medication this morning  Pulse (!) 107   Temp 98.9 F (37.2 C) (Oral)   Ht 5\' 6"  (1.676 m)   Wt 247 lb 6.4 oz (112.2 kg)   SpO2 99%   BMI 39.93 kg/m  BP Readings from Last 3 Encounters:  10/21/23 (!) 151/101  05/29/23 (!) 138/101  02/05/23 (!) 156/120   Wt  Readings from Last 3 Encounters:  10/21/23 247 lb 6.4 oz (112.2 kg)  05/29/23 239 lb 12.8 oz (108.8 kg)  02/05/23 244 lb 8 oz (110.9 kg)   Physical Exam HENT:     Head: Normocephalic and atraumatic.  Cardiovascular:     Rate and Rhythm: Tachycardia present.     Pulses: Normal pulses.     Heart sounds: Normal heart sounds.  Pulmonary:     Effort: Pulmonary effort is normal.     Breath sounds: Normal breath sounds.  Abdominal:     Palpations: Abdomen is soft.     Tenderness: There is no abdominal tenderness.  Musculoskeletal:        General: No swelling.  Skin:    General: Skin is warm and dry.  Neurological:     General: No focal deficit present.     Mental Status: She is alert.     Results for orders placed or performed in visit on 10/21/23  Glucose, capillary  Result Value Ref Range   Glucose-Capillary 289 (H) 70 - 99 mg/dL  POC Hbg A2Z  Result Value Ref Range   Hemoglobin A1C 8.8 (A) 4.0 - 5.6 %   HbA1c POC (<> result, manual entry)     HbA1c, POC (prediabetic range)     HbA1c, POC (controlled diabetic range)      Last hemoglobin A1c Lab Results  Component Value Date   HGBA1C 8.8 (A) 10/21/2023    The  10-year ASCVD risk score (Arnett DK, et al., 2019) is: 3.3%    Assessment & Plan:   Problem List Items Addressed This Visit     Type 2 diabetes mellitus (HCC) - Primary (Chronic)   Patient's A1c today 8.8% up from 8.18 May 2023. Fasting glucose 289. Her diabetes medications include Metformin 1000 mg BID, Victoza 1.2 mg daily injection, and Jardiance 10 mg daily. Patient has been without Victoza for a couple of months due to cost. She receives Jardiance regularly in the mail. She has been without Metformin for 2-3 days, needs a refill today. Tolerating all meds, denies any abdominal pain, lightheadedness, or nausea/vomiting. Has a slight headache that she says is associated with recent dental procedure, advised to take Tylenol. Of note, patient has gained about  8 pounds since her last visit. Counseled patient on the importance of increased activity and lifestyle modifications. Diabetic foot exam today with intact pedal pulses bilaterally. Patient able to check feet regularly. Noticeable skin peeling, patient denied any itching, advised patient to dry feet well and change out socks frequently.  Plan:  - Patient given Victoza sample today (3 pens), 1.2 mg injections daily. Reached out to [facility] for further patient assistance.  - Refill for Metformin, continue 1000 mg BID with meals  - Continue Jardiance 10 mg daily      Relevant Medications   irbesartan-hydrochlorothiazide (AVALIDE) 300-12.5 MG tablet   liraglutide (VICTOZA) 18 MG/3ML SOPN   metFORMIN (GLUCOPHAGE) 1000 MG tablet   rosuvastatin (CRESTOR) 20 MG tablet   Other Relevant Orders   POC Hbg A1C (Completed)   Hypertension associated with diabetes (HCC) (Chronic)   Patient's BP 146/100 and 151/101 on repeat. Denies any chest pain, vision changes, abdominal pain. Headache from recent dental procedure. Patient has not taken irbesartan-hydrochlorothiazide for 2-3 days, needs a refill. Denies any issues with the medication. Has been taking BP at home, with pressures in the 120s/70s-80s. Advised patient to bring in her blood pressure cuff to make sure measurements are similar to the ones in the office. Patient agreeable. Will continue on current medication regimen given difference in BP in clinic and at home. Per patient, she was also told at her dentist's office recently that her BP was too high and they had to hold off on a procedure until it was lower. She will likely need an additional antihypertensive agent at her next visit for better BP control. Of note, last BMP from February 2024. Waiting on [facility] card approval to repeat BMP.  Plan - Continue irbesartan-hydrochlorothiazide (AVALIDE) 300-12.5 MG tablet, provided refill today  - Bring in BP cuff to next visit, will likely need to adjust  antihypertensive therapy - Future BMP if [employer] card is approved      Relevant Medications   irbesartan-hydrochlorothiazide (AVALIDE) 300-12.5 MG tablet   liraglutide (VICTOZA) 18 MG/3ML SOPN   metFORMIN (GLUCOPHAGE) 1000 MG tablet   rosuvastatin (CRESTOR) 20 MG tablet   Other Visit Diagnoses       Diabetes mellitus without complication (HCC)       Relevant Medications   irbesartan-hydrochlorothiazide (AVALIDE) 300-12.5 MG tablet   liraglutide (VICTOZA) 18 MG/3ML SOPN   metFORMIN (GLUCOPHAGE) 1000 MG tablet   rosuvastatin (CRESTOR) 20 MG tablet     Mixed hyperlipidemia       Relevant Medications   irbesartan-hydrochlorothiazide (AVALIDE) 300-12.5 MG tablet   rosuvastatin (CRESTOR) 20 MG tablet      Patient discussed with Dr. Frances Furbish.  Return in about 4 weeks (around 11/18/2023)  for HTN, diabetes, medication management.   Gudelia Eugene Colbert Coyer, MD

## 2023-10-21 NOTE — Assessment & Plan Note (Signed)
 Patient's A1c today 8.8% up from 8.18 May 2023. Fasting glucose 289. Her diabetes medications include Metformin 1000 mg BID, Victoza 1.2 mg daily injection, and Jardiance 10 mg daily. Patient has been without Victoza for a couple of months due to cost. She receives Jardiance regularly in the mail. She has been without Metformin for 2-3 days, needs a refill today. Tolerating all meds, denies any abdominal pain, lightheadedness, or nausea/vomiting. Has a slight headache that she says is associated with recent dental procedure, advised to take Tylenol. Of note, patient has gained about 8 pounds since her last visit. Counseled patient on the importance of increased activity and lifestyle modifications. Diabetic foot exam today with intact pedal pulses bilaterally. Patient able to check feet regularly. Noticeable skin peeling, patient denied any itching, advised patient to dry feet well and change out socks frequently.  Plan:  - Patient given Victoza sample today (3 pens), 1.2 mg injections daily. Reached out to [facility] for further patient assistance.  - Refill for Metformin, continue 1000 mg BID with meals  - Continue Jardiance 10 mg daily

## 2023-10-28 ENCOUNTER — Other Ambulatory Visit: Payer: Self-pay

## 2023-10-28 ENCOUNTER — Other Ambulatory Visit: Payer: Self-pay | Admitting: Student

## 2023-10-28 MED ORDER — TRULICITY 0.75 MG/0.5ML ~~LOC~~ SOAJ
0.7500 mg | SUBCUTANEOUS | 0 refills | Status: DC
  Filled 2023-10-28 – 2024-01-19 (×2): qty 2, 28d supply, fill #0

## 2023-10-28 NOTE — Progress Notes (Signed)
 Victoza no longer on patient assistance program. Discussed changing therapy to Trulicity over the phone with the patient on 4/14 and patient is agreeable. Will start Trulicity 0.75 mg injections weekly when patient finishes Victoza samples provided at last clinic visit at [facility].   Prscila Arellano Zameza, MD PGY-1, [facility] IMTP

## 2023-11-06 ENCOUNTER — Other Ambulatory Visit: Payer: Self-pay

## 2023-11-18 ENCOUNTER — Other Ambulatory Visit (HOSPITAL_COMMUNITY): Payer: Self-pay

## 2023-11-20 ENCOUNTER — Ambulatory Visit: Payer: Self-pay | Admitting: Student

## 2023-11-20 VITALS — BP 115/82 | HR 103 | Temp 98.5°F | Ht 66.0 in | Wt 224.1 lb

## 2023-11-20 DIAGNOSIS — I152 Hypertension secondary to endocrine disorders: Secondary | ICD-10-CM

## 2023-11-20 DIAGNOSIS — Z794 Long term (current) use of insulin: Secondary | ICD-10-CM

## 2023-11-20 DIAGNOSIS — E1159 Type 2 diabetes mellitus with other circulatory complications: Secondary | ICD-10-CM

## 2023-11-20 DIAGNOSIS — E1169 Type 2 diabetes mellitus with other specified complication: Secondary | ICD-10-CM

## 2023-11-20 DIAGNOSIS — Z7984 Long term (current) use of oral hypoglycemic drugs: Secondary | ICD-10-CM

## 2023-11-20 NOTE — Patient Instructions (Signed)
 Loise Rinks, Ms.Tylene Espinoza-Palomino por permitirnos brindarle atencin hoy.     Seguimiento 1 mes    Recuerde:    Siga tomando sus remedios. Asegurese de tomar agua y de tomar precauciones cuando usted este cambiando de posicion. Sientese un rato si esta hechada hasta que se le pase el mareo, asegurese de tener algo cerca para agarrarse si es que se marea al levantarse. Si es que empoeran sus simptomas llamenos para que le demos otro remedio que solamente contenga un remedio para la presion.    Si tiene alguna pregunta o inquietud, llame a la clnica de medicina interna al [phone].     Jose Ngo, MD [facility]

## 2023-11-20 NOTE — Progress Notes (Signed)
 CC:  Chief Complaint  Patient presents with   Follow-up    FOLLOW UP BP   HPI:  [name] is a 43 y.o. female living with a history stated below and presents today for the above. Please see problem based assessment and plan for additional details.  Past Medical History:  Diagnosis Date   Diabetes mellitus without complication (HCC)    Hepatic steatosis    History of deep vein thrombosis (DVT) of lower extremity    Hypertension    Hypertension in pregnancy, antepartum 12/11/2011   Borderline dx: no antenatal testing at this time (01/23/12)    Current Outpatient Medications on File Prior to Visit  Medication Sig Dispense Refill   Dulaglutide  (TRULICITY ) 0.75 MG/0.5ML SOAJ Inject 0.75 mg into the skin once a week. 2 mL 0   Insulin  Pen Needle (UNIFINE PENTIPS) 32G X 4 MM MISC Use to inject victoza  daily 400 each 1   irbesartan -hydrochlorothiazide  (AVALIDE) 300-12.5 MG tablet Take 1 tablet by mouth daily. 90 tablet 3   JARDIANCE  10 MG TABS tablet TAKE ONE TABLET BY MOUTH DAILY 90 tablet 2   metFORMIN  (GLUCOPHAGE ) 1000 MG tablet Take 1 tablet (1,000 mg total) by mouth 2 (two) times daily with a meal. 60 tablet 11   rosuvastatin  (CRESTOR ) 20 MG tablet Take 1 tablet (20 mg total) by mouth daily. 90 tablet 3   No current facility-administered medications on file prior to visit.    Family History  Problem Relation Age of Onset   Hypertension Father    Stroke Father    Osteoporosis Mother     Social History   Socioeconomic History   Marital status: Married    Spouse name: Not on file   Number of children: Not on file   Years of education: Not on file   Highest education level: Not on file  Occupational History   Not on file  Tobacco Use   Smoking status: Never   Smokeless tobacco: Never  Substance and Sexual Activity   Alcohol use: No   Drug use: No   Sexual activity: Yes    Birth control/protection: None  Other Topics Concern   Not on file  Social  History Narrative   Not on file   Social Drivers of Health   Financial Resource Strain: Low Risk  (09/11/2022)   Overall Financial Resource Strain (CARDIA)    Difficulty of Paying Living Expenses: Not hard at all  Food Insecurity: No Food Insecurity (09/11/2022)   Hunger Vital Sign    Worried About Running Out of Food in the Last Year: Never true    Ran Out of Food in the Last Year: Never true  Transportation Needs: No Transportation Needs (09/11/2022)   PRAPARE - Transportation    Lack of Transportation (Medical): No    Lack of Transportation (Non-Medical): No  Physical Activity: Insufficiently Active (09/11/2022)   Exercise Vital Sign    Days of Exercise per Week: 3 days    Minutes of Exercise per Session: 30 min  Stress: No Stress Concern Present (09/11/2022)   [employer] of Occupational Health - Occupational Stress Questionnaire    Feeling of Stress : Not at all  Social Connections: Moderately Integrated (09/11/2022)   Social Connection and Isolation Panel [NHANES]    Frequency of Communication with Friends and Family: More than three times a week    Frequency of Social Gatherings with Friends and Family: More than three times a week    Attends Religious Services: More  than 4 times per year    Active Member of Clubs or Organizations: No    Attends Banker Meetings: Never    Marital Status: Married  Catering manager Violence: Not At Risk (09/11/2022)   Humiliation, Afraid, Rape, and Kick questionnaire    Fear of Current or Ex-Partner: No    Emotionally Abused: No    Physically Abused: No    Sexually Abused: No    Review of Systems: ROS negative except for what is noted on the assessment and plan.  Vitals:   11/20/23 0952  BP: 115/82  Pulse: (!) 103  Temp: 98.5 F (36.9 C)  TempSrc: Oral  SpO2: 100%  Weight: 224 lb 1.6 oz (101.7 kg)  Height: 5\' 6"  (1.676 m)    Physical Exam: Constitutional: well-appearing, in NAD HENT: normocephalic atraumatic,  mucous membranes moist Eyes: conjunctiva non-erythematous Cardiovascular: regular rate and rhythm, no m/r/g Pulmonary/Chest: normal work of breathing on room air, lungs clear to auscultation bilaterally Abdominal: soft, non-tender, non-distended MSK: normal bulk and tone Neurological: alert & oriented x 3, no focal deficit Skin: warm and dry Psych: normal mood and behavior  Assessment & Plan:   Patient discussed with Dr. Lanetta Pion  Hypertension associated with diabetes [facility]) Patient comes in to follow-up on her hypertension.  She is on Avalide 300-12.5 mg as directed to bring her blood pressure cuff.  She brought her blood pressure cuff to her visit today.  Her readings are consistent with the arm blood pressure cuff.  Prior to seeing me her blood pressure was 115/82 but on remeasurement blood pressure was 152/108.  This was consistent with her blood pressure cuff.  Measured her blood pressure at home and her readings have ranged on the systolic blood pressure around 113-144. She states that whenever she is working outside and is on the ground she will get dizzy whenever she tries to get on her feet.  She does feel flushed in the room may spin, and she may be having some vision changes.  Decision making was made to continue taking this medication and drink water and follow-up in a month since her blood pressures have been over 140s at times.  She does note she does not drink enough water and try to drink more water this month.  Orthostatic hypotension symptoms were reviewed with the patient, and I gave her tips to prevent her from falling.  She understands this risk.  Type 2 diabetes mellitus (HCC) Hemoglobin A1c in April was 8.8%.  She is taking metformin  1000 mg twice a day, Victoza  1.2 mg daily injections, and Jardiance  10mg  daily.  She was given Victoza  samples at her last visit since she is uninsured.  She got patient assistance with transition to Trulicity  0.75 mg weekly injections.  She has  not picked up this medication as she says that she still has some of the Victoza  leftover.  When she runs out she will go pick up this medication.  Of note she still has not applied for the orange card.  She does have Tonya Simmons's information to reach out to him and apply for insurance.  They have not been able to get a BMP or check kidney function because of this.   Continue with Victoza1.2mg  dialy  until samples run out, then switch to Trulicity  0.75 mg weekly injections Continue with the 1000 mg of metformin  twice daily Continue with 10mg  of Jardiance  daily BMP at next visit and urine microalbumin ratio if patient is amenable to pay out-of-pocket  for has applied for insurance.   Jose Ngo, MD [facility] Internal Medicine, PGY-1 Phone: [phone] Date 11/20/2023 Time 11:46 AM

## 2023-11-20 NOTE — Assessment & Plan Note (Signed)
 Patient comes in to follow-up on her hypertension.  She is on Avalide 300-12.5 mg as directed to bring her blood pressure cuff.  She brought her blood pressure cuff to her visit today.  Her readings are consistent with the arm blood pressure cuff.  Prior to seeing me her blood pressure was 115/82 but on remeasurement blood pressure was 152/108.  This was consistent with her blood pressure cuff.  Measured her blood pressure at home and her readings have ranged on the systolic blood pressure around 113-144. She states that whenever she is working outside and is on the ground she will get dizzy whenever she tries to get on her feet.  She does feel flushed in the room may spin, and she may be having some vision changes.  Decision making was made to continue taking this medication and drink water and follow-up in a month since her blood pressures have been over 140s at times.  She does note she does not drink enough water and try to drink more water this month.  Orthostatic hypotension symptoms were reviewed with the patient, and I gave her tips to prevent her from falling.  She understands this risk.

## 2023-11-20 NOTE — Assessment & Plan Note (Signed)
 Hemoglobin A1c in April was 8.8%.  She is taking metformin  1000 mg twice a day, Victoza  1.2 mg daily injections, and Jardiance  10mg  daily.  She was given Victoza  samples at her last visit since she is uninsured.  She got patient assistance with transition to Trulicity  0.75 mg weekly injections.  She has not picked up this medication as she says that she still has some of the Victoza  leftover.  When she runs out she will go pick up this medication.  Of note she still has not applied for the orange card.  She does have Carlos's information to reach out to him and apply for insurance.  They have not been able to get a BMP or check kidney function because of this.   Continue with Victoza1.2mg  dialy  until samples run out, then switch to Trulicity  0.75 mg weekly injections Continue with the 1000 mg of metformin  twice daily Continue with 10mg  of Jardiance  daily BMP at next visit and urine microalbumin ratio if patient is amenable to pay out-of-pocket for has applied for insurance.

## 2023-11-21 NOTE — Progress Notes (Signed)
 [facility] Clinic Attending  Case discussed with the resident at the time of the visit.  We reviewed the resident's history and exam and pertinent patient test results.  I agree with the assessment, diagnosis, and plan of care documented in the resident's note.

## 2023-12-25 ENCOUNTER — Other Ambulatory Visit (HOSPITAL_COMMUNITY): Payer: Self-pay

## 2023-12-26 ENCOUNTER — Other Ambulatory Visit (HOSPITAL_COMMUNITY): Payer: Self-pay

## 2024-01-19 ENCOUNTER — Other Ambulatory Visit: Payer: Self-pay

## 2024-02-16 ENCOUNTER — Other Ambulatory Visit: Payer: Self-pay

## 2024-02-17 ENCOUNTER — Other Ambulatory Visit: Payer: Self-pay

## 2024-02-17 MED ORDER — TRULICITY 0.75 MG/0.5ML ~~LOC~~ SOAJ
0.7500 mg | SUBCUTANEOUS | 0 refills | Status: AC
  Filled 2024-02-17: qty 2, 28d supply, fill #0

## 2024-02-24 ENCOUNTER — Other Ambulatory Visit: Payer: Self-pay

## 2024-03-22 ENCOUNTER — Other Ambulatory Visit: Payer: Self-pay

## 2024-03-24 ENCOUNTER — Other Ambulatory Visit: Payer: Self-pay

## 2024-03-26 ENCOUNTER — Telehealth: Payer: Self-pay

## 2024-03-26 ENCOUNTER — Other Ambulatory Visit (HOSPITAL_COMMUNITY): Payer: Self-pay

## 2024-03-26 NOTE — Progress Notes (Signed)
 Pharmacy Medication Assistance Program Note    04/20/2024  Patient ID: Tonya Simmons, female  DOB: 08-Jun-1981, 43 y.o.  MRN:  [id number]     03/26/2024  Outreach Medication Two  Manufacturer Medication Two [employer]  [employer] [facility] Jardiance   Type of Radiographer, therapeutic Assistance  Date Application Sent to Patient 03/31/2024  Application Items Requested Application;Proof of Income  Date Application Received From Patient 04/20/2024  Application Items Received From Patient Application     Renewal - in process of completing pcp pages (patient pages rec'd).

## 2024-04-19 ENCOUNTER — Other Ambulatory Visit: Payer: Self-pay

## 2024-04-23 ENCOUNTER — Other Ambulatory Visit: Payer: Self-pay

## 2024-05-05 NOTE — Progress Notes (Signed)
 Pharmacy Medication Assistance Program Note    05/05/2024  Patient ID: Tonya Simmons, female  DOB: 1980/08/17, 43 y.o.  MRN:  [id number]     03/26/2024  Outreach Medication Two  Manufacturer Medication Two [employer]  [employer] [facility] Jardiance   Type of Radiographer, therapeutic Assistance  Date Application Sent to Patient 03/31/2024  Application Items Requested Application;Proof of Income  Name of Prescriber Remonia D'Mello  Date Application Received From Patient 04/20/2024  Application Items Received From Patient Application  Date Application Received From Provider 04/28/2024  Method Application Sent to Manufacturer Fax  Date Application Submitted to Manufacturer 05/05/2024    Renewal - submitted

## 2024-05-11 NOTE — Telephone Encounter (Signed)
 PAP:  Patient assistance application for Jardiance  has been approved by PAP Companies: [employer] from 05/07/24 to 05/07/25.  Medication should be delivered to PATIENTS HOME.   For further shipping updates, please contact [employer] ([employer]) at [phone]. Patient ID is: [id number]

## 2024-05-21 ENCOUNTER — Other Ambulatory Visit: Payer: Self-pay

## 2024-05-26 ENCOUNTER — Other Ambulatory Visit: Payer: Self-pay

## 2024-06-22 ENCOUNTER — Other Ambulatory Visit: Payer: Self-pay

## 2024-07-23 ENCOUNTER — Other Ambulatory Visit: Payer: Self-pay

## 2024-07-27 ENCOUNTER — Other Ambulatory Visit: Payer: Self-pay

## 2024-08-09 ENCOUNTER — Other Ambulatory Visit: Payer: Self-pay
